# Patient Record
Sex: Female | Born: 1990 | Race: White | Hispanic: No | Marital: Single | Smoking: Never smoker
Health system: Southern US, Community
[De-identification: ages and names within clinical notes are randomized; demographics above are authoritative.]

## PROBLEM LIST (undated history)

## (undated) DIAGNOSIS — F419 Anxiety disorder, unspecified: Secondary | ICD-10-CM

## (undated) DIAGNOSIS — R001 Bradycardia, unspecified: Secondary | ICD-10-CM

## (undated) DIAGNOSIS — F32A Depression, unspecified: Secondary | ICD-10-CM

## (undated) DIAGNOSIS — F329 Major depressive disorder, single episode, unspecified: Secondary | ICD-10-CM

## (undated) DIAGNOSIS — T1491XA Suicide attempt, initial encounter: Secondary | ICD-10-CM

## (undated) DIAGNOSIS — E039 Hypothyroidism, unspecified: Principal | ICD-10-CM

## (undated) DIAGNOSIS — R42 Dizziness and giddiness: Secondary | ICD-10-CM

## (undated) DIAGNOSIS — L700 Acne vulgaris: Principal | ICD-10-CM

## (undated) DIAGNOSIS — E063 Autoimmune thyroiditis: Secondary | ICD-10-CM

## (undated) DIAGNOSIS — Z789 Other specified health status: Secondary | ICD-10-CM

## (undated) DIAGNOSIS — G43809 Other migraine, not intractable, without status migrainosus: Secondary | ICD-10-CM

## (undated) DIAGNOSIS — H9190 Unspecified hearing loss, unspecified ear: Secondary | ICD-10-CM

## (undated) DIAGNOSIS — Z0000 Encounter for general adult medical examination without abnormal findings: Principal | ICD-10-CM

## (undated) DIAGNOSIS — T753XXA Motion sickness, initial encounter: Principal | ICD-10-CM

## (undated) DIAGNOSIS — M25572 Pain in left ankle and joints of left foot: Secondary | ICD-10-CM

## (undated) DIAGNOSIS — R599 Enlarged lymph nodes, unspecified: Secondary | ICD-10-CM

## (undated) DIAGNOSIS — E7800 Pure hypercholesterolemia, unspecified: Secondary | ICD-10-CM

## (undated) DIAGNOSIS — R739 Hyperglycemia, unspecified: Secondary | ICD-10-CM

## (undated) DIAGNOSIS — R198 Other specified symptoms and signs involving the digestive system and abdomen: Principal | ICD-10-CM

## (undated) HISTORY — DX: Suicide attempt, initial encounter: T14.91XA

## (undated) HISTORY — PX: WISDOM TOOTH EXTRACTION: SHX21

---
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
## 2012-04-01 ENCOUNTER — Inpatient Hospital Stay: Admit: 2012-04-01 | Discharge: 2012-04-01 | Attending: Emergency Medicine

## 2012-04-01 LAB — BASIC METABOLIC PANEL
BUN: 15 mg/dL (ref 7–20)
CO2: 22 mmol/L (ref 21–32)
Calcium: 8.9 mg/dL (ref 8.3–10.6)
Chloride: 100 mmol/L (ref 99–110)
Creatinine: 0.8 mg/dL (ref 0.6–1.1)
GFR African American: >60 (ref >60)
GFR Non-African American: >60 (ref >60)
Glucose: 133 mg/dL — ABNORMAL HIGH (ref 70–99)
Potassium: 3.6 mmol/L (ref 3.5–5.1)
Sodium: 137 mmol/L (ref 136–145)

## 2012-04-01 LAB — CBC WITH AUTO DIFFERENTIAL
Basophils %: 0.4 %
Basophils Absolute: 0 10*3/uL (ref 0.0–0.2)
Eosinophils %: 0.3 %
Eosinophils Absolute: 0 10*3/uL (ref 0.0–0.6)
Hematocrit: 44.2 % (ref 36.0–48.0)
Hemoglobin: 14.6 g/dL (ref 12.0–16.0)
Lymphocytes %: 3.5 %
Lymphocytes Absolute: 0.4 10*3/uL — ABNORMAL LOW (ref 1.0–5.1)
MCH: 29.1 pg (ref 26.0–34.0)
MCHC: 33 g/dL (ref 31.0–36.0)
MCV: 88.3 fL (ref 80.0–100.0)
MPV: 9.4 fL (ref 5.0–10.5)
Monocytes %: 3.7 %
Monocytes Absolute: 0.4 10*3/uL (ref 0.0–1.3)
Neutrophils %: 92.1 %
Neutrophils Absolute: 11.1 10*3/uL — ABNORMAL HIGH (ref 1.7–7.7)
Platelets: 218 10*3/uL (ref 135–450)
RBC: 5 M/uL (ref 4.00–5.20)
RDW: 12.8 % (ref 12.4–15.4)
WBC: 12.1 10*3/uL — ABNORMAL HIGH (ref 4.0–11.0)

## 2012-04-01 LAB — URINALYSIS
Blood, Urine: NEGATIVE
Glucose, Ur: NEGATIVE mg/dL
Ketones, Urine: 40 mg/dL — AB
Nitrite, Urine: NEGATIVE
Specific Gravity, UA: >=1.03 (ref 1.005–1.030)
Urobilinogen, Urine: 0.2 E.U./dL (ref <2.0)
pH, UA: 5.5 (ref 5.0–8.0)

## 2012-04-01 LAB — MICROSCOPIC URINALYSIS: RBC, UA: NONE SEEN /HPF (ref 0–2)

## 2012-04-01 LAB — HEPATIC FUNCTION PANEL
ALT: 13 U/L (ref 10–40)
AST: 14 U/L — ABNORMAL LOW (ref 15–37)
Albumin: 4.4 g/dL (ref 3.4–5.0)
Alkaline Phosphatase: 51 U/L (ref 40–129)
Bilirubin, Direct: 0.1 mg/dL (ref 0.0–0.3)
Bilirubin, Indirect: 0.3 mg/dL (ref 0.0–1.0)
Total Bilirubin: 0.4 mg/dL (ref 0.0–1.0)
Total Protein: 7.8 g/dL (ref 6.4–8.2)

## 2012-04-01 LAB — PREGNANCY, URINE: HCG(Urine) Pregnancy Test: NEGATIVE

## 2012-04-01 LAB — LIPASE: Lipase: 18 U/L (ref 13.0–60.0)

## 2012-04-01 MED ORDER — ONDANSETRON HCL 4 MG PO TABS
4 MG | ORAL_TABLET | Freq: Four times a day (QID) | ORAL | Status: DC | PRN
Start: 2012-04-01 — End: 2019-07-14

## 2012-04-01 MED ADMIN — ondansetron (ZOFRAN) injection 4 mg: 4 mg | INTRAVENOUS | @ 20:00:00 | NDC 00641607801

## 2012-04-01 MED ADMIN — 0.9 % sodium chloride bolus: 1000 mL | INTRAVENOUS | @ 20:00:00 | NDC 00264780000

## 2012-04-01 MED FILL — ONDANSETRON HCL 4 MG/2ML IJ SOLN: 4 MG/2ML | INTRAMUSCULAR | Qty: 2

## 2012-04-01 NOTE — ED Notes (Signed)
Cut to patient for urine specimen, not enough on first void for culture.    Dena Billet, RN  04/01/12 [phone]

## 2012-04-01 NOTE — ED Notes (Signed)
Fluids to patient, told to take slow sips to make sure she can keep down fluids     Kathie Dike, RN  04/01/12 [phone]

## 2012-04-01 NOTE — Discharge Instructions (Signed)
B.R.A.T. Diet  Your doctor has recommended the B.R.A.T. diet for you or your child until the condition improves. This is often used to help control diarrhea and vomiting symptoms. If you or your child can tolerate clear liquids, you may have:   Bananas.   Rice.   Applesauce.   Toast (and other simple starches such as crackers, potatoes, noodles).  Be sure to avoid dairy products, meats, and fatty foods until symptoms are better. Fruit juices such as apple, grape, and prune juice can make diarrhea worse. Avoid these. Continue this diet for 2 days or as instructed by your caregiver.  Document Released: 12/23/2004 Document Revised: 03/17/2011 Document Reviewed: 06/11/2006  [facility] Patient Information 2013 [place].    Diarrhea  Infections caused by germs (bacterial) or a virus commonly cause diarrhea. Your caregiver has determined that with time, rest and fluids, the diarrhea should improve. In general, eat normally while drinking more water than usual. Although water may prevent dehydration, it does not contain salt and minerals (electrolytes). Broths, weak tea without caffeine and oral rehydration solutions (ORS) replace fluids and electrolytes.  Small amounts of fluids should be taken frequently. Large amounts at one time may not be tolerated. Plain water may be harmful in infants and the elderly. Oral rehydrating solutions (ORS) are available at pharmacies and grocery stores. ORS replace water and important electrolytes in proper proportions. Sports drinks are not as effective as ORS and may be harmful due to sugars worsening diarrhea.   ORS is especially recommended for use in children with diarrhea. As a general guideline for children, replace any new fluid losses from diarrhea and/or vomiting with ORS as follows:   If your child weighs 22 pounds or under (10 kg or less), give 60-120 mL ( -  cup or 2 - 4 ounces) of ORS for each episode of diarrheal stool or vomiting episode.   If your child  weighs more than 22 pounds (more than 10 kgs), give 120-240 mL ( - 1 cup or 4 - 8 ounces) of ORS for each diarrheal stool or episode of vomiting.   While correcting for dehydration, children should eat normally. However, foods high in sugar should be avoided because this may worsen diarrhea. Large amounts of carbonated soft drinks, juice, gelatin desserts and other highly sugared drinks should be avoided.   After correction of dehydration, other liquids that are appealing to the child may be added. Children should drink small amounts of fluids frequently and fluids should be increased as tolerated. Children should drink enough fluids to keep urine clear or pale yellow.   Adults should eat normally while drinking more fluids than usual. Drink small amounts of fluids frequently and increase as tolerated. Drink enough fluids to keep urine clear or pale yellow. Broths, weak decaffeinated tea, lemon lime soft drinks (allowed to go flat) and ORS replace fluids and electrolytes.   Avoid:   Carbonated drinks.   Juice.   Extremely hot or cold fluids.   Caffeine drinks.   Fatty, greasy foods.   Alcohol.   Tobacco.   Too much intake of anything at one time.   Gelatin desserts.   Probiotics are active cultures of beneficial bacteria. They may lessen the amount and number of diarrheal stools in adults. Probiotics can be found in yogurt with active cultures and in supplements.   Wash hands well to avoid spreading bacteria and virus.   Anti-diarrheal medications are not recommended for infants and children.   Only take over-the-counter or  prescription medicines for pain, discomfort or fever as directed by your caregiver. Do not give aspirin to children because it may cause Reye's Syndrome.   For adults, ask your caregiver if you should continue all prescribed and over-the-counter medicines.   If your caregiver has given you a follow-up appointment, it is very important to keep that appointment. Not keeping the  appointment could result in a chronic or permanent injury, and disability. If there is any problem keeping the appointment, you must call back to this facility for assistance.  SEEK IMMEDIATE MEDICAL CARE IF:    You or your child is unable to keep fluids down or other symptoms or problems become worse in spite of treatment.   Vomiting or diarrhea develops and becomes persistent.   There is vomiting of blood or bile (green material).   There is blood in the stool or the stools are black and tarry.   There is no urine output in 6-8 hours or there is only a small amount of very dark urine.   Abdominal pain develops, increases or localizes.   You have a fever.   Your baby is older than 3 months with a rectal temperature of 102 F (38.9 C) or higher.   Your baby is 25 months old or younger with a rectal temperature of 100.4 F (38 C) or higher.   You or your child develops excessive weakness, dizziness, fainting or extreme thirst.   You or your child develops a rash, stiff neck, severe headache or become irritable or sleepy and difficult to awaken.  MAKE SURE YOU:    Understand these instructions.   Will watch your condition.   Will get help right away if you are not doing well or get worse.  Document Released: 12/13/2001 Document Revised: 03/17/2011 Document Reviewed: 10/30/2008  [facility] Patient Information 2013 [place].    Nausea and Vomiting  Nausea is a sick feeling that often comes before throwing up (vomiting). Vomiting is a reflex where stomach contents come out of your mouth. Vomiting can cause severe loss of body fluids (dehydration). Children and elderly adults can become dehydrated quickly, especially if they also have diarrhea. Nausea and vomiting are symptoms of a condition or disease. It is important to find the cause of your symptoms.  CAUSES    Direct irritation of the stomach lining. This irritation can result from increased acid production (gastroesophageal reflux disease),  infection, food poisoning, taking certain medicines (such as nonsteroidal anti-inflammatory drugs), alcohol use, or tobacco use.   Signals from the brain.These signals could be caused by a headache, heat exposure, an inner ear disturbance, increased pressure in the brain from injury, infection, a tumor, or a concussion, pain, emotional stimulus, or metabolic problems.   An obstruction in the gastrointestinal tract (bowel obstruction).   Illnesses such as diabetes, hepatitis, gallbladder problems, appendicitis, kidney problems, cancer, sepsis, atypical symptoms of a heart attack, or eating disorders.   Medical treatments such as chemotherapy and radiation.   Receiving medicine that makes you sleep (general anesthetic) during surgery.  DIAGNOSIS  Your caregiver may ask for tests to be done if the problems do not improve after a few days. Tests may also be done if symptoms are severe or if the reason for the nausea and vomiting is not clear. Tests may include:   Urine tests.   Blood tests.   Stool tests.   Cultures (to look for evidence of infection).   X-rays or other imaging studies.  Test results can  help your caregiver make decisions about treatment or the need for additional tests.  TREATMENT  You need to stay well hydrated. Drink frequently but in small amounts.You may wish to drink water, sports drinks, clear broth, or eat frozen ice pops or gelatin dessert to help stay hydrated.When you eat, eating slowly may help prevent nausea.There are also some antinausea medicines that may help prevent nausea.  HOME CARE INSTRUCTIONS    Take all medicine as directed by your caregiver.   If you do not have an appetite, do not force yourself to eat. However, you must continue to drink fluids.   If you have an appetite, eat a normal diet unless your caregiver tells you differently.   Eat a variety of complex carbohydrates (rice, wheat, potatoes, bread), lean meats, yogurt, fruits, and vegetables.   Avoid  high-fat foods because they are more difficult to digest.   Drink enough water and fluids to keep your urine clear or pale yellow.   If you are dehydrated, ask your caregiver for specific rehydration instructions. Signs of dehydration may include:   Severe thirst.   Dry lips and mouth.   Dizziness.   Dark urine.   Decreasing urine frequency and amount.   Confusion.   Rapid breathing or pulse.  SEEK IMMEDIATE MEDICAL CARE IF:    You have blood or brown flecks (like coffee grounds) in your vomit.   You have black or bloody stools.   You have a severe headache or stiff neck.   You are confused.   You have severe abdominal pain.   You have chest pain or trouble breathing.   You do not urinate at least once every 8 hours.   You develop cold or clammy skin.   You continue to vomit for longer than 24 to 48 hours.   You have a fever.  MAKE SURE YOU:    Understand these instructions.   Will watch your condition.   Will get help right away if you are not doing well or get worse.  Document Released: 12/23/2004 Document Revised: 03/17/2011 Document Reviewed: 05/22/2010  [facility] Patient Information 2013 [place].

## 2012-04-01 NOTE — ED Notes (Signed)
Pt arrives to our ED for vomiting since 11 a.m. This morning. Pt's last period was 2 weeks ago. Pt denies abd pain.     Lockie Mola, RN  04/01/12 [phone]

## 2012-04-01 NOTE — ED Notes (Signed)
Pt has nausea, no further emesis, reports she feel like she need to have a bowel movement.     Kathie Dike, RN  04/01/12 [phone]

## 2012-04-01 NOTE — ED Provider Notes (Signed)
[facility]     eMERGENCY dEPARTMENT eNCOUnter   [facility]      Pt Name: Vickie Wallace  MRN: <[id number]>  Birthdate 10-30-1990  Date of evaluation: 04/01/2012    CHIEF COMPLAINT     Emesis    Nursing Notes, Past Medical Hx, Past Surgical Hx, Social Hx, Allergies, and Family Hx were all reviewed and agreed with, or any disagreements were addressed in the HPI.    HISTORY OF PRESENT ILLNESS     Vickie Wallace is a 22 y.o. female who presents to the emergency department with complaints of vomiting and diarrhea.  Patient states her symptoms began this morning approximately 11 AM.  She states she has not been able to keep anything down.  She reports feeling nauseated and states she has had several episodes of non-bloody, non-bilious emesis and several episodes of watery diarrhea.  She denies having any abdominal pain.  She denies any fevers.  No urinary symptoms.  No sick contacts or recent travel.  No recent antibiotic use.    REVIEW OF SYSTEMS       Constitutional: no fevers  Cardiac: no recurring substernal pressure;    Respiratory: no shortness of breath;   Gastrointestinal: no abdominal pain; + nausea, + vomiting, + diarrhea   Genitourinary: no dysuria; no frequency;     "Remaining review of systems reviewed and negative. I have reviewed the nursing triage documentation and agree unless otherwise noted below."     PAST MEDICAL HISTORY     She has a past medical history of Anxiety.    SURGICAL HISTORY       She has no past surgical history on file.    CURRENT MEDICATIONS       Previous Medications    CITALOPRAM HYDROBROMIDE (CELEXA PO)    Take  by mouth.       ALLERGIES       She  has no known allergies.    FAMILY HISTORY       She has no family status information on file.    She has a family history is not on file.    SOCIAL HISTORY       She reports that she has never smoked. She does not have any smokeless tobacco history on file. She reports that she drinks about 0.6  ounces of alcohol per week. She reports that she does not use illicit drugs.    PHYSICAL EXAM       INITIAL VITALS: BP 127/73   Pulse 95   Temp(Src) 99.4 ??F (37.4 ??C) (Oral)   Resp 18   Ht 5\' 5"  (1.651 m)   Wt 63.504 kg (140 lb)   BMI 23.3 kg/m2   SpO2 100%    Constitutional: Well-developed female actively vomiting.   HENT:  Head is atraumatic. Mucous membranes are moist  Eyes:  Pupils are equal and reactive to light. No scleral icterus or remarkable injection.  Conjunctivae pink.  Neck: Normal range of motion.   Cardiovascular:  Normal heart rate.  Normal rhythm. No murmurs, No rubs. No gallops.  Pulmonary/Chest:  Normal breath sounds. No respiratory distress. No wheezing.   Abdomen:  Soft, No tenderness. There is no guarding or rebound tenderness. Bowel sounds normal. No masses.   Back:  There is no restriction in range of motion. There is no swelling; redness; or CVA tenderness. The spine is non tender.   Extremities:  Normal range of motion. Intact distal pulses symmetric and equal upper  vs lower.  No pedal edema.  No tenderness. Capillary refill is two seconds.  Neurologic:  The patient is awake and alert. No focal central or lateralizing neuro deficits.  Skin:  Warm; Adequate skin turgor. No erythema. No rash  Psychiatric: Affect normal    DIAGNOSTIC RESULTS     RADIOLOGY:   Not Indicated    LABS:   Labs Reviewed   URINALYSIS - Abnormal; Notable for the following:     Clarity, UA SL CLOUDY (*)     Bilirubin Urine SMALL (*)     Ketones, Urine 40 (*)     Protein, UA TRACE (*)     Leukocyte Esterase, Urine SMALL (*)     All other components within normal limits   CBC WITH AUTO DIFFERENTIAL - Abnormal; Notable for the following:     WBC 12.1 (*)     Neutrophils Absolute 11.1 (*)     Lymphocytes Absolute 0.4 (*)     All other components within normal limits   BASIC METABOLIC PANEL - Abnormal; Notable for the following:     Glucose 133 (*)     All other components within normal limits   HEPATIC FUNCTION PANEL -  Abnormal; Notable for the following:     AST 14 (*)     All other components within normal limits   MICROSCOPIC URINALYSIS - Abnormal; Notable for the following:     WBC, UA 10-20 (*)     Bacteria, UA 4+ (*)     All other components within normal limits   URINE CULTURE   PREGNANCY, URINE   LIPASE     EMERGENCY DEPARTMENT COURSE:     4:47 PM Patient sitting up comfortably.  Reports feeling better.  Requesting something to drink.  Provided a po trial.  States her nausea is improved.  She has not had any further episodes of vomiting or diarrhea.  Her abdomen remains soft and non-surgical.  Her tachycardia has resolved IV fluids.  She is noted to have slight leukocytosis and this is likely an acute phase reactant from her vomiting and diarrhea.  I have low suspicion for acute abdominal pathology such as acute appendicitis, cholecystitis, or pancreatitis.  She is advised to follow a bland diet and keep herself well-hydrated.  She is advised to return for any worsening or concerning symptoms.    DIFFERENTIAL DIAGNOSIS/ MDM:     Based on the patient's history, physical exam, and associated clinical data, the patient presents to the emergency room with complaints of vomiting and diarrhea.  Will obtain labs, urinalysis.  Will provide IV fluids, Zofran.      FINAL IMPRESSION:       1. Nausea & vomiting    2. Diarrhea          DISPOSITION/PLAN:     DISPOSITION Decision to Discharge    PATIENT REFERRED TO:  [facility]  [address]  [place] [address]  [phone]            DISCHARGE MEDICATIONS:  New Prescriptions    ONDANSETRON (ZOFRAN) 4 MG TABLET    Take 1 tablet by mouth every 6 hours as needed for Nausea.       (Please note that portions of this note were completed with a voice recognition program.  Efforts were made to edit the dictations but occasionally words are mis-transcribed.)        Valera Castle, MD  04/01/12 [phone]

## 2012-04-01 NOTE — ED Notes (Signed)
Pt resting, no further emesis. Warm blanket for comfort.     Dena Billet, RN  04/01/12 [id number]

## 2016-09-30 DIAGNOSIS — T43222A Poisoning by selective serotonin reuptake inhibitors, intentional self-harm, initial encounter: Secondary | ICD-10-CM

## 2016-09-30 NOTE — Psychotherapy (Signed)
PES Accept Note:    This provider contacted and has accepted Pt for PES transfer pending the following:    -Completion of DPIC monitoring to 4am   -no significant changes in vitals  -no significant mental status changes    Briefly, Ms Macdonnell is a 26 y/o F, hist of depression and anxiety who presents to ED intoxicated and took 10 pills of lexapro in presumed suicide attempt. Reportedly vomited majority of ingestion.    Nicholaus Bloom Psych OD

## 2016-09-30 NOTE — Unmapped (Signed)
ED Attending Attestation Note    Date of service:  09/30/2016     This patient was seen by the resident physician.  I have seen and examined the patient, agree with the workup, evaluation, management and diagnosis. The care plan has been discussed and I concur.  I have reviewed the ECG and concur with the resident's interpretation.    My assessment reveals a 26 y.o. female with intentional drug overdose on SSRI.  Reportedly took 10 tablets, but vomited up 8.  Normal neurological exam and is hemodynamically stable.  After a period of observation, she will require evaluation at [facility] as she is on a 72 hour hold per PD.

## 2016-09-30 NOTE — ED Notes (Signed)
PSW notified for patient in A-1

## 2016-09-30 NOTE — ED Provider Notes (Signed)
[place] [place] ED Note    Date of Service: 09/30/2016    Reason for Visit: Overdose-Intentional      Patient History     HPI:  This is a 26 y.o. female with history of Anxiety and depression who presents with intentional overdose.  Per the patient, she ingested 10 tablets of citalopram 20 mg at approximately 2200 this evening in an attempt at self-harm.  Patient states that she threw up approximately 8 of the tablets within 5 minutes due to nausea.  Patient denies concomitant intoxicants or ingestion.  Patient endorses ongoing suicidal ideation, stating that she would like to leave the hospital as this is really triggering my anxiety.    No past medical history on file.    No past surgical history on file.    Julie Short  has no tobacco, alcohol, and drug history on file.    Patient's Medications    No medications on file       Allergies:   Allergies as of 09/30/2016    (Not on File)       PMH: Nursing notes reviewed   PSH: Nursing notes reviewed   FH: Nursing notes reviewed   MEDS: Nursing notes and chart reviewed     Review of Systems     ROS: A full, ten-point review of systems was performed. Notable findings per HPI. All other pertinent systems reviewed and negative.      Physical Exam     BP (!) 137/93 (BP Location: Left arm, Patient Position: Sitting)   Pulse 71   Temp 97.9 F (36.6 C) (Oral)   Resp 16   SpO2 97%       General:  Tearful, somewhat disheveled adult woman in no acute distress  HEENT:  Normocephalic, atraumatic; extraocular movements intact; moist mucous membranes with evidence of chewing tobacco contained within the lower lip  Neck:  Neck supple, trachea midline  Pulmonary: Normal work of breathing, no respiratory distress  Cardiac:  Regular rate and rhythm with no murmurs, rubs, or gallops   Abdomen:  Soft, nondistended  Musculoskeletal:  Atraumatic exam with no focal swelling or tenderness, no gross deformity, no peripheral clubbing,  cyanosis, or edema  Vascular:  2+ peripheral pulses in bilateral upper and lower extremities   Skin:  Warm and well perfused without rashes or lesions   Neuro:  Alert and interactive, strength and sensation grossly intact  Psych:  Tearful, with active, ongoing suicidal ideation      Diagnostic Studies     Labs:  Lab results reviewed - please see Epic for full details.    Radiology:  No orders to display       EKG:    EKG Interpretation    Interpreted by emergency department physician    Rhythm: normal sinus   Rate: normal  Axis: normal  Ectopy: none  Conduction: normal  ST Segments: no acute change  T Waves: no acute change  Q Waves: none    Clinical Impression: Normal sinus rhythm with no overt ischemic changes or repolarization abnormalities (to include evidence of QTC prolongation), no prior studies available for comparison    Julie Short      Emergency Department Procedures       ED Course and MDM     Julie Short is a 26 y.o. female with a history and presentation as described above in HPI.  The patient was evaluated by myself and the ED Attending Physician, Dr. Ellis Savage. All management and disposition  plans were discussed and agreed upon.    Following a thorough examination, blood work, urine studies, and electrocardiography were ordered to assess for possible manifestation of toxicologic exposure.  The patient was also discussed with the [place] [facility], who recommended that she be observed for approximately 6 hours following the time of exposure to ascertain clinical clearance from any kind of toxicologic pathology.    Patient was subsequently observed in stable condition for an appropriate amount of time pending clinical clearance of her presumed serotonergic toxidrome.    Following both diagnostic and clinical clearance in conjunction with an appropriate period of observation, the patient was suddenly felt to be stable for transfer to facilitate emergent  psychiatric evaluation.  The patient was subsequent discussed with the on-call physician at psychiatric emergency services, Dr. Boone Master, who expressed understanding of and agreement with the patient's need for transfer.      Impression     1. Intentional drug overdose, initial encounter (CMS Dx)    2. Overdose, intentional self-harm, initial encounter (CMS Dx)           Julie Short, M.D., PGY-3   [facility]     Julie Donna, MD  Resident  10/01/16 [phone]

## 2016-09-30 NOTE — Unmapped (Signed)
Patient presents to ED after reports of Intentional overdose, Patient took 10 Lexapro in attempt to harm herself.

## 2016-10-01 ENCOUNTER — Inpatient Hospital Stay
Admit: 2016-10-01 | Discharge: 2016-10-01 | Disposition: A | Discharge status: Home / Self Care | Payer: PRIVATE HEALTH INSURANCE

## 2016-10-01 ENCOUNTER — Inpatient Hospital Stay
Admit: 2016-10-01 | Discharge: 2016-10-01 | Disposition: A | Discharge status: Other Acute Inpatient Hospital | Payer: PRIVATE HEALTH INSURANCE

## 2016-10-01 DIAGNOSIS — F329 Major depressive disorder, single episode, unspecified: Secondary | ICD-10-CM

## 2016-10-01 LAB — URINE DRUG SCREEN WITHOUT CONFIRMATION, STAT
Amphetamine, 500 ng/mL Cutoff: NEGATIVE
Barbiturates UR, 300  ng/mL Cutoff: NEGATIVE
Benzodiazepines UR, 300 ng/mL Cutoff: NEGATIVE
Buprenorphine, 5 ng/mL Cutoff: NEGATIVE
Cocaine UR, 300 ng/mL Cutoff: NEGATIVE
Fentanyl, 2 ng/mL Cutoff: NEGATIVE
Methadone, UR, 300 ng/mL Cutoff: NEGATIVE
Opiates UR, 300 ng/mL Cutoff: NEGATIVE
Oxycodone, 100 ng/mL Cutoff: NEGATIVE
THC UR, 50 ng/mL Cutoff: NEGATIVE
Tricyclic Antidepressants, 300 ng/mL Cutoff: NEGATIVE

## 2016-10-01 LAB — URINALYSIS-MACROSCOPIC W/REFLEX TO MICROSCOPIC
Bilirubin, UA: NEGATIVE
Blood, UA: NEGATIVE
Glucose, UA: NEGATIVE mg/dL
Ketones, UA: NEGATIVE mg/dL
Leukocytes, UA: NEGATIVE
Nitrite, UA: NEGATIVE
Protein, UA: NEGATIVE mg/dL
Specific Gravity, UA: 1.015 (ref 1.005–1.035)
Urobilinogen, UA: 0.2 EU/dL (ref 0.2–1.0)
pH, UA: 6.5 (ref 5.0–8.0)

## 2016-10-01 LAB — BASIC METABOLIC PANEL
Anion Gap: 11 mmol/L (ref 3–16)
BUN: 10 mg/dL (ref 7–25)
CO2: 26 mmol/L (ref 21–33)
Calcium: 9.4 mg/dL (ref 8.6–10.3)
Chloride: 103 mmol/L (ref 98–110)
Creatinine: 0.83 mg/dL (ref 0.60–1.30)
Glucose: 110 mg/dL (ref 70–100)
Osmolality, Calculated: 290 mOsm/kg (ref 278–305)
Potassium: 4 mmol/L (ref 3.5–5.3)
Sodium: 140 mmol/L (ref 133–146)
eGFR AA CKD-EPI: >90 See note.
eGFR NONAA CKD-EPI: >90 See note.

## 2016-10-01 LAB — CBC
Hematocrit: 43.3 % (ref 35.0–45.0)
Hemoglobin: 15.2 g/dL (ref 11.7–15.5)
MCH: 33 pg (ref 27.0–33.0)
MCHC: 35.1 g/dL (ref 32.0–36.0)
MCV: 94 fL (ref 80.0–100.0)
MPV: 8.5 fL (ref 7.5–11.5)
Platelets: 219 10E3/uL (ref 140–400)
RBC: 4.61 10E6/uL (ref 3.80–5.10)
RDW: 13.4 % (ref 11.0–15.0)
WBC: 6.7 10E3/uL (ref 3.8–10.8)

## 2016-10-01 LAB — VENOUS BLOOD GAS, LINE/SYRINGE
%HBO2-Line Draw: 44.1 % (ref 40.0–70.0)
Base Excess-Line Draw: 2 mmol/L (ref <=3.0)
CO2 Content-Line Draw: 30 mmol/L (ref 25–29)
Carboxyhgb-Line Draw: 1.1 % (ref 0.0–2.0)
HCO3-Line Draw: 28 mmol/L (ref 24–28)
Methemoglobin-Line Draw: 0.6 % (ref 0.0–1.5)
PCO2-Line Draw: 50 mm Hg (ref 41–51)
PH-Line Draw: 7.37 (ref 7.32–7.42)
PO2-Line Draw: 28 mm Hg (ref 25–40)
Reduced Hemoglobin-Line Draw: 54.2 % (ref 0.0–5.0)

## 2016-10-01 LAB — ETHANOL, SERUM: Ethanol: 214 mg/dL (ref 0–10)

## 2016-10-01 LAB — HEPATIC FUNCTION PANEL
ALT: 44 U/L (ref 7–52)
AST: 22 U/L (ref 13–39)
Albumin: 4.5 g/dL (ref 3.5–5.7)
Alkaline Phosphatase: 49 U/L (ref 36–125)
Bilirubin, Direct: 0.1 mg/dL (ref 0.0–0.4)
Bilirubin, Indirect: 0.2 mg/dL (ref 0.0–1.1)
Total Bilirubin: 0.3 mg/dL (ref 0.0–1.5)
Total Protein: 7.8 g/dL (ref 6.4–8.9)

## 2016-10-01 LAB — DIFFERENTIAL
Basophils Absolute: 47 /uL (ref 0–200)
Basophils Relative: 0.7 % (ref 0.0–1.0)
Eosinophils Absolute: 201 /uL (ref 15–500)
Eosinophils Relative: 3 % (ref 0.0–8.0)
Lymphocytes Absolute: 1749 /uL (ref 850–3900)
Lymphocytes Relative: 26.1 % (ref 15.0–45.0)
Monocytes Absolute: 436 /uL (ref 200–950)
Monocytes Relative: 6.5 % (ref 0.0–12.0)
Neutrophils Absolute: 4268 /uL (ref 1500–7800)
Neutrophils Relative: 63.7 % (ref 40.0–80.0)
nRBC: 0 /100{WBCs} (ref 0–0)

## 2016-10-01 LAB — MAGNESIUM: Magnesium: 2.2 mg/dL (ref 1.5–2.5)

## 2016-10-01 LAB — LIPASE: Lipase: 27 U/L (ref 4–82)

## 2016-10-01 LAB — SALICYLATE LEVEL: Salicylate Lvl: <3 mg/dL (ref 10–30)

## 2016-10-01 LAB — ACETAMINOPHEN LEVEL: Acetaminophen Level: <10 ug/mL — ABNORMAL LOW (ref 10–30)

## 2016-10-01 LAB — PHOSPHORUS: Phosphorus: 4.3 mg/dL (ref 2.1–4.7)

## 2016-10-01 MED ORDER — ibuprofen (ADVIL,MOTRIN) tablet 800 mg
400 | Freq: Once | ORAL | Status: AC
Start: 2016-10-01 — End: 2016-10-01
  Administered 2016-10-01: 05:00:00 800 mg via ORAL

## 2016-10-01 MED FILL — IBUPROFEN 400 MG TABLET: 400 400 MG | ORAL | Qty: 2

## 2016-10-01 NOTE — Unmapped (Signed)
SW Note: SW met with pt who appears at baseline with no overt behaviors. She states this sucks and she messed up real bad. The pt states she regrets attempting to overdose and immediately threw up after taking the Lexapro. She states she can't put her finger on why she did this, adding it was impulsive. The pt had been with a friend earlier and was drinking. She states she drank about a bottle of wine. She denies SI and contracts for safety. The pt denies any previous attempts or previous thoughts of suicide. There's no one in her family that attempted or completed a suicide. She has been in therapy before in HS for feeling anxious, but  vague about any other reasons.     At this time the pt appears appropriate for discharge. She denies SI and contracts for safety. She will either have he friend [facility] stay with her or she'll stay with [facility]. SW provided resources for suicide prevention and [facility].     There are no further SW needs at this time.

## 2016-10-01 NOTE — Unmapped (Signed)
Collaboration/Consultation:  Initiated contact with [facility] for [facility].  [facility] on their [facility] this [facility].

## 2016-10-01 NOTE — Unmapped (Signed)
[facility] Psychiatric Emergency  Service Evaluation    Reason for Visit/Chief Complaint: Overdose-Intentional      Patient History     HPI Julie Short is a 26 y/o with a history of anxiety and depression, BIB [facility] on EMS to [facility] after drinking ETOH and taking ~10 pills of lexapro and texting her boyfriend about it. She immediately after induced vomiting. Either her friend or boyfriend called police.    On interview pt is linear, goal directed, affect is constricted and appropraite to situation. She regrets taking the pills, stating that she doesn't know why she would do something like that, she has never before and does not plan to harm herself. Remembers being in an small argument with her boyfriend Julie Short, but nothing too serious. No prior suicide or self harm attempts. Does not have access to guns. Lives in [place] by self, but willing to stay with friends living close. Future oriented, plans to go to therapist, who she recently started with, and talk to PCP about increasing her medications. Not particularly close with family, father deceased, mother lives in [place] and they rarely talk. Denies current SI/HI or AVH. M    States that she has been depressed on and off over the past 1-2 years and was started on Lexapro. Recently started seeing therapist through [facility]. Pt is very calm and deneis any hx of BAD symptoms. 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  She typically drinks a glass of wine every other day but last night drank a bottle. She appears to understand the seriousness of this issue but does not seem to think anything is going on that is so bad that would lead her to this behavior. She denies she had an SI leading up to this issue. She did text the BF before she did this, making it likely this was a tool to manipulate him in some way.          PES Triage Screening:  Broset score:             PSS- Safety Screen Score: 1  Suicide Screen: Is patient  expressing suicidal ideations?: No    Is Admission due to self harm?: No    Has Patient Attempted Suicide or Self Harm in past 72 hours?: No    Is Patient experiencing acute agitation, anxiety or insomnia?  : No    Context: stress and intoxication  Location: Altered mental status of mood  Duration: 1 days.  Severity: moderate .  Associated Symptoms: mild.  Modifying Factors: intoxication .  Timing: Constant.    ??  Past Psychiatric History:     Hospitalizations: no.    Past suicide attempts: no.    History of violence: no.  ??  Substance Use History: ETOH use, binge drinks occasionally, not a regular drinker, though perhaps minimizing. Denies THC, Cocaine, Heroin specifically    PMH:       Past Medical History:   Diagnosis Date   ??? Depression    ??? Obsessive-compulsive disorder      I have reviewed the past medical history.  Additional history obtained: yes    Social History:    Work History:  Psychologist, forensic. Not close with famil but has good friends and Bf    Social History     Social History   ??? Marital status: Divorced     Spouse name: N/A   ??? Number of children: N/A   ???  Years of education: N/A     Social History Main Topics   ??? Smoking status: Never Smoker   ??? Smokeless tobacco: Current User     Types: Chew   ??? Alcohol use 1.2 oz/week     2 Glasses of wine per week      Comment: social, every other day    ??? Drug use: Unknown      Comment: denies   ??? Sexual activity: Not Asked     Other Topics Concern   ??? Caffeine Use Yes   ??? Occupational Exposure No   ??? Exercise No   ??? Seat Belt Yes     Social History Narrative   ??? None     I have reviewed the past social history.  Additional history obtained: yes.    Family History:    Family History   Problem Relation Age of Onset   ??? Family history unknown: Yes     I have reviewed the past family history.  Additional history obtained: yes.    Medications:  Previous Medications    No medications on file       Allergies:   Allergies as of 10/01/2016   ??? (No Known Allergies)        Review of Systems     Review of Systems   Constitutional: Negative for activity change, appetite change, weight gain and weight loss.   Eyes: Negative for visual disturbance.   Respiratory: Negative for shortness of breath.    Cardiovascular: Negative for chest pain and palpitations.   Gastrointestinal: Negative for constipation, diarrhea, nausea and vomiting.         Physical Exam/Objective Data     ED Triage Vitals [10/01/16 0501]   Vital Signs Group      Temp 97.9 ??F (36.6 ??C)      Temp Source Oral      Heart Rate 67      Heart Rate Source Automatic;Left      Resp 16      SpO2 97 %      BP 120/79      MAP (mmHg) 89      BP Location Left arm      BP Method Automatic      Patient Position Sitting   SpO2 97 %   O2 Device None (Room air)       Physical Exam    Mental Status Exam:     Gait and Muscle Strength:  Normal and Muscle strength intact  Appearance and Behavior: Calm, Cooperative and Open Historian                                                  Groomed and NL Body Habitus  Speech: NL articulation, prosody, volume and production  Language: Naming intact  Mood: sl irritable, seems mostly frustrated with herself ofr her behavior and inconvenience for being here  Affect: appropriate to situation  Thought Process and Associations: goal directed and no derailment                                                              No  loose associations  Thought Content: no suicidal/homicidal with plans for the future  Abnormal or psychotic thoughts: None  Orientation: person, place, time/date and situation  Memory: recent, remote, and immediate recall intact  Attention and Concentration: intact  Abstraction: Attention and concentration intact  Fund of Knowledge: average  Insight and Judgement: Partial                                      Fair        Labs:    Please see electronic medical record for any tests performed in the ED.    Recent Results (from the past 24 hour(s))   Urinalysis-Macroscopic w/Rfx to Microsco     Collection Time: 09/30/16 11:32 PM   Result Value Ref Range    Color, UA Yellow Yellow,Straw    Clarity, UA Clear Clear    Specific Gravity, UA 1.015 1.005 - 1.035    pH, UA 6.5 5.0 - 8.0    Protein, UA Negative Negative mg/dL    Glucose, UA Negative Negative mg/dL    Ketones, UA Negative Negative mg/dL    Bilirubin, UA Negative Negative    Blood, UA Negative Negative    Nitrite, UA Negative Negative    Urobilinogen, UA 0.2 E.U./dL 0.2 - 1.0 EU/dL    Leukocytes, UA Negative Negative   Urine Drug Screen, STAT    Collection Time: 09/30/16 11:32 PM   Result Value Ref Range    Amphetamine, 500 ng/mL Cutoff Negative Negative    Barbiturates UR, 300  ng/mL Cutoff Negative Negative    Buprenorphine, 5 ng/mL Cutoff Negative Negative    Benzodiazepines UR, 300 ng/mL Cutoff Negative Negative    Cocaine UR, 300 ng/mL Cutoff Negative Negative    Methadone, UR, 300 ng/mL Cutoff Negative Negative    Opiates UR, 300 ng/mL Cutoff Negative Negative    Oxycodone, 100 ng/mL Cutoff Negative Negative    Tricyclic Antidepressants, 300 ng/mL Cutoff Negative Negative    THC UR, 50 ng/mL Cutoff Negative Negative    Fentanyl, 2 ng/mL Cutoff Negative Negative   Acetaminophen level    Collection Time: 09/30/16 11:35 PM   Result Value Ref Range    Acetaminophen Level <10 (L) 10 - 30 ug/mL   Basic metabolic panel    Collection Time: 09/30/16 11:35 PM   Result Value Ref Range    Sodium 140 133 - 146 mmol/L    Potassium 4.0 3.5 - 5.3 mmol/L    Chloride 103 98 - 110 mmol/L    CO2 26 21 - 33 mmol/L    Anion Gap 11 3 - 16 mmol/L    BUN 10 7 - 25 mg/dL    Creatinine 1.61 0.96 - 1.30 mg/dL    Glucose 045 (H) 70 - 100 mg/dL    Calcium 9.4 8.6 - 40.9 mg/dL    Osmolality, Calculated 290 278 - 305 mOsm/kg    eGFR AA CKD-EPI >90 See note.    eGFR NONAA CKD-EPI >90 See note.   CBC    Collection Time: 09/30/16 11:35 PM   Result Value Ref Range    WBC 6.7 3.8 - 10.8 10E3/uL    RBC 4.61 3.80 - 5.10 10E6/uL    Hemoglobin 15.2 11.7 - 15.5 g/dL    Hematocrit  81.1 91.4 - 45.0 %    MCV 94.0 80.0 - 100.0 fL    MCH 33.0 27.0 - 33.0 pg  MCHC 35.1 32.0 - 36.0 g/dL    RDW 16.1 09.6 - 04.5 %    Platelets 219 140 - 400 10E3/uL    MPV 8.5 7.5 - 11.5 fL   Differential    Collection Time: 09/30/16 11:35 PM   Result Value Ref Range    Neutrophils Relative 63.7 40.0 - 80.0 %    Lymphocytes Relative 26.1 15.0 - 45.0 %    Monocytes Relative 6.5 0.0 - 12.0 %    Eosinophils Relative 3.0 0.0 - 8.0 %    Basophils Relative 0.7 0.0 - 1.0 %    nRBC 0 0 - 0 /100 WBC    Neutrophils Absolute 4,268 1,500 - 7,800 /uL    Lymphocytes Absolute 1,749 850 - 3,900 /uL    Monocytes Absolute 436 200 - 950 /uL    Eosinophils Absolute 201 15 - 500 /uL    Basophils Absolute 47 0 - 200 /uL   ETOH, Ethanol Serum    Collection Time: 09/30/16 11:35 PM   Result Value Ref Range    Ethanol 214 (H) 0 - 10 mg/dL   Venous Blood Gas, Line/Syringe    Collection Time: 09/30/16 11:35 PM   Result Value Ref Range    PH-Line Draw 7.37 7.32 - 7.42    PCO2-Line Draw 50 41 - 51 mm Hg    PO2-Line Draw 28 25 - 40 mm Hg    HCO3-Line Draw 28 24 - 28 mmol/L    CO2 Content-Line Draw 30 (H) 25 - 29 mmol/L    Base Excess-Line Draw 2.0 -2.0 - 3.0 mmol/L    %HBO2-Line Draw 44.1 40.0 - 70.0 %    Carboxyhgb-Line Draw 1.1 0.0 - 2.0 %    Methemoglobin-Line Draw 0.6 0.0 - 1.5 %    Reduced Hemoglobin-Line Draw 54.2 (H) 0.0 - 5.0 %   Hepatic Function Panel    Collection Time: 09/30/16 11:35 PM   Result Value Ref Range    Total Bilirubin 0.3 0.0 - 1.5 mg/dL    Bilirubin, Direct 0.1 0.0 - 0.4 mg/dL    AST 22 13 - 39 U/L    ALT 44 7 - 52 U/L    Alkaline Phosphatase 49 36 - 125 U/L    Total Protein 7.8 6.4 - 8.9 g/dL    Albumin 4.5 3.5 - 5.7 g/dL    Bilirubin, Indirect 0.2 0.0 - 1.1 mg/dL   Lipase    Collection Time: 09/30/16 11:35 PM   Result Value Ref Range    Lipase 27 4 - 82 U/L   Magnesium    Collection Time: 09/30/16 11:35 PM   Result Value Ref Range    Magnesium 2.2 1.5 - 2.5 mg/dL   Phosphorus    Collection Time: 09/30/16 11:35 PM    Result Value Ref Range    Phosphorus 4.3 2.1 - 4.7 mg/dL   Salicylate level    Collection Time: 09/30/16 11:35 PM   Result Value Ref Range    Salicylate Lvl <3 (L) 10 - 30 mg/dL       Radiology and EKG:  No results found.    EKG: Please see electronic medical record for any studies performed in the ED.    Emergency Course and Plan     Julie Short is a 26 y.o. female who presented to the emergency department with Overdose-Intentional    who is now regretful, denying suicide attempt but rather way of getting attention. Concern for protracted depressive episode. Pt well connected to PCP  and therapy services, and future oriented. Collateral contacted by [facility] PES reassuring. Safe D/C    Pt was seen by resident, PA, SW and attending, [place], all in agreement for DC. Pt should continue to see PCP for meds and her therapist. It is unclear why she at 48 for the first time had such dramatic behavior, although she was intox on etoh at the time, no hx of mental illness more than occasional depression. She is not manic, not psychotic, denies si/hi or plans at this time. She regretted hr behavior as soon as she did it, intentionally vomiting up the pills. She is future oriented and wanting to get her phone to call work as she is no show at this point this morning.   ??  Diagnosis: MDD, current depressive episode  ??  Primary psychiatric Diagnosis: depression, nos  Other psychiatric Diagnoses: none   Substance Use Diagnoses: ETOh use disorder?  Medical Diagnoses: none      Disposition:      Discharged from the ED. See AVS for prescriptions, followup, and discharge instructions.  No emergency medical condition present at discharge.  Patient not deemed to be an imminent threat of harm to self or others.  Patient has a good safety plan and discharge disposition in place. Protective factors: Support System and Connected to [employer].   Summary of rationale for disposition: fair.    Provider completing note: Clinical Nurse  Specialist, supervised by sachdeva.    Patient was in OTA.     Patient had a completed Statement of Belief during this encounter:yes  , released by attending physician.    Medications given in PES: no.  Medications prescribed for home or inpatient use: no.  Laboratory work ordered: no.  Other diagnostic studies ordered: no.  Old and/or outside medical records reviewed: no.  Collateral information contacted: yes.  Patient's outside provider contacted: no.         Julie Bonito, PA  10/01/16 [phone]

## 2016-10-01 NOTE — Unmapped (Signed)
[facility] Psychiatric Emergency  Service Evaluation    Reason for Visit/Chief Complaint: Overdose-Intentional      Patient History     HPI  Julie Short is a 26 y/o with a history of anxiety and depression, BIB [facility] on EMS to [facility] after drinking ETOH and taking ~10 pills of lexapro and texting her boyfriend about it. She immediately after induced vomiting. Either her friend or boyfriend called police.    On interview pt is linear, goal directed, affect is constricted and appropraite to situation. She regrets taking the pills, stating that she doesn't know why she would do something like that, she has never before and does not plan to harm herself. Remembers being in an small argument with her boyfriend Beverely Pace, but nothing too serious. No prior suicide or self harm attempts. Does not have access to guns. Lives in [place] by self, but willing to stay with friends living close. Future oriented, plans to go to therapist, who she recently started with, and talk to PCP about increasing her medications. Not particularly close with family, father deceased, mother lives in [place] and they rarely talk. Denies current SI/HI or AVH. M    States that she has been depressed on and off over the past 1-2 years and was started on Lexapro. Recently started seeing therapist through [facility].    PES Triage Screening:  Broset score:             PSS- Safety Screen Score: 1  Suicide Screen: Is patient expressing suicidal ideations?: No    Is Admission due to self harm?: No    Has Patient Attempted Suicide or Self Harm in past 72 hours?: No    Is Patient experiencing acute agitation, anxiety or insomnia?  : No    Context: stress and intoxication  Location: Altered mental status of mood  Duration: 1 days.  Severity: moderate .  Associated Symptoms: mild.  Modifying Factors: intoxication .  Timing: Constant.      Past Psychiatric History:     Hospitalizations: no.    Past suicide attempts: no.    History of violence: no.    Substance  Use History: ETOH use, binge drinks occasionally, not a regular drinker, though perhaps minimizing. Denies THC, Cocaine, Heroin specifically    PMH:       Past Medical History:   Diagnosis Date   ??? Depression    ??? Obsessive-compulsive disorder      I have reviewed the past medical history.  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 Marital status: Divorced     Spouse name: N/A   ??? Number of children: N/A   ??? Years of education: N/A     Social History Main Topics   ??? Smoking status: Never Smoker   ??? Smokeless tobacco: Current User     Types: Chew   ??? Alcohol use 1.2 oz/week     2 Glasses of wine per week      Comment: social, every other day    ??? Drug use: Unknown      Comment: denies   ??? Sexual activity: Not Asked  Other Topics Concern   ??? Caffeine Use Yes   ??? Occupational Exposure No   ??? Exercise No   ??? Seat Belt Yes     Social History Narrative   ??? None     I have reviewed the past social history.  Additional history obtained: no.    Family History:    Family History   Problem Relation Age of Onset   ??? Family history unknown: Yes     I have reviewed the past family history.  Additional history obtained: no.    Medications:  Previous Medications    No medications on file       Allergies:   Allergies as of 10/01/2016   ??? (No Known Allergies)       Review of Systems     Review of Systems      Physical Exam/Objective Data     ED Triage Vitals [10/01/16 0501]   Vital Signs Group      Temp 97.9 ??F (36.6 ??C)      Temp Source Oral      Heart Rate 67      Heart Rate Source Automatic;Left      Resp 16      SpO2 97 %      BP 120/79      MAP (mmHg) 89      BP Location Left arm      BP Method Automatic       Patient Position Sitting   SpO2 97 %   O2 Device None (Room air)       Physical Exam    Mental Status Exam:     Gait and Muscle Strength:  Normal and Muscle strength intact  Appearance and Behavior: Calm, Cooperative and Open Historian      Groomed and NL Body Habitus  Speech: NL articulation, prosody, volume and production  Language: Naming intact  Mood: irritable and dysphoric  Affect: appropriate and constricted  Thought Process and Associations: goal directed and no derailment       No loose associations  Thought Content: no suicidal/homicidal with plans for the future  Abnormal or psychotic thoughts: None  Orientation: person, place, time/date and situation  Memory: recent, remote, and immediate recall intact  Attention and Concentration: intact  Abstraction: Attention and concentration intact  Fund of Knowledge: average  Insight and Judgement: Partial     Fair        Labs:    Please see electronic medical record for any tests performed in the ED.    Recent Results (from the past 24 hour(s))   Urinalysis-Macroscopic w/Rfx to Microsco    Collection Time: 09/30/16 11:32 PM   Result Value Ref Range    Color, UA Yellow Yellow,Straw    Clarity, UA Clear Clear    Specific Gravity, UA 1.015 1.005 - 1.035    pH, UA 6.5 5.0 - 8.0    Protein, UA Negative Negative mg/dL    Glucose, UA Negative Negative mg/dL    Ketones, UA Negative Negative mg/dL    Bilirubin, UA Negative Negative    Blood, UA Negative Negative    Nitrite, UA Negative Negative    Urobilinogen, UA 0.2 E.U./dL 0.2 - 1.0 EU/dL    Leukocytes, UA Negative Negative   Urine Drug Screen, STAT    Collection Time: 09/30/16 11:32 PM   Result Value Ref Range    Amphetamine, 500 ng/mL Cutoff Negative Negative    Barbiturates UR, 300  ng/mL Cutoff Negative  Negative    Buprenorphine, 5 ng/mL Cutoff Negative Negative    Benzodiazepines UR, 300 ng/mL Cutoff Negative Negative    Cocaine UR, 300 ng/mL Cutoff Negative Negative    Methadone, UR, 300 ng/mL Cutoff Negative  Negative    Opiates UR, 300 ng/mL Cutoff Negative Negative    Oxycodone, 100 ng/mL Cutoff Negative Negative    Tricyclic Antidepressants, 300 ng/mL Cutoff Negative Negative    THC UR, 50 ng/mL Cutoff Negative Negative    Fentanyl, 2 ng/mL Cutoff Negative Negative   Acetaminophen level    Collection Time: 09/30/16 11:35 PM   Result Value Ref Range    Acetaminophen Level <10 (L) 10 - 30 ug/mL   Basic metabolic panel    Collection Time: 09/30/16 11:35 PM   Result Value Ref Range    Sodium 140 133 - 146 mmol/L    Potassium 4.0 3.5 - 5.3 mmol/L    Chloride 103 98 - 110 mmol/L    CO2 26 21 - 33 mmol/L    Anion Gap 11 3 - 16 mmol/L    BUN 10 7 - 25 mg/dL    Creatinine 1.61 0.96 - 1.30 mg/dL    Glucose 045 (H) 70 - 100 mg/dL    Calcium 9.4 8.6 - 40.9 mg/dL    Osmolality, Calculated 290 278 - 305 mOsm/kg    eGFR AA CKD-EPI >90 See note.    eGFR NONAA CKD-EPI >90 See note.   CBC    Collection Time: 09/30/16 11:35 PM   Result Value Ref Range    WBC 6.7 3.8 - 10.8 10E3/uL    RBC 4.61 3.80 - 5.10 10E6/uL    Hemoglobin 15.2 11.7 - 15.5 g/dL    Hematocrit 81.1 91.4 - 45.0 %    MCV 94.0 80.0 - 100.0 fL    MCH 33.0 27.0 - 33.0 pg    MCHC 35.1 32.0 - 36.0 g/dL    RDW 78.2 95.6 - 21.3 %    Platelets 219 140 - 400 10E3/uL    MPV 8.5 7.5 - 11.5 fL   Differential    Collection Time: 09/30/16 11:35 PM   Result Value Ref Range    Neutrophils Relative 63.7 40.0 - 80.0 %    Lymphocytes Relative 26.1 15.0 - 45.0 %    Monocytes Relative 6.5 0.0 - 12.0 %    Eosinophils Relative 3.0 0.0 - 8.0 %    Basophils Relative 0.7 0.0 - 1.0 %    nRBC 0 0 - 0 /100 WBC    Neutrophils Absolute 4,268 1,500 - 7,800 /uL    Lymphocytes Absolute 1,749 850 - 3,900 /uL    Monocytes Absolute 436 200 - 950 /uL    Eosinophils Absolute 201 15 - 500 /uL    Basophils Absolute 47 0 - 200 /uL   ETOH, Ethanol Serum    Collection Time: 09/30/16 11:35 PM   Result Value Ref Range    Ethanol 214 (H) 0 - 10 mg/dL   Venous Blood Gas, Line/Syringe    Collection Time: 09/30/16 11:35  PM   Result Value Ref Range    PH-Line Draw 7.37 7.32 - 7.42    PCO2-Line Draw 50 41 - 51 mm Hg    PO2-Line Draw 28 25 - 40 mm Hg    HCO3-Line Draw 28 24 - 28 mmol/L    CO2 Content-Line Draw 30 (H) 25 - 29 mmol/L    Base Excess-Line Draw 2.0 -2.0 - 3.0 mmol/L    %HBO2-Line  Draw 44.1 40.0 - 70.0 %    Carboxyhgb-Line Draw 1.1 0.0 - 2.0 %    Methemoglobin-Line Draw 0.6 0.0 - 1.5 %    Reduced Hemoglobin-Line Draw 54.2 (H) 0.0 - 5.0 %   Hepatic Function Panel    Collection Time: 09/30/16 11:35 PM   Result Value Ref Range    Total Bilirubin 0.3 0.0 - 1.5 mg/dL    Bilirubin, Direct 0.1 0.0 - 0.4 mg/dL    AST 22 13 - 39 U/L    ALT 44 7 - 52 U/L    Alkaline Phosphatase 49 36 - 125 U/L    Total Protein 7.8 6.4 - 8.9 g/dL    Albumin 4.5 3.5 - 5.7 g/dL    Bilirubin, Indirect 0.2 0.0 - 1.1 mg/dL   Lipase    Collection Time: 09/30/16 11:35 PM   Result Value Ref Range    Lipase 27 4 - 82 U/L   Magnesium    Collection Time: 09/30/16 11:35 PM   Result Value Ref Range    Magnesium 2.2 1.5 - 2.5 mg/dL   Phosphorus    Collection Time: 09/30/16 11:35 PM   Result Value Ref Range    Phosphorus 4.3 2.1 - 4.7 mg/dL   Salicylate level    Collection Time: 09/30/16 11:35 PM   Result Value Ref Range    Salicylate Lvl <3 (L) 10 - 30 mg/dL       Radiology and EKG:  No results found.    EKG: Please see electronic medical record for any studies performed in the ED.    Emergency Course and Plan     Julie Short is a 26 y.o. female who presented to the emergency department with Overdose-Intentional  who is now regretful, denying suicide attempt but rather way of getting attention. Concern for protracted depressive episode. Pt well connected to PCP and therapy services, and future oriented. Collateral contacted by [facility] PES reassuring. Safe D/C    As pt is an evaluation for suicide attempt, will need to be evaluated by another provider prior to discharge.    Diagnosis: MDD, current depressive episode    Primary psychiatric Diagnosis: MDD,  current depressive episode  Other psychiatric Diagnoses: none   Substance Use Diagnoses: ETOh use disorder?  Medical Diagnoses: none      Disposition: Discharge pending secondary evaluation by provider     Discharge from [facility]  Summary of rationale for disposition: regretful, future oriented, reassuring collateral, good safety plan, connected to services     Provider completing note: Resident, supervised by [facility].    Patient was in OTA.     Patient had a completed Statement of Belief during this encounter:yes  , released by attending physician.    Medications given in PES: no.  Medications prescribed for home or inpatient use: no.  Laboratory work ordered: no.  Other diagnostic studies ordered: yes.  Old and/or outside medical records reviewed: yes.  Collateral information contacted: yes.  Patient's outside provider contacted: no.         Nicholaus Bloom, MD  Resident  10/01/16 [phone]    I evaluated this patient face to face this am and discussed case with overnight resident. I have reviewed  the resident's note and rest of the chart. I have reviewed relevant laboratory and diagnostic information.Agree with evaluation, diagnosis, workup and  Management per resident note .  I have participated in key portions of decision making.            Barrie Lyme  Devin Going, MD  10/01/16 [phone]

## 2016-10-01 NOTE — ED Notes (Signed)
Called [facility] to arrange transportation to [facility]PES. They will arrive at approximatley 0415 hours (a 30 minute wait). I informed [facility] staff the patient has been calm and cooperative and is very anxious to go to PES. I informed the patient of the plan and she asked Why does it have to take so long?Marland Kitchen

## 2016-10-01 NOTE — Unmapped (Signed)
Julie Short was seen in the [facility] prior to transfer to [facility] by [facility] PSW Ernie Avena, LISW. Please review this providers note which has been copied below:        [facility]  Psychiatric Social Worker Assessment Consult Note  ??  ??  Julie Short                                      [id number]  ??  Chief complaint in patient's own words:: I have been stressed about work, and I was home alone and took the pills, I dont know what I was doing and I have to call my work and tell them I am not going to make it in today.   ??  Clinician's description of presenting problem: the patient is a 26 year old Caucasian Female who was brought to [facility] by [employer] and EMS for a Suicide Attempt by Overdose on her prescribed Lexapro. The police officer wrote a Statement of Belief and reported the patient told him that she overdosed on Lexapro to kill herself. It appears that the patient sent a text message and picture message to a female friend saying she was going to take a bunch of pills. The female friend called 911. The patient appears to be intoxicated on alcohol, and she apparently continued to say she was suicidal to the MD. She did not report on what triggered the attempt, and her friend Alvino Chapel reported she had been out having fun with the patient earlier tonight and did not detect any problems or signs of depression just hours before the call to 911.   ??  History:  History of Present Illness: None reported, none in records.   ??  Psychiatric History: She has been taking Lexapro for several years from her PCP. She did not report any counseling or therapy.   ??  Chemical Dependency History:  ??  Chemical Dependency History: +Etoh socially with friends. She did not  ??  Social History, Support System and Current Living Situation: The patient did not report on her early family life. She is a [employer] with some college, but did not get a degree and started working full time. She got Divorced  this past year, has no children and is dating a young man at this time. She lives alone currently and is supported by her female friends that she has known since high school.   ??  Collateral Information: Her friend Delila Pereyra in the lobby and at the bedside. Mal Misty has known the patient since high school, and she received a call from Harmon Pier who told her to come to the ER. Adelina Mings offered to take the patient home with her and keep her safe. She reported her home has no alcohol, no pills to overdose on, is very safe for the patient.   I got Murrell Converse number from [place] [phone]). Alvino Chapel reported that she did NOT call 911. The patient's boyfriend Beverely Pace called 911 after the patient sent him text messages and a Picture of a bunch of pills sitting out.   Alvino Chapel reported she had been with the patient earlier this evening. They went out for Drinks and socializing, and the patient did not mention any depression or SI all night. She did complain about stress at her job. Alvino Chapel said the night was completely normal, there was no warning at all. She was  very surprised when the patient called her a few hours later, crying, saying that Beverely Pace was going to be calling her and telling her she had overdosed.   The patient was unable to explain why she did it to [place].   ??  Mental Status Exam:   ??  Appearance and Behavior  Apparent Age: Appears Actual Age  Eye Contact: Appropriate  Appear/Hygiene: Appears chemically altered  Patient Behaviors: Agitated, Anxious, Cooperative, Demanding, Tearful  Level of Alertness: Alert  ??  Motor / Speech  Motor Activity: Restless  Speech: Logical/coherent, Argumentative  ??  Affect / Thought  Affect: Anxious, Demanding, Hopelessness  Patient's Reported Mood: depressed  Mood congruent with affect?: Yes  Thought Content: Death Wishes, Hopeless, Suicidal Ideation  Thought Processes: Organized  Perception: Appropriate  Perception Assessment: no evidence of psychosis described or  detected.   Intelligence: Average  Insight: fair  ??  Risk Factors/Stress Factors:  ??  Stress Factors  Patient Stress Factors: Financial concerns  Family Stress Factors: Loss of control  Has the patient had any recent losses?: she is divorced this past year. She is dating. Her job is stessfull.   Risk Factors  Assault Risk Assessment: No risk factors present  Self Harm/Suicidal Ideation Plan: she overdosed on her prescribed Lexapro to kill herself.   Previous Self Harm/Suicidal Plans: denied by patient, none in records.   Recent Psychological Experiences: Loss (comment) (Divorce this year. )  Family Suicide History: Unknown  Current Plans of  Homicide or to Harm Another : none reported  Previous Plans of Homicide or to Harm Another: none reported, none in records.   Access to lethal means: No  Risk Factors for Suicide: Misuse/Abuse of Alcohol/Drugs, Mental Health Disorder  Protective Factors: Support System, Connected to [employer], Life Skills, Self Esteem  ??  Telepsychiatry Considerations: Statement of Belief by the [employer]. The patient did take an overdose, so NOT a candidate for Telepsych.   ??  Formulation and Plan: The patient was brought to [facility] by EMS after she told a friend that she overdosed on her prescribed Lexapro. The Emergency planning/management officer signed a Statement of Belief, and despite her report of Emesis she was medically cleared after about 6 hours of observation. She will be sent to [facility] for a Psychiatric Evaluation.   ??  Patient notified of plan: Yes.  Patient reaction to plan: The patient is pretty demanding actually. She is anxious to go to [facility], but it is unclear why. She wants to leave as soon as possible.  Transportation Agent notified of safety needs: Inform Mobile [facility] the patient has been calm and cooperative throughout her stay.   ??

## 2016-10-01 NOTE — ED Notes (Signed)
Pt on left side lying on the stretcher with eyes closed. Breathing easy and unlabored.

## 2016-10-01 NOTE — ED Notes (Signed)
Patient continues to ask about when she will be transferred to [facility]. RN told patient that she would be medically clear after 0400 and would be up for transport after that.

## 2016-10-01 NOTE — Unmapped (Signed)
[facility]  Psychiatric Social Worker Assessment Consult Note      Julie Short    [id number]    Chief complaint in patient's own words:: I have been stressed about work, and I was home alone and took the pills, I dont know what I was doing and I have to call my work and tell them I am not going to make it in today.     Clinician's description of presenting problem: the patient is a 26 year old Caucasian Female who was brought to [facility] by [employer] and EMS for a Suicide Attempt by Overdose on her prescribed Lexapro. The police officer wrote a Statement of Belief and reported the patient told him that she overdosed on Lexapro to kill herself. It appears that the patient sent a text message and picture message to a female friend saying she was going to take a bunch of pills. The female friend called 911. The patient appears to be intoxicated on alcohol, and she apparently continued to say she was suicidal to the MD. She did not report on what triggered the attempt, and her friend Alvino Chapel reported she had been out having fun with the patient earlier tonight and did not detect any problems or signs of depression just hours before the call to 911.     History:  History of Present Illness: None reported, none in records.     Psychiatric History: She has been taking Lexapro for several years from her PCP. She did not report any counseling or therapy.     Chemical Dependency History:    Chemical Dependency History: +Etoh socially with friends. She did not    Social History, Support System and Current Living Situation: The patient did not report on her early family life. She is a [employer] with some college, but did not get a degree and started working full time. She got Divorced this past year, has no children and is dating a young man at this time. She lives alone currently and is supported by her female friends that she has known since high school.     Collateral Information: Her friend  Delila Pereyra in the lobby and at the bedside. Mal Misty has known the patient since high school, and she received a call from Harmon Pier who told her to come to the ER. Adelina Mings offered to take the patient home with her and keep her safe. She reported her home has no alcohol, no pills to overdose on, is very safe for the patient.   I got Murrell Converse number from [place] [phone]). Alvino Chapel reported that she did NOT call 911. The patient's boyfriend Beverely Pace called 911 after the patient sent him text messages and a Picture of a bunch of pills sitting out.   Alvino Chapel reported she had been with the patient earlier this evening. They went out for Drinks and socializing, and the patient did not mention any depression or SI all night. She did complain about stress at her job. Alvino Chapel said the night was completely normal, there was no warning at all. She was very surprised when the patient called her a few hours later, crying, saying that Beverely Pace was going to be calling her and telling her she had overdosed.   The patient was unable to explain why she did it to [place].     Mental Status Exam:     Appearance and Behavior  Apparent Age: Appears Actual Age  Eye Contact: Appropriate  Appear/Hygiene: Appears chemically altered  Patient Behaviors: Agitated, Anxious, Cooperative, Demanding, Tearful  Level of Alertness: Alert    Motor / Speech  Motor Activity: Restless  Speech: Logical/coherent, Argumentative    Affect / Thought  Affect: Anxious, Demanding, Hopelessness  Patient's Reported Mood: depressed  Mood congruent with affect?: Yes  Thought Content: Death Wishes, Hopeless, Suicidal Ideation  Thought Processes: Organized  Perception: Appropriate  Perception Assessment: no evidence of psychosis described or detected.   Intelligence: Average  Insight: fair      Risk Factors/Stress Factors:    Stress Factors  Patient Stress Factors: Financial concerns  Family Stress Factors: Loss of control  Has the patient had any recent losses?:  she is divorced this past year. She is dating. Her job is stessfull.   Risk Factors  Assault Risk Assessment: No risk factors present  Self Harm/Suicidal Ideation Plan: she overdosed on her prescribed Lexapro to kill herself.   Previous Self Harm/Suicidal Plans: denied by patient, none in records.   Recent Psychological Experiences: Loss (comment) (Divorce this year. )  Family Suicide History: Unknown  Current Plans of  Homicide or to Harm Another : none reported  Previous Plans of Homicide or to Harm Another: none reported, none in records.   Access to lethal means: No  Risk Factors for Suicide: Misuse/Abuse of Alcohol/Drugs, Mental Health Disorder  Protective Factors: Support System, Connected to [employer], Customer service manager, Self Esteem      Telepsychiatry Considerations: Statement of Belief by the [employer]. The patient did take an overdose, so NOT a candidate for Telepsych.     Formulation and Plan: The patient was brought to [facility] by EMS after she told a friend that she overdosed on her prescribed Lexapro. The Emergency planning/management officer signed a Statement of Belief, and despite her report of Emesis she was medically cleared after about 6 hours of observation. She will be sent to [facility] for a Psychiatric Evaluation.       Patient notified of plan: Yes.  Patient reaction to plan: The patient is pretty demanding actually. She is anxious to go to [facility], but it is unclear why. She wants to leave as soon as possible.  Transportation Agent notified of safety needs: Inform Mobile Care the patient has been calm and cooperative throughout her stay.

## 2016-10-01 NOTE — ED Notes (Signed)
Patient to [facility] with mobile care. All belongings sent with patient. No additional needs or questions voiced at this time.

## 2016-10-01 NOTE — Unmapped (Signed)
Discharge Instructions After an Episode of Suicide Threats or Actions    Remove all firearms, weapons (of any kind), or any unneeded medicines that could be used. Identify a support person/advocate and attend follow-up mental health appointments with this person. Be direct and talk openly about suicidal thoughts. Allow expression of feelings. Block all inappropriate internet websites and social media. Get help from agencies that specialize in crisis intervention. Create a personalized safety plan.     The following list are suicide prevention resources available 24 hours a day.  National:    Suicide Prevention Lifeline  1.800.273.TALK (8255)  - The Lifeline provides 24/7, free and confidential support for people in distress, prevention and crisis resources for you or your loved ones, and best practices for professionals.    [place]:   Crisis Hotline ([facility])  513.281.CARE (2273)   - 24-hour telephone support services specializing in suicide prevention, crisis situations, and family violence     [facility] ([facility] Mobile Crisis   513.584.8577   [address], [place], [place] [address]  - 24 hour psychiatric emergency mobile crisis unit trained to respond to mental health emergencies.      [place]:  [place] Mobile Crisis Team  1.844.427.4747  - 24 hour psychiatric emergency mobile crisis unit trained to respond to mental health emergencies.     [place]:   [place] 24-Hour Crisis Hotline  513.528.7283 (SAVE)  - Hotline people can call for support if they are experiencing mental health problems    [place]:   [place] Crisis Line  1.877.695.6333  - 24 hour psychiatric emergency mobile crisis unit trained to respond to mental health emergencies.      [place]:  [place] Crisis Hotline  859.331.3292  - 24-hour telephone support services specializing in suicide prevention, crisis situations, and family violence    Crisis Hotline ([facility])  513.281.CARE (2273)   Website: www.[facility].org  - 24-hour telephone support services specializing in suicide prevention, crisis situations, and family violence    [facility] Mobile Crisis  513.584.8577   [address], [place], [place] [address]  - Psychiatric emergency mobile crisis unit trained to respond to mental health emergencies    www.suicidepreventionlifeline.org

## 2016-10-01 NOTE — Unmapped (Signed)
70 White female presents via squad from [employer] after being medically cleared from an overdose of 10 Lexapro pills.  Initially it was reported that pt said she did this in an attempt to harm herself however now pt denies wanting to harm self and states she does not know why she took the overdose.  Pt is abrupt in her answers and wanting to know how long this will take.  Pt admitted to OTA, belongings secured.    Given blanket, shown to a lounge chair for comfort.  Offered food and drink.  Safety checks started.  Will continue to monitor pt.

## 2016-10-01 NOTE — Unmapped (Signed)
Sitting quietly in lounge chair.

## 2016-10-01 NOTE — ED Notes (Signed)
Pt's belongings returned to the Pt. Discharge instructions with follow up went over with the Pt.

## 2017-09-16 ENCOUNTER — Emergency Department (HOSPITAL_COMMUNITY)
Admission: EM | Admit: 2017-09-16 | Discharge: 2017-09-16 | Disposition: A | Discharge status: Home / Self Care | Payer: Self-pay | Attending: Emergency Medicine | Admitting: Emergency Medicine

## 2017-09-16 ENCOUNTER — Other Ambulatory Visit: Payer: Self-pay

## 2017-09-16 ENCOUNTER — Encounter (HOSPITAL_COMMUNITY): Payer: Self-pay | Admitting: Obstetrics and Gynecology

## 2017-09-16 DIAGNOSIS — F419 Anxiety disorder, unspecified: Secondary | ICD-10-CM | POA: Insufficient documentation

## 2017-09-16 DIAGNOSIS — Z5321 Procedure and treatment not carried out due to patient leaving prior to being seen by health care provider: Secondary | ICD-10-CM | POA: Insufficient documentation

## 2017-09-16 DIAGNOSIS — R110 Nausea: Secondary | ICD-10-CM | POA: Insufficient documentation

## 2017-09-16 NOTE — ED Triage Notes (Signed)
Pt reports she was sitting on the couch and yelled at the cat and then began having trouble breathing, shaking, and feeling scared. Pt states nothing really seemed to trigger the episode but that it has happened about once a week.

## 2017-09-17 ENCOUNTER — Encounter (HOSPITAL_COMMUNITY): Payer: Self-pay | Admitting: Emergency Medicine

## 2017-09-17 ENCOUNTER — Emergency Department (HOSPITAL_COMMUNITY)
Admission: EM | Admit: 2017-09-17 | Discharge: 2017-09-18 | Disposition: A | Discharge status: Home / Self Care | Payer: Self-pay | Attending: Emergency Medicine | Admitting: Emergency Medicine

## 2017-09-17 DIAGNOSIS — R42 Dizziness and giddiness: Secondary | ICD-10-CM | POA: Insufficient documentation

## 2017-09-17 DIAGNOSIS — Z79899 Other long term (current) drug therapy: Secondary | ICD-10-CM | POA: Insufficient documentation

## 2017-09-17 DIAGNOSIS — R001 Bradycardia, unspecified: Secondary | ICD-10-CM | POA: Insufficient documentation

## 2017-09-17 LAB — BASIC METABOLIC PANEL
Anion gap: 11 (ref 5–15)
BUN: 13 mg/dL (ref 6–20)
CALCIUM: 9.5 mg/dL (ref 8.9–10.3)
CO2: 23 mmol/L (ref 22–32)
CREATININE: 1 mg/dL (ref 0.44–1.00)
Chloride: 107 mmol/L (ref 98–111)
GFR calc Af Amer: >60 mL/min (ref >60)
GFR calc non Af Amer: >60 mL/min (ref >60)
GLUCOSE: 89 mg/dL (ref 70–99)
Potassium: 3.5 mmol/L (ref 3.5–5.1)
Sodium: 141 mmol/L (ref 135–145)

## 2017-09-17 LAB — URINALYSIS, ROUTINE W REFLEX MICROSCOPIC
BACTERIA UA: NONE SEEN
BILIRUBIN URINE: NEGATIVE
Glucose, UA: NEGATIVE mg/dL
Hgb urine dipstick: NEGATIVE
Ketones, ur: 80 mg/dL — AB
Nitrite: NEGATIVE
Protein, ur: 30 mg/dL — AB
SPECIFIC GRAVITY, URINE: 1.028 (ref 1.005–1.030)
pH: 5 (ref 5.0–8.0)

## 2017-09-17 LAB — CBC WITH DIFFERENTIAL/PLATELET
Basophils Absolute: 0 10*3/uL (ref 0.0–0.1)
Basophils Relative: 0 %
EOS PCT: 3 %
Eosinophils Absolute: 0.2 10*3/uL (ref 0.0–0.7)
HCT: 39.3 % (ref 36.0–46.0)
Hemoglobin: 13.6 g/dL (ref 12.0–15.0)
LYMPHS PCT: 42 %
Lymphs Abs: 2.6 10*3/uL (ref 0.7–4.0)
MCH: 30.4 pg (ref 26.0–34.0)
MCHC: 34.6 g/dL (ref 30.0–36.0)
MCV: 87.7 fL (ref 78.0–100.0)
MONOS PCT: 6 %
Monocytes Absolute: 0.4 10*3/uL (ref 0.1–1.0)
Neutro Abs: 3.1 10*3/uL (ref 1.7–7.7)
Neutrophils Relative %: 49 %
PLATELETS: 217 10*3/uL (ref 150–400)
RBC: 4.48 MIL/uL (ref 3.87–5.11)
RDW: 12.5 % (ref 11.5–15.5)
WBC: 6.3 10*3/uL (ref 4.0–10.5)

## 2017-09-17 LAB — POC URINE PREG, ED: PREG TEST UR: NEGATIVE

## 2017-09-17 NOTE — ED Triage Notes (Signed)
Pt reports that she was seen at a clinic today and was told that she needed to go to ED bc her HR was in the 40s. Reports that she was seen here yesterday for SOB and left before was seen due to feeling better while in waiting room.

## 2017-09-17 NOTE — Discharge Instructions (Signed)
All of your labs and your electrocardiogram tonight are reassuring. It is possible your slow heart rate may be associated with your prozac. Please follow-up with your primary care provider as discussed.

## 2017-09-17 NOTE — ED Notes (Signed)
Pt ambulated om the hall and her O2 stayed at 100% and her heart rate was in the 80s.  She reported no dizziness and had a normal gait.

## 2017-09-17 NOTE — ED Provider Notes (Signed)
[facility]-EMERGENCY DEPT Provider Note   CSN: [id number] Arrival date & time: 09/17/17  1646     History   Chief Complaint Chief Complaint  Patient presents with  . low HR    HPI Grace Grant is a 27 y.o. female.  Patient came to the ED yesterday for shortness of breath, which improved before she was seen, and she decided to leave. She went to the [place] walk-in clinic today, noted to have a pulse rate in the 40's. She states that she is not an athlete, does not exercise regularly. She endorses occasional dizziness. History of anxiety.  The history is provided by the patient. No language interpreter was used.  Dizziness  Quality:  Lightheadedness Severity:  Mild Onset quality:  Unable to specify Timing:  Sporadic Progression:  Waxing and waning Chronicity:  Recurrent Associated symptoms: no chest pain   Risk factors: no hx of vertigo     History reviewed. No pertinent past medical history.  There are no active problems to display for this patient.   Past Surgical History:  Procedure Laterality Date  . WISDOM TOOTH EXTRACTION       OB History    Gravida      Para      Term      Preterm      AB      Living  0     SAB      TAB      Ectopic      Multiple      Live Births               Home Medications    Prior to Admission medications   Medication Sig Start Date End Date Taking? Authorizing Provider  busPIRone (BUSPAR) 10 MG tablet Take 10 mg by mouth 3 (three) times daily.   Yes [provider]  FLUoxetine HCl (PROZAC PO) Take 1 Can by mouth daily.    Yes [provider]  PRESCRIPTION MEDICATION Take 1 tablet by mouth daily.   Yes [provider]  traZODone (DESYREL) 150 MG tablet Take 75 mg by mouth at bedtime.   Yes [provider]    Family History No family history on file.  Social History Social History   Tobacco Use  . Smoking status: Never Smoker  . Smokeless  tobacco: Never Used  Substance Use Topics  . Alcohol use: Not Currently  . Drug use: Never     Allergies   Patient has no known allergies.   Review of Systems Review of Systems  Respiratory: Negative for wheezing.   Cardiovascular: Negative for chest pain.  Gastrointestinal: Negative for abdominal pain.  Neurological: Positive for dizziness.  All other systems reviewed and are negative.    Physical Exam Updated Vital Signs BP 129/76 (BP Location: Left Arm)   Pulse (!) 52   Temp 98.1 F (36.7 C) (Oral)   Resp 18   Ht 5\' 6"  (1.676 m)   Wt 65.8 kg   LMP 09/02/2017 (Approximate)   SpO2 100%   BMI 23.40 kg/m   Physical Exam  Constitutional: She is oriented to person, place, and time. She appears well-developed and well-nourished.  HENT:  Head: Atraumatic.  Eyes: Conjunctivae are normal.  Neck: Neck supple.  Cardiovascular: Regular rhythm, normal heart sounds and intact distal pulses.  No extrasystoles are present. Bradycardia present.  Pulmonary/Chest: Effort normal and breath sounds normal. No respiratory distress. She has no wheezes.  Abdominal: Soft.  Musculoskeletal: Normal range of motion. She exhibits no edema.  Neurological: She is alert and oriented to person, place, and time.  Skin: Skin is warm and dry.  Psychiatric: She has a normal mood and affect.  Nursing note and vitals reviewed.    ED Treatments / Results  Labs (all labs ordered are listed, but only abnormal results are displayed) Labs Reviewed - No data to display  EKG None  Radiology No results found.  Procedures Procedures (including critical care time)  Medications Ordered in ED Medications - No data to display   Initial Impression / Assessment and Plan / ED Course  I have reviewed the triage vital signs and the nursing notes.  Pertinent labs & imaging results that were available during my care of the patient were reviewed by me and considered in my medical decision making (see  chart for details).     Patient ambulated in the department with pulse rate in the 80's and oxygen saturation greater than 98 %. Labs and current ECG reassuring. Bradycardia intermittent in nature. No concerning signs of instability such as AMS, hypotension, end organ dysfunction, or acute heart failure. No evidence of MI or electrolyte disturbance.  Final Clinical Impressions(s) / ED Diagnoses   Final diagnoses:  Bradycardia on ECG    ED Discharge Orders    None       Felicie MornSmith, Keilee Denman, NP 09/18/17 Candida Peeling0025    Wentz, [place], MD 09/18/17 [phone]

## 2018-10-28 ENCOUNTER — Other Ambulatory Visit: Payer: Self-pay

## 2018-10-28 ENCOUNTER — Inpatient Hospital Stay (HOSPITAL_COMMUNITY)
Admission: EM | Admit: 2018-10-28 | Discharge: 2018-10-31 | DRG: 918 | Disposition: A | Discharge status: Other Acute Inpatient Hospital | Payer: 59 | Attending: Internal Medicine | Admitting: Internal Medicine

## 2018-10-28 ENCOUNTER — Emergency Department (HOSPITAL_COMMUNITY): Payer: 59

## 2018-10-28 ENCOUNTER — Encounter (HOSPITAL_COMMUNITY): Payer: Self-pay | Admitting: Emergency Medicine

## 2018-10-28 DIAGNOSIS — F419 Anxiety disorder, unspecified: Secondary | ICD-10-CM | POA: Diagnosis present

## 2018-10-28 DIAGNOSIS — F329 Major depressive disorder, single episode, unspecified: Secondary | ICD-10-CM

## 2018-10-28 DIAGNOSIS — T39312A Poisoning by propionic acid derivatives, intentional self-harm, initial encounter: Secondary | ICD-10-CM | POA: Diagnosis present

## 2018-10-28 DIAGNOSIS — Y909 Presence of alcohol in blood, level not specified: Secondary | ICD-10-CM | POA: Diagnosis present

## 2018-10-28 DIAGNOSIS — I951 Orthostatic hypotension: Secondary | ICD-10-CM | POA: Diagnosis present

## 2018-10-28 DIAGNOSIS — Z20828 Contact with and (suspected) exposure to other viral communicable diseases: Secondary | ICD-10-CM | POA: Diagnosis present

## 2018-10-28 DIAGNOSIS — T50902A Poisoning by unspecified drugs, medicaments and biological substances, intentional self-harm, initial encounter: Secondary | ICD-10-CM

## 2018-10-28 DIAGNOSIS — T43212A Poisoning by selective serotonin and norepinephrine reuptake inhibitors, intentional self-harm, initial encounter: Principal | ICD-10-CM | POA: Diagnosis present

## 2018-10-28 DIAGNOSIS — T391X2A Poisoning by 4-Aminophenol derivatives, intentional self-harm, initial encounter: Secondary | ICD-10-CM | POA: Diagnosis present

## 2018-10-28 DIAGNOSIS — F332 Major depressive disorder, recurrent severe without psychotic features: Secondary | ICD-10-CM | POA: Diagnosis present

## 2018-10-28 DIAGNOSIS — T39012A Poisoning by aspirin, intentional self-harm, initial encounter: Secondary | ICD-10-CM | POA: Diagnosis present

## 2018-10-28 DIAGNOSIS — E860 Dehydration: Secondary | ICD-10-CM | POA: Diagnosis present

## 2018-10-28 DIAGNOSIS — T50901A Poisoning by unspecified drugs, medicaments and biological substances, accidental (unintentional), initial encounter: Secondary | ICD-10-CM | POA: Diagnosis present

## 2018-10-28 DIAGNOSIS — F32A Depression, unspecified: Secondary | ICD-10-CM

## 2018-10-28 DIAGNOSIS — F331 Major depressive disorder, recurrent, moderate: Secondary | ICD-10-CM | POA: Diagnosis present

## 2018-10-28 DIAGNOSIS — Z915 Personal history of self-harm: Secondary | ICD-10-CM

## 2018-10-28 DIAGNOSIS — Y92009 Unspecified place in unspecified non-institutional (private) residence as the place of occurrence of the external cause: Secondary | ICD-10-CM | POA: Diagnosis not present

## 2018-10-28 DIAGNOSIS — F330 Major depressive disorder, recurrent, mild: Secondary | ICD-10-CM | POA: Diagnosis present

## 2018-10-28 DIAGNOSIS — Z79899 Other long term (current) drug therapy: Secondary | ICD-10-CM

## 2018-10-28 DIAGNOSIS — F1010 Alcohol abuse, uncomplicated: Secondary | ICD-10-CM | POA: Diagnosis present

## 2018-10-28 DIAGNOSIS — T1491XA Suicide attempt, initial encounter: Secondary | ICD-10-CM | POA: Insufficient documentation

## 2018-10-28 DIAGNOSIS — R9431 Abnormal electrocardiogram [ECG] [EKG]: Secondary | ICD-10-CM | POA: Diagnosis present

## 2018-10-28 DIAGNOSIS — E876 Hypokalemia: Secondary | ICD-10-CM

## 2018-10-28 HISTORY — DX: Anxiety disorder, unspecified: F41.9

## 2018-10-28 HISTORY — DX: Poisoning by unspecified drugs, medicaments and biological substances, accidental (unintentional), initial encounter: T50.901A

## 2018-10-28 HISTORY — DX: Depression, unspecified: F32.A

## 2018-10-28 HISTORY — DX: Hypokalemia: E87.6

## 2018-10-28 HISTORY — DX: Bradycardia, unspecified: R00.1

## 2018-10-28 LAB — CBC WITH DIFFERENTIAL/PLATELET
Abs Immature Granulocytes: 0.01 10*3/uL (ref 0.00–0.07)
Basophils Absolute: 0 10*3/uL (ref 0.0–0.1)
Basophils Relative: 1 %
Eosinophils Absolute: 0.1 10*3/uL (ref 0.0–0.5)
Eosinophils Relative: 3 %
HCT: 41.7 % (ref 36.0–46.0)
Hemoglobin: 13.9 g/dL (ref 12.0–15.0)
Immature Granulocytes: 0 %
Lymphocytes Relative: 59 %
Lymphs Abs: 1.9 10*3/uL (ref 0.7–4.0)
MCH: 29.7 pg (ref 26.0–34.0)
MCHC: 33.3 g/dL (ref 30.0–36.0)
MCV: 89.1 fL (ref 80.0–100.0)
Monocytes Absolute: 0.1 10*3/uL (ref 0.1–1.0)
Monocytes Relative: 4 %
Neutro Abs: 1.1 10*3/uL — ABNORMAL LOW (ref 1.7–7.7)
Neutrophils Relative %: 33 %
Platelets: 210 10*3/uL (ref 150–400)
RBC: 4.68 MIL/uL (ref 3.87–5.11)
RDW: 13.1 % (ref 11.5–15.5)
WBC: 3.2 10*3/uL — ABNORMAL LOW (ref 4.0–10.5)
nRBC: 0 % (ref 0.0–0.2)

## 2018-10-28 LAB — ACETAMINOPHEN LEVEL
Acetaminophen (Tylenol), Serum: 41 ug/mL — ABNORMAL HIGH (ref 10–30)
Acetaminophen (Tylenol), Serum: 35 ug/mL — ABNORMAL HIGH (ref 10–30)

## 2018-10-28 LAB — COMPREHENSIVE METABOLIC PANEL
ALT: 11 U/L (ref 0–44)
ALT: 11 U/L (ref 0–44)
AST: 14 U/L — ABNORMAL LOW (ref 15–41)
AST: 18 U/L (ref 15–41)
Albumin: 4 g/dL (ref 3.5–5.0)
Albumin: 3.5 g/dL (ref 3.5–5.0)
Alkaline Phosphatase: 29 U/L — ABNORMAL LOW (ref 38–126)
Alkaline Phosphatase: 25 U/L — ABNORMAL LOW (ref 38–126)
Anion gap: 14 (ref 5–15)
Anion gap: 9 (ref 5–15)
BUN: 9 mg/dL (ref 6–20)
BUN: 7 mg/dL (ref 6–20)
CO2: 19 mmol/L — ABNORMAL LOW (ref 22–32)
CO2: 22 mmol/L (ref 22–32)
Calcium: 8.4 mg/dL — ABNORMAL LOW (ref 8.9–10.3)
Calcium: 7.9 mg/dL — ABNORMAL LOW (ref 8.9–10.3)
Chloride: 108 mmol/L (ref 98–111)
Chloride: 108 mmol/L (ref 98–111)
Creatinine, Ser: 0.87 mg/dL (ref 0.44–1.00)
Creatinine, Ser: 0.76 mg/dL (ref 0.44–1.00)
GFR calc Af Amer: >60 mL/min (ref >60)
GFR calc Af Amer: >60 mL/min (ref >60)
GFR calc non Af Amer: >60 mL/min (ref >60)
GFR calc non Af Amer: >60 mL/min (ref >60)
Glucose, Bld: 112 mg/dL — ABNORMAL HIGH (ref 70–99)
Glucose, Bld: 81 mg/dL (ref 70–99)
Potassium: 3.4 mmol/L — ABNORMAL LOW (ref 3.5–5.1)
Potassium: 4.3 mmol/L (ref 3.5–5.1)
Sodium: 141 mmol/L (ref 135–145)
Sodium: 139 mmol/L (ref 135–145)
Total Bilirubin: 0.8 mg/dL (ref 0.3–1.2)
Total Bilirubin: 0.6 mg/dL (ref 0.3–1.2)
Total Protein: 7.1 g/dL (ref 6.5–8.1)
Total Protein: 6 g/dL — ABNORMAL LOW (ref 6.5–8.1)

## 2018-10-28 LAB — CREATININE, SERUM
Creatinine, Ser: 0.76 mg/dL (ref 0.44–1.00)
GFR calc Af Amer: >60 mL/min (ref >60)
GFR calc non Af Amer: >60 mL/min (ref >60)

## 2018-10-28 LAB — RAPID URINE DRUG SCREEN, HOSP PERFORMED
Amphetamines: POSITIVE — AB
Barbiturates: NOT DETECTED
Benzodiazepines: NOT DETECTED
Cocaine: NOT DETECTED
Opiates: NOT DETECTED
Tetrahydrocannabinol: NOT DETECTED

## 2018-10-28 LAB — SALICYLATE LEVEL
Salicylate Lvl: <7 mg/dL (ref 2.8–30.0)
Salicylate Lvl: <7 mg/dL (ref 2.8–30.0)

## 2018-10-28 LAB — HIV ANTIBODY (ROUTINE TESTING W REFLEX): HIV Screen 4th Generation wRfx: NONREACTIVE

## 2018-10-28 LAB — MAGNESIUM
Magnesium: 2.1 mg/dL (ref 1.7–2.4)
Magnesium: 2.1 mg/dL (ref 1.7–2.4)

## 2018-10-28 LAB — CBG MONITORING, ED: Glucose-Capillary: 99 mg/dL (ref 70–99)

## 2018-10-28 LAB — SARS CORONAVIRUS 2 BY RT PCR (HOSPITAL ORDER, PERFORMED IN ~~LOC~~ HOSPITAL LAB): SARS Coronavirus 2: NEGATIVE

## 2018-10-28 LAB — I-STAT BETA HCG BLOOD, ED (MC, WL, AP ONLY): I-stat hCG, quantitative: <5 m[IU]/mL (ref <5)

## 2018-10-28 LAB — ETHANOL: Alcohol, Ethyl (B): 152 mg/dL — ABNORMAL HIGH (ref <10)

## 2018-10-28 MED ORDER — NICOTINE 14 MG/24HR TD PT24
14.0000 mg | MEDICATED_PATCH | Freq: Every day | TRANSDERMAL | Status: DC
  Administered 2018-10-28 – 2018-10-31 (×4): 14 mg via TRANSDERMAL
  Filled 2018-10-28 (×4): qty 1

## 2018-10-28 MED ORDER — ALUM & MAG HYDROXIDE-SIMETH 200-200-20 MG/5ML PO SUSP: 30.0000 mL | Freq: Four times a day (QID) | ORAL | Status: DC | PRN

## 2018-10-28 MED ORDER — POTASSIUM CHLORIDE 10 MEQ/100ML IV SOLN
10.0000 meq | INTRAVENOUS | Status: AC
  Administered 2018-10-28 (×5): 10 meq via INTRAVENOUS
  Filled 2018-10-28 (×4): qty 100

## 2018-10-28 MED ORDER — ONDANSETRON HCL 4 MG PO TABS
4.0000 mg | ORAL_TABLET | Freq: Three times a day (TID) | ORAL | Status: DC | PRN
  Administered 2018-10-28: 4 mg via ORAL
  Filled 2018-10-28: qty 1

## 2018-10-28 MED ORDER — ENOXAPARIN SODIUM 40 MG/0.4ML ~~LOC~~ SOLN
40.0000 mg | SUBCUTANEOUS | Status: DC
  Administered 2018-10-28: 40 mg via SUBCUTANEOUS
  Filled 2018-10-28 (×2): qty 0.4

## 2018-10-28 MED ORDER — SODIUM BICARBONATE 8.4 % IV SOLN
50.0000 meq | Freq: Once | INTRAVENOUS | Status: AC
  Administered 2018-10-28: 50 meq via INTRAVENOUS
  Filled 2018-10-28: qty 50

## 2018-10-28 MED ORDER — SODIUM CHLORIDE 0.9 % IV SOLN
INTRAVENOUS | Status: DC
  Administered 2018-10-28 – 2018-10-29 (×2): via INTRAVENOUS

## 2018-10-28 MED ORDER — CHLORHEXIDINE GLUCONATE CLOTH 2 % EX PADS
6.0000 | MEDICATED_PAD | Freq: Every day | CUTANEOUS | Status: DC
  Administered 2018-10-28 – 2018-10-31 (×4): 6 via TOPICAL

## 2018-10-28 MED ORDER — STERILE WATER FOR INJECTION IV SOLN
INTRAVENOUS | Status: DC
  Administered 2018-10-28: 05:00:00 via INTRAVENOUS
  Filled 2018-10-28: qty 850

## 2018-10-28 MED ORDER — MAGNESIUM SULFATE IN D5W 1-5 GM/100ML-% IV SOLN
1.0000 g | Freq: Once | INTRAVENOUS | Status: AC
  Administered 2018-10-28: 1 g via INTRAVENOUS
  Filled 2018-10-28: qty 100

## 2018-10-28 MED ORDER — SCOPOLAMINE 1 MG/3DAYS TD PT72: 1.0000 | MEDICATED_PATCH | TRANSDERMAL | Status: DC

## 2018-10-28 NOTE — ED Notes (Signed)
Poison control recommends fluids for tachycardia, get a tylenol, aspirin and magnesium level. For her prolonged QTC interval, get her potassium and magnesium high-end normal, then repeat EKG. Fluids for tachycardia, we can give her benzos if she starts to 'get wild' and the Cymbalta can lower the sodium levels and make her sweaty.  6 hours observation at least, unless she is at her baseline sooner.

## 2018-10-28 NOTE — H&P (Addendum)
History and Physical    Mykenna Viele XKG:[id number] DOB: 01-20-1990 DOA: 10/28/2018  PCP: Patient, No Pcp Per Patient coming from: Home  Chief Complaint: Overdose  HPI: Kavita Bartl is a 28 y.o. female with  past medical history significant for depression took 10 tablets of 150 mg trazodone, 23 tablets of 30 mg Cymbalta, 9 tablets of Excedrin, 12 tablets of Mucinex and 2-3 bottles of wine to kill herself at 9:30 PM last night.this am she called EMS. She has been prescribed trazadone and buspar. Her fiancee passed away 3 weeks ago due to unknown reason. She tells me today she wants to live. She feels anxious jittery and nauseous, denies vomiting.  No diarrhea.  She denies fever chills cough.\ She denies suicidal ideation when I saw her in the ER. Patient has a history of prior suicidal attempt in 2018 per mother.  ED physician Dr. Nicholes Stairs discussed with poison control early this morning they recommended to start the patient on bicarb drip which she received her potassium was replaced. ED physician Dr. Gilford Raid spoke with poison control prior to calling me for admissions and they recommended to DC bicarb drip.  Poison control recommended to admit and observe until the QTC is back to normal.  ED Course: Salicylate levels less than 7 acetaminophen level 35 QTc 554, sodium 141 potassium 3.4 BUN 9 creatinine 0.87 magnesium 2.1 white count 3.2 hemoglobin 13.9 platelets 210, Covid negative, pregnancy test negative, urine drug screen positive for amphetamines, chest x-ray negative.  Vital signs blood pressure 101 66 heart rate 87 respiration 19 temperature 99.7 saturation 95% on room air.  Review of Systems: As per HPI otherwise all other systems reviewed and are negative  Ambulatory Status she ambulates without any difficulty prior to admission.  She works as a Statistician.  Past Medical History:  Diagnosis Date  . Anxiety   . Bradycardia   . Depression     Past Surgical  History:  Procedure Laterality Date  . WISDOM TOOTH EXTRACTION      Social History   Socioeconomic History  . Marital status: Single    Spouse name: Not on file  . Number of children: Not on file  . Years of education: Not on file  . Highest education level: Not on file  Occupational History  . Not on file  Social Needs  . Financial resource strain: Not on file  . Food insecurity    Worry: Not on file    Inability: Not on file  . Transportation needs    Medical: Not on file    Non-medical: Not on file  Tobacco Use  . Smoking status: Never Smoker  . Smokeless tobacco: Never Used  Substance and Sexual Activity  . Alcohol use: Yes    Comment: occ  . Drug use: Never  . Sexual activity: Yes    Birth control/protection: Pill  Lifestyle  . Physical activity    Days per week: Not on file    Minutes per session: Not on file  . Stress: Not on file  Relationships  . Social Herbalist on phone: Not on file    Gets together: Not on file    Attends religious service: Not on file    Active member of club or organization: Not on file    Attends meetings of clubs or organizations: Not on file    Relationship status: Not on file  . Intimate partner violence    Fear of current or ex partner:  Not on file    Emotionally abused: Not on file    Physically abused: Not on file    Forced sexual activity: Not on file  Other Topics Concern  . Not on file  Social History Narrative  . Not on file    No Known Allergies  No family history on file.    Prior to Admission medications   Medication Sig Start Date End Date Taking? Authorizing Provider  busPIRone (BUSPAR) 10 MG tablet Take 10 mg by mouth 3 (three) times daily.    [provider]  FLUoxetine HCl (PROZAC PO) Take 1 Can by mouth daily.     [provider]  PRESCRIPTION MEDICATION Take 1 tablet by mouth daily.    [provider]  traZODone (DESYREL) 150 MG tablet Take 75 mg by mouth at  bedtime.    [provider]    Physical Exam: Patient restless but able to answer questions appropriately and follow commands  Vitals:   10/28/18 0915 10/28/18 0930 10/28/18 0945 10/28/18 1000  BP: 104/72 112/85 110/78 101/66  Pulse: 89 82 80 86  Resp: 14 17 (!) 9 19  Temp: 99.5 F (37.5 C) 99.7 F (37.6 C) 99.7 F (37.6 C) 99.7 F (37.6 C)  TempSrc:      SpO2: 99% 96% 97% 95%  Weight:      Height:         . General: Appears restless oral mucosa dry . Eyes: PERRL, EOMI, normal lids, iris . ENT: grossly normal hearing, lips & tongue, mmm . Neck: no LAD, masses or thyromegaly . Cardiovascular:  RRR, no m/r/g. No LE edema.  Marland Kitchen Respiratory:  CTA bilaterally, no w/r/r. Normal respiratory effort. . Abdomen:  soft, ntnd, NABS . Skin:  no rash or induration seen on limited exam . Musculoskeletal: grossly normal tone BUE/BLE, good ROM, no bony abnormality . Psychiatric: grossly normal mood and affect, speech fluent and appropriate, AOx3 . Neurologic: CN 2-12 grossly intact, moves all extremities in coordinated fashion, sensation intact  Labs on Admission: I have personally reviewed following labs and imaging studies  CBC: Recent Labs  Lab 10/28/18 0403  WBC 3.2*  NEUTROABS 1.1*  HGB 13.9  HCT 41.7  MCV 89.1  PLT 210   Basic Metabolic Panel: Recent Labs  Lab 10/28/18 0403  NA 141  K 3.4*  CL 108  CO2 19*  GLUCOSE 112*  BUN 9  CREATININE 0.87  CALCIUM 8.4*  MG 2.1   GFR: Estimated Creatinine Clearance: 81.9 mL/min (by C-G formula based on SCr of 0.87 mg/dL). Liver Function Tests: Recent Labs  Lab 10/28/18 0403  AST 14*  ALT 11  ALKPHOS 29*  BILITOT 0.8  PROT 7.1  ALBUMIN 4.0   No results for input(s): LIPASE, AMYLASE in the last 168 hours. No results for input(s): AMMONIA in the last 168 hours. Coagulation Profile: No results for input(s): INR, PROTIME in the last 168 hours. Cardiac Enzymes: No results for input(s): CKTOTAL, CKMB, CKMBINDEX,  TROPONINI in the last 168 hours. BNP (last 3 results) No results for input(s): PROBNP in the last 8760 hours. HbA1C: No results for input(s): HGBA1C in the last 72 hours. CBG: Recent Labs  Lab 10/28/18 0412  GLUCAP 99   Lipid Profile: No results for input(s): CHOL, HDL, LDLCALC, TRIG, CHOLHDL, LDLDIRECT in the last 72 hours. Thyroid Function Tests: No results for input(s): TSH, T4TOTAL, FREET4, T3FREE, THYROIDAB in the last 72 hours. Anemia Panel: No results for input(s): VITAMINB12, FOLATE, FERRITIN,  TIBC, IRON, RETICCTPCT in the last 72 hours. Urine analysis:    Component Value Date/Time   COLORURINE YELLOW 09/17/2017 2219   APPEARANCEUR HAZY (A) 09/17/2017 2219   LABSPEC 1.028 09/17/2017 2219   PHURINE 5.0 09/17/2017 2219   GLUCOSEU NEGATIVE 09/17/2017 2219   HGBUR NEGATIVE 09/17/2017 2219   BILIRUBINUR NEGATIVE 09/17/2017 2219   KETONESUR 80 (A) 09/17/2017 2219   PROTEINUR 30 (A) 09/17/2017 2219   NITRITE NEGATIVE 09/17/2017 2219   LEUKOCYTESUR TRACE (A) 09/17/2017 2219    Creatinine Clearance: Estimated Creatinine Clearance: 81.9 mL/min (by C-G formula based on SCr of 0.87 mg/dL).  Sepsis Labs: @LABRCNTIP (procalcitonin:4,lacticidven:4) ) Recent Results (from the past 240 hour(s))  SARS Coronavirus 2 by RT PCR (hospital order, performed in [facility]) Nasopharyngeal Nasopharyngeal Swab     Status: None   Collection Time: 10/28/18  4:03 AM   Specimen: Nasopharyngeal Swab  Result Value Ref Range Status   SARS Coronavirus 2 NEGATIVE NEGATIVE Final    Comment: (NOTE) If result is NEGATIVE SARS-CoV-2 target nucleic acids are NOT DETECTED. The SARS-CoV-2 RNA is generally detectable in upper and lower  respiratory specimens during the acute phase of infection. The lowest  concentration of SARS-CoV-2 viral copies this assay can detect is 250  copies / mL. A negative result does not preclude SARS-CoV-2 infection  and should not be used as the sole basis  for treatment or other  patient management decisions.  A negative result may occur with  improper specimen collection / handling, submission of specimen other  than nasopharyngeal swab, presence of viral mutation(s) within the  areas targeted by this assay, and inadequate number of viral copies  (<250 copies / mL). A negative result must be combined with clinical  observations, patient history, and epidemiological information. If result is POSITIVE SARS-CoV-2 target nucleic acids are DETECTED. The SARS-CoV-2 RNA is generally detectable in upper and lower  respiratory specimens dur ing the acute phase of infection.  Positive  results are indicative of active infection with SARS-CoV-2.  Clinical  correlation with patient history and other diagnostic information is  necessary to determine patient infection status.  Positive results do  not rule out bacterial infection or co-infection with other viruses. If result is PRESUMPTIVE POSTIVE SARS-CoV-2 nucleic acids MAY BE PRESENT.   A presumptive positive result was obtained on the submitted specimen  and confirmed on repeat testing.  While 2019 novel coronavirus  (SARS-CoV-2) nucleic acids may be present in the submitted sample  additional confirmatory testing may be necessary for epidemiological  and / or clinical management purposes  to differentiate between  SARS-CoV-2 and other Sarbecovirus currently known to infect humans.  If clinically indicated additional testing with an alternate test  methodology [phone] is advised. The SARS-CoV-2 RNA is generally  detectable in upper and lower respiratory sp ecimens during the acute  phase of infection. The expected result is Negative. Fact Sheet for Patients:  BoilerBrush.com.cyhttps://www.fda.gov/media/136312/download Fact Sheet for Healthcare Providers: https://pope.com/https://www.fda.gov/media/136313/download This test is not yet approved or cleared by the [place] FDA and has been authorized for detection and/or  diagnosis of SARS-CoV-2 by FDA under an Emergency Use Authorization (EUA).  This EUA will remain in effect (meaning this test can be used) for the duration of the COVID-19 declaration under Section 564(b)(1) of the Act, 21 U.S.C. section 360bbb-3(b)(1), unless the authorization is terminated or revoked sooner. Performed at [facility], [address] [place], [place] [address]      Radiological Exams  on Admission: Dg Chest Portable 1 View  Result Date: 10/28/2018 CLINICAL DATA:  Overdose EXAM: PORTABLE CHEST 1 VIEW COMPARISON:  None. FINDINGS: The heart size and mediastinal contours are within normal limits. Both lungs are clear. The visualized skeletal structures are unremarkable. Artifact from EKG leads IMPRESSION: No active disease. Electronically Signed   By: Marnee Spring M.D.   On: 10/28/2018 04:35    EKG: Prolonged QTC 554 independently reviewed  Assessment/Plan Active Problems:   OD (overdose of drug)   #1 drug overdose with suicidal intention-patient took unknown quantity of Excedrin, trazodone, Cymbalta, Tylenol, and ibuprofen. She now reports that she does not want to die she wants to live.  She was grieving about recent loss of her fianc 3 weeks ago.  Poison control recommends keeping her magnesium and potassium high end of normal.  Since her Tylenol level was 7-1/2 hours after she overdosed they recommended not to treat a Tylenol level of 35.  Tylenol level 41 initially down to 35.  They recommend repeat EKG today and tomorrow.  Repleting potassium and magnesium to keep the high end of normal. Cymbalta can cause hyponatremia follow-up labs Poison control okay with giving her benzos if she gets too out of hand or gets wild.  Admit to telemetry Follow QTC IV fluids EKG today and in a.m. Psych consult in am once medically cleared  Addendum repeat potassium 4.3 up from 3.4 magnesium 2.1  #2 anxiety depression patient takes BuSpar and trazodone at  home.  This will be on hold.  #3 hypokalemia replaced recheck today.  Severity of Illness: The appropriate patient status for this patient is INPATIENT. Inpatient status is judged to be reasonable and necessary in order to provide the required intensity of service to ensure the patient's safety. The patient's presenting symptoms, physical exam findings, and initial radiographic and laboratory data in the context of their chronic comorbidities is felt to place them at high risk for further clinical deterioration. Furthermore, it is not anticipated that the patient will be medically stable for discharge from the hospital within 2 midnights of admission. The following factors support the patient status of inpatient.   " The patient's presenting symptoms include drug overdose anxiety . " The worrisome physical exam findings include hypotension tachycardia dry mucous membrane " The initial radiographic and laboratory data are worrisome because of hypokalemia prolonged QTC  " The chronic co-morbidities include anxiety depression previous history of suicide attempt in 2542   * I certify that at the point of admission it is my clinical judgment that the patient will require inpatient hospital care spanning beyond 2 midnights from the point of admission due to high intensity of service, high risk for further deterioration and high frequency of surveillance required.*   Estimated body mass index is 19.37 kg/m as calculated from the following:   Height as of this encounter: 5\' 6"  (1.676 m).   Weight as of this encounter: 54.4 kg.   DVT prophylaxis: Lovenox Code Status: Full code Family Communication:dw mother 24 22 2741 mother lives in [place] [address] she is planning to come to [place] this week and be with her daughter. Disposition Plan: Pending clinical improvement Consults called: None Admission status: Inpatient   [place] MD [facility]  If 7PM-7AM, please contact  night-coverage [email or web address] Password [facility]  10/28/2018, 10:52 AM

## 2018-10-28 NOTE — Progress Notes (Signed)
This chaplain responded to consult for Pt. spiritual care. The chaplain checked in with the Pt. RN-David before the visit.  The Pt. declined a visit with the chaplain. The Pt. is accompanied by a sitter. The chaplain understands from the Pt. she wants to rest at this time, but is too shaky to rest.  The Pt. Is open to a F/U visit from spiritual care.

## 2018-10-28 NOTE — ED Notes (Addendum)
Spoke with patient's mother, Elisabeth Cara, at [phone]. 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 This is her second suicide attempt, the first one was in similar in fashion, in the fall of 04-22-16. Her fiance died unexpectedly 05-Oct-2022 of this year.

## 2018-10-28 NOTE — BH Assessment (Signed)
Tele Assessment Note   Patient Name: Grace Grant MRN: [id number] Referring Physician: Dr. Particia Nearing Location of Patient: [place] Location of Provider: Behavioral Health TTS Department  Navil Kole is an 28 y.o. female. Pt states she overdosed on Trazodone, Cymbalta, and Tylenol. Pt states she attempted to suicide due to her fiancee recently passing away. The Pt denies previous SI attempts. Pt states she is seen by her therapist weekly. Pt denies previous inpatient hospitalization. Pt denies SA. Pt states she has a support system.  Pt will be medically admitted.  Diagnosis: F33.2 MDD  Past Medical History:  Past Medical History:  Diagnosis Date  . Anxiety   . Bradycardia   . Depression     Past Surgical History:  Procedure Laterality Date  . WISDOM TOOTH EXTRACTION      Family History: No family history on file.  Social History:  reports that she has never smoked. She has never used smokeless tobacco. She reports current alcohol use. She reports that she does not use drugs.  Additional Social History:  Alcohol / Drug Use Pain Medications: please see mar Prescriptions: please see mar Over the Counter: please see mar History of alcohol / drug use?: No history of alcohol / drug abuse Longest period of sobriety (when/how long): NA  CIWA: CIWA-Ar BP: 106/75 Pulse Rate: 86 COWS:    Allergies: No Known Allergies  Home Medications: (Not in a hospital admission)   OB/GYN Status:  No LMP recorded (lmp unknown).  General Assessment Data Location of Assessment: [facility] ED TTS Assessment: In system Is this a Tele or Face-to-Face Assessment?: Face-to-Face Is this an Initial Assessment or a Re-assessment for this encounter?: Initial Assessment Patient Accompanied by:: N/A Language Other than English: No Living Arrangements: Other (Comment) What gender do you identify as?: Female Marital status: Single Maiden name: NA Pregnancy Status: No Living Arrangements:  Other (Comment) Can pt return to current living arrangement?: Yes Admission Status: Involuntary Petitioner: Other Is patient capable of signing voluntary admission?: Yes Referral Source: Self/Family/Friend Insurance type: SP     Crisis Care Plan Living Arrangements: Other (Comment) Legal Guardian: Other:(self) Name of Psychiatrist: NA Name of Therapist: [facility]  Education Status Is patient currently in school?: No Is the patient employed, unemployed or receiving disability?: Employed  Risk to self with the past 6 months Suicidal Ideation: Yes-Currently Present Has patient been a risk to self within the past 6 months prior to admission? : No Suicidal Intent: Yes-Currently Present Has patient had any suicidal intent within the past 6 months prior to admission? : No Is patient at risk for suicide?: Yes Suicidal Plan?: Yes-Currently Present Has patient had any suicidal plan within the past 6 months prior to admission? : No Specify Current Suicidal Plan: Pt overdosed Access to Means: No What has been your use of drugs/alcohol within the last 12 months?: NA Previous Attempts/Gestures: No How many times?: 0 Other Self Harm Risks: Na Triggers for Past Attempts: None known Intentional Self Injurious Behavior: None Family Suicide History: No Recent stressful life event(s): Loss (Comment) Persecutory voices/beliefs?: No Depression: Yes Depression Symptoms: Isolating, Fatigue, Loss of interest in usual pleasures, Feeling worthless/self pity, Feeling angry/irritable Substance abuse history and/or treatment for substance abuse?: No Suicide prevention information given to non-admitted patients: Not applicable  Risk to Others within the past 6 months Homicidal Ideation: No Does patient have any lifetime risk of violence toward others beyond the six months prior to admission? : No Thoughts of Harm to Others: No Current Homicidal Intent: No  Current Homicidal Plan: No Access to Homicidal  Means: No Identified Victim: na History of harm to others?: No Assessment of Violence: None Noted Violent Behavior Description: n Does patient have access to weapons?: No Criminal Charges Pending?: No Does patient have a court date: No Is patient on probation?: No  Psychosis Hallucinations: None noted Delusions: None noted  Mental Status Report Appearance/Hygiene: Unremarkable Eye Contact: Fair Motor Activity: Freedom of movement Speech: Logical/coherent Level of Consciousness: Alert Mood: Depressed Affect: Depressed Anxiety Level: Severe Thought Processes: Coherent, Relevant Judgement: Impaired Orientation: Person, Place, Time, Situation Obsessive Compulsive Thoughts/Behaviors: None  Cognitive Functioning Concentration: Normal Memory: Recent Intact, Remote Intact Is patient IDD: No Insight: Fair Impulse Control: Fair Appetite: Fair Have you had any weight changes? : No Change Sleep: No Change Total Hours of Sleep: 8 Vegetative Symptoms: None  ADLScreening [facility] Assessment Services) Patient's cognitive ability adequate to safely complete daily activities?: Yes Patient able to express need for assistance with ADLs?: Yes Independently performs ADLs?: Yes (appropriate for developmental age)  Prior Inpatient Therapy Prior Inpatient Therapy: No  Prior Outpatient Therapy Prior Outpatient Therapy: Yes Prior Therapy Dates: current Prior Therapy Facilty/Provider(s): [facility] Reason for Treatment: depression Does patient have an ACCT team?: No Does patient have Intensive In-House Services?  : No Does patient have [facility] services? : No Does patient have P4CC services?: No  ADL Screening (condition at time of admission) Patient's cognitive ability adequate to safely complete daily activities?: Yes Is the patient deaf or have difficulty hearing?: No Does the patient have difficulty seeing, even when wearing glasses/contacts?: No Does the patient have difficulty  concentrating, remembering, or making decisions?: No Patient able to express need for assistance with ADLs?: Yes Does the patient have difficulty dressing or bathing?: No Independently performs ADLs?: Yes (appropriate for developmental age)       Abuse/Neglect Assessment (Assessment to be complete while patient is alone) Abuse/Neglect Assessment Can Be Completed: Yes Physical Abuse: Denies Verbal Abuse: Denies Sexual Abuse: Denies Exploitation of patient/patient's resources: Denies     Regulatory affairs officer (For Healthcare) Does Patient Have a Medical Advance Directive?: No Would patient like information on creating a medical advance directive?: No - Patient declined          Disposition:  Disposition Initial Assessment Completed for this Encounter: Yes  This service was provided via telemedicine using a 2-way, interactive audio and video technology.  Names of all persons participating in this telemedicine service and their role in this encounter. Name: Earleen Newport Role: NP  Name:  Role:   Name:  Role:   Name:  Role:     Cyndia Bent 10/28/2018 9:10 AM

## 2018-10-28 NOTE — Progress Notes (Signed)
RN received call from poison control requesting EKG. Hard copy in chart. Poison control  stated will not need another an EKG in the morning 10/23. No c/o from pt. Pt able to call mother. Pt alert & oriented. Will contunie to monitor.

## 2018-10-28 NOTE — ED Notes (Signed)
Updated poison control, QT/QTC 384/510, QRS 85. Since the Tylenol level was basically a 7 1/2 hour tylenol, the level will not need to be treated. Her liver function looks okay (ALT and AST), QRS is normal and the patient is not acidotic. The electrolytes will fix the prolonged QTC with the potassium and magnesium supplementation. The patient does not really need bicarbonate, per PC. They suggest fluids, a repeat EKG in 4-6 hours and then observation until the patient is back to normal.

## 2018-10-28 NOTE — ED Notes (Signed)
Belongings including 1 patient bag with clothing, cell phone, and jewelry placed in Res cabinet.

## 2018-10-28 NOTE — ED Triage Notes (Signed)
Pt arrives via EMS from home, C/C overdose/ SI attempt. Patient had a suicide note on the bed next to her that documented what she overdosed on tonight. She states she took these meds around 9:30 pm last night, EMS was called in at 3 am. Reports taking 150 mg trazodone (10 tablets), 30 mg Cymbalta (23 tabs), 9 tabs Excedrin, 12 tabs Mucinex, and 2-3 bottles of wine in an extensive SI note listed to friends, family, GOD and herself.

## 2018-10-28 NOTE — ED Provider Notes (Signed)
Pt signed out by Dr. Randal Buba.  Pt's labs are ok.  She is not toxic from tylenol and salicylates.  However, pt's QTc is not back to nl.  Pt's QRS is ok.  I spoke to poison control.  They recommended stopping the bicarb drip, but admit and observe until the QTc is back to nl.  Pt d/w Dr. Rodena Piety ([facility]) for admission.  CRITICAL CARE Performed by: Isla Pence   Total critical care time: 30 minutes  Critical care time was exclusive of separately billable procedures and treating other patients.  Critical care was necessary to treat or prevent imminent or life-threatening deterioration.  Critical care was time spent personally by me on the following activities: development of treatment plan with patient and/or surrogate as well as nursing, discussions with consultants, evaluation of patient's response to treatment, examination of patient, obtaining history from patient or surrogate, ordering and performing treatments and interventions, ordering and review of laboratory studies, ordering and review of radiographic studies, pulse oximetry and re-evaluation of patient's condition.   Isla Pence, MD 10/28/18 1019

## 2018-10-28 NOTE — ED Notes (Signed)
Patient gave permission for RN to update mom and call her.

## 2018-10-28 NOTE — ED Notes (Signed)
Patient's mother reports that this is patient's second attempt to overdose. Patient's mother reports in 2018 she attempted in [place]. Patient reportedly attended AA after her previous attempt. Patient reportedly has been sober and doing well, but in 09/27/22 the "love of her life" died of unknown cause and she has been in a tailspin since and has been depressed and started drinking again.   [phone] Elisabeth Cara, Patient's mother

## 2018-10-28 NOTE — ED Provider Notes (Signed)
[facility]-EMERGENCY DEPT Provider Note   CSN: [id number] Arrival date & time: 10/28/18  0341     History   Chief Complaint Chief Complaint  Patient presents with  . Drug Overdose  . Suicidal    HPI Grace Grant is a 28 y.o. female.     The history is provided by the police, the EMS personnel and medical records. The history is limited by the condition of the patient.  Drug Overdose This is a new problem. The current episode started 6 to 12 hours ago. The problem occurs constantly. The problem has not changed since onset.Pertinent negatives include no chest pain, no abdominal pain, no headaches and no shortness of breath. Nothing aggravates the symptoms. Nothing relieves the symptoms. She has tried nothing for the symptoms. The treatment provided no relief.  Patient took trazedone, mucinex, cymbalta, ibuprofen, tylenol and excedrin along with 2-3 bottles of wine to kill herself.  She left a suicide note.    History reviewed. No pertinent past medical history.  There are no active problems to display for this patient.   Past Surgical History:  Procedure Laterality Date  . WISDOM TOOTH EXTRACTION       OB History    Gravida      Para      Term      Preterm      AB      Living  0     SAB      TAB      Ectopic      Multiple      Live Births               Home Medications    Prior to Admission medications   Medication Sig Start Date End Date Taking? Authorizing Provider  busPIRone (BUSPAR) 10 MG tablet Take 10 mg by mouth 3 (three) times daily.    [provider]  FLUoxetine HCl (PROZAC PO) Take 1 Can by mouth daily.     [provider]  PRESCRIPTION MEDICATION Take 1 tablet by mouth daily.    [provider]  traZODone (DESYREL) 150 MG tablet Take 75 mg by mouth at bedtime.    [provider]    Family History No family history on file.  Social History Social History    Tobacco Use  . Smoking status: Never Smoker  . Smokeless tobacco: Never Used  Substance Use Topics  . Alcohol use: Not Currently  . Drug use: Never     Allergies   Patient has no known allergies.   Review of Systems Review of Systems  Unable to perform ROS: Acuity of condition  Respiratory: Negative for shortness of breath.   Cardiovascular: Negative for chest pain.  Gastrointestinal: Negative for abdominal pain.  Neurological: Negative for headaches.  Psychiatric/Behavioral: Positive for self-injury and suicidal ideas.     Physical Exam Updated Vital Signs There were no vitals taken for this visit.  Physical Exam Vitals signs and nursing note reviewed.  Constitutional:      Appearance: She is normal weight. She is not diaphoretic.  HENT:     Head: Normocephalic and atraumatic.     Nose: Nose normal.     Mouth/Throat:     Mouth: Mucous membranes are moist.     Pharynx: Oropharynx is clear.  Eyes:     Extraocular Movements: Extraocular movements intact.     Conjunctiva/sclera: Conjunctivae normal.     Pupils: Pupils are equal, round, and reactive  to light.  Neck:     Musculoskeletal: Normal range of motion and neck supple.  Cardiovascular:     Rate and Rhythm: Normal rate and regular rhythm.     Pulses: Normal pulses.     Heart sounds: Normal heart sounds.  Pulmonary:     Effort: Pulmonary effort is normal.     Breath sounds: Normal breath sounds.  Abdominal:     General: Abdomen is flat. Bowel sounds are normal.     Tenderness: There is no abdominal tenderness. There is no guarding.  Musculoskeletal: Normal range of motion.  Skin:    General: Skin is warm and dry.     Capillary Refill: Capillary refill takes less than 2 seconds.  Neurological:     General: No focal deficit present.     Mental Status: She is alert and oriented to person, place, and time.  Psychiatric:        Behavior: Behavior is aggressive.        Thought Content: Thought content  includes suicidal ideation. Thought content includes suicidal plan.        Judgment: Judgment is impulsive.      ED Treatments / Results  Labs (all labs ordered are listed, but only abnormal results are displayed) Results for orders placed or performed during the hospital encounter of 10/28/18  SARS Coronavirus 2 by RT PCR (hospital order, [facility]) Nasopharyngeal Nasopharyngeal Swab   Specimen: Nasopharyngeal Swab  Result Value Ref Range   SARS Coronavirus 2 NEGATIVE NEGATIVE  Comprehensive metabolic panel  Result Value Ref Range   Sodium 141 135 - 145 mmol/L   Potassium 3.4 (L) 3.5 - 5.1 mmol/L   Chloride 108 98 - 111 mmol/L   CO2 19 (L) 22 - 32 mmol/L   Glucose, Bld 112 (H) 70 - 99 mg/dL   BUN 9 6 - 20 mg/dL   Creatinine, Ser 0.980.87 0.44 - 1.00 mg/dL   Calcium 8.4 (L) 8.9 - 10.3 mg/dL   Total Protein 7.1 6.5 - 8.1 g/dL   Albumin 4.0 3.5 - 5.0 g/dL   AST 14 (L) 15 - 41 U/L   ALT 11 0 - 44 U/L   Alkaline Phosphatase 29 (L) 38 - 126 U/L   Total Bilirubin 0.8 0.3 - 1.2 mg/dL   GFR calc non Af Amer >60 >60 mL/min   GFR calc Af Amer >60 >60 mL/min   Anion gap 14 5 - 15  Salicylate level  Result Value Ref Range   Salicylate Lvl <7.0 2.8 - 30.0 mg/dL  Acetaminophen level  Result Value Ref Range   Acetaminophen (Tylenol), Serum 41 (H) 10 - 30 ug/mL  Ethanol  Result Value Ref Range   Alcohol, Ethyl (B) 152 (H) <10 mg/dL  Urine rapid drug screen (hosp performed)  Result Value Ref Range   Opiates NONE DETECTED NONE DETECTED   Cocaine NONE DETECTED NONE DETECTED   Benzodiazepines NONE DETECTED NONE DETECTED   Amphetamines POSITIVE (A) NONE DETECTED   Tetrahydrocannabinol NONE DETECTED NONE DETECTED   Barbiturates NONE DETECTED NONE DETECTED  CBC WITH DIFFERENTIAL  Result Value Ref Range   WBC 3.2 (L) 4.0 - 10.5 K/uL   RBC 4.68 3.87 - 5.11 MIL/uL   Hemoglobin 13.9 12.0 - 15.0 g/dL   HCT 11.941.7 14.736.0 - 82.946.0 %   MCV 89.1 80.0 - 100.0 fL   MCH 29.7  26.0 - 34.0 pg   MCHC 33.3 30.0 - 36.0 g/dL   RDW 13.1  11.5 - 15.5 %   Platelets 210 150 - 400 K/uL   nRBC 0.0 0.0 - 0.2 %   Neutrophils Relative % 33 %   Neutro Abs 1.1 (L) 1.7 - 7.7 K/uL   Lymphocytes Relative 59 %   Lymphs Abs 1.9 0.7 - 4.0 K/uL   Monocytes Relative 4 %   Monocytes Absolute 0.1 0.1 - 1.0 K/uL   Eosinophils Relative 3 %   Eosinophils Absolute 0.1 0.0 - 0.5 K/uL   Basophils Relative 1 %   Basophils Absolute 0.0 0.0 - 0.1 K/uL   Immature Granulocytes 0 %   Abs Immature Granulocytes 0.01 0.00 - 0.07 K/uL  Magnesium  Result Value Ref Range   Magnesium 2.1 1.7 - 2.4 mg/dL  CBG monitoring, ED  Result Value Ref Range   Glucose-Capillary 99 70 - 99 mg/dL   Comment 1 Notify RN   I-Stat beta hCG blood, ED  Result Value Ref Range   I-stat hCG, quantitative <5.0 <5 mIU/mL   Comment 3           Dg Chest Portable 1 View  Result Date: 10/28/2018 CLINICAL DATA:  Overdose EXAM: PORTABLE CHEST 1 VIEW COMPARISON:  None. FINDINGS: The heart size and mediastinal contours are within normal limits. Both lungs are clear. The visualized skeletal structures are unremarkable. Artifact from EKG leads IMPRESSION: No active disease. Electronically Signed   By: Monte Fantasia M.D.   On: 10/28/2018 04:35    EKG  EKG Interpretation  Date/Time:  Thursday October 28 2018 04:01:52 EDT Ventricular Rate:  111 PR Interval:  122 QRS Duration: 80 QT Interval:  398 QTC Calculation: 541 R Axis:   80 Text Interpretation:  Sinus tachycardia Prolonged QT Confirmed by Dory Horn) on 10/28/2018 4:23:36 AM       Radiology No results found.  Procedures Procedures (including critical care time)  Medications Ordered in ED Medications  sodium bicarbonate 150 mEq in sterile water 1,000 mL infusion ( Intravenous New Bag/Given 10/28/18 0444)  potassium chloride 10 mEq in 100 mL IVPB (10 mEq Intravenous New Bag/Given 10/28/18 0510)  sodium bicarbonate injection 50 mEq (50 mEq  Intravenous Given 10/28/18 0441)  magnesium sulfate IVPB 1 g 100 mL (1 g Intravenous New Bag/Given 10/28/18 0177)   MDM Reviewed: nursing note and vitals Interpretation: labs, ECG and x-ray (NACPD on cxr by me, K slightly low by me ) Total time providing critical care: 30-74 minutes (bicarb drip for prolonged QT and electrolyte replacement per poison control). This excludes time spent performing separately reportable procedures and services.   CRITICAL CARE Performed by: Arleth Mccullar K Kelsey Edman-Rasch Total critical care time: 61 minutes Critical care time was exclusive of separately billable procedures and treating other patients. Critical care was necessary to treat or prevent imminent or life-threatening deterioration. Critical care was time spent personally by me on the following activities: development of treatment plan with patient and/or surrogate as well as nursing, discussions with consultants, evaluation of patient's response to treatment, examination of patient, obtaining history from patient or surrogate, ordering and performing treatments and interventions, ordering and review of laboratory studies, ordering and review of radiographic studies, pulse oximetry and re-evaluation of patient's condition.    Final Clinical Impressions(s) / ED Diagnoses   Signed out to Dr. Gilford Raid pending repeat labs and clearance for psychiatry   Ashleen Demma, MD 10/28/18 [phone]

## 2018-10-29 DIAGNOSIS — F331 Major depressive disorder, recurrent, moderate: Secondary | ICD-10-CM | POA: Diagnosis present

## 2018-10-29 DIAGNOSIS — F330 Major depressive disorder, recurrent, mild: Secondary | ICD-10-CM

## 2018-10-29 DIAGNOSIS — F332 Major depressive disorder, recurrent severe without psychotic features: Secondary | ICD-10-CM | POA: Diagnosis present

## 2018-10-29 HISTORY — DX: Major depressive disorder, recurrent, mild: F33.0

## 2018-10-29 LAB — COMPREHENSIVE METABOLIC PANEL
ALT: 12 U/L (ref 0–44)
AST: 15 U/L (ref 15–41)
Albumin: 2.9 g/dL — ABNORMAL LOW (ref 3.5–5.0)
Alkaline Phosphatase: 20 U/L — ABNORMAL LOW (ref 38–126)
Anion gap: 8 (ref 5–15)
BUN: 6 mg/dL (ref 6–20)
CO2: 18 mmol/L — ABNORMAL LOW (ref 22–32)
Calcium: 7.8 mg/dL — ABNORMAL LOW (ref 8.9–10.3)
Chloride: 110 mmol/L (ref 98–111)
Creatinine, Ser: 0.76 mg/dL (ref 0.44–1.00)
GFR calc Af Amer: >60 mL/min (ref >60)
GFR calc non Af Amer: >60 mL/min (ref >60)
Glucose, Bld: 69 mg/dL — ABNORMAL LOW (ref 70–99)
Potassium: 4 mmol/L (ref 3.5–5.1)
Sodium: 136 mmol/L (ref 135–145)
Total Bilirubin: 1.1 mg/dL (ref 0.3–1.2)
Total Protein: 5.2 g/dL — ABNORMAL LOW (ref 6.5–8.1)

## 2018-10-29 LAB — BASIC METABOLIC PANEL
Anion gap: 6 (ref 5–15)
BUN: 7 mg/dL (ref 6–20)
CO2: 23 mmol/L (ref 22–32)
Calcium: 8.1 mg/dL — ABNORMAL LOW (ref 8.9–10.3)
Chloride: 108 mmol/L (ref 98–111)
Creatinine, Ser: 0.76 mg/dL (ref 0.44–1.00)
GFR calc Af Amer: >60 mL/min (ref >60)
GFR calc non Af Amer: >60 mL/min (ref >60)
Glucose, Bld: 150 mg/dL — ABNORMAL HIGH (ref 70–99)
Potassium: 4.1 mmol/L (ref 3.5–5.1)
Sodium: 137 mmol/L (ref 135–145)

## 2018-10-29 LAB — CBC
HCT: 35.7 % — ABNORMAL LOW (ref 36.0–46.0)
Hemoglobin: 11.5 g/dL — ABNORMAL LOW (ref 12.0–15.0)
MCH: 29.7 pg (ref 26.0–34.0)
MCHC: 32.2 g/dL (ref 30.0–36.0)
MCV: 92.2 fL (ref 80.0–100.0)
Platelets: 163 10*3/uL (ref 150–400)
RBC: 3.87 MIL/uL (ref 3.87–5.11)
RDW: 13.1 % (ref 11.5–15.5)
WBC: 4.4 10*3/uL (ref 4.0–10.5)
nRBC: 0 % (ref 0.0–0.2)

## 2018-10-29 LAB — LACTIC ACID, PLASMA: Lactic Acid, Venous: 0.8 mmol/L (ref 0.5–1.9)

## 2018-10-29 MED ORDER — PHENOL 1.4 % MT LIQD
1.0000 | OROMUCOSAL | Status: DC | PRN
  Administered 2018-10-29: 1 via OROMUCOSAL
  Filled 2018-10-29: qty 177

## 2018-10-29 MED ORDER — MUSCLE RUB 10-15 % EX CREA
TOPICAL_CREAM | CUTANEOUS | Status: DC | PRN
  Filled 2018-10-29: qty 85

## 2018-10-29 MED ORDER — ZOLPIDEM TARTRATE 5 MG PO TABS
5.0000 mg | ORAL_TABLET | Freq: Every evening | ORAL | Status: DC | PRN
  Administered 2018-10-29 – 2018-10-30 (×2): 5 mg via ORAL
  Filled 2018-10-29 (×2): qty 1

## 2018-10-29 MED ORDER — LACTATED RINGERS IV SOLN
INTRAVENOUS | Status: DC
  Administered 2018-10-29: 17:00:00 via INTRAVENOUS

## 2018-10-29 MED ORDER — LIP MEDEX EX OINT
TOPICAL_OINTMENT | CUTANEOUS | Status: DC | PRN
  Administered 2018-10-29: 1 via TOPICAL

## 2018-10-29 MED ORDER — LACTATED RINGERS IV BOLUS
1000.0000 mL | INTRAVENOUS | Status: AC
  Administered 2018-10-29 (×2): 1000 mL via INTRAVENOUS

## 2018-10-29 MED ORDER — LACTATED RINGERS IV SOLN: INTRAVENOUS | Status: DC

## 2018-10-29 MED ORDER — LIP MEDEX EX OINT
TOPICAL_OINTMENT | CUTANEOUS | Status: AC
  Filled 2018-10-29: qty 7

## 2018-10-29 NOTE — Progress Notes (Signed)
[facility] Progress Note  Patient: Grace Grant WUJ:[id number]   PCP: Sigmund Hazel, MD DOB: [birth year]   DOA: 10/28/2018   DOS: 10/29/2018   Date of Service: the patient was seen and examined on 10/29/2018  Chief Complaint  Patient presents with  . Drug Overdose  . Suicidal   Brief hospital course: Pt. with PMH of depression; presented with complain of drug overdose, was found to have prolonged QTC secondary to drug overdose and dehydration with orthostatic hypotension.  Currently further plan is continue supportive care and transition to behavioral health once bed is available.  Patient is medically ready to be transferred to behavioral health after receiving IV hydration this morning  Subjective: Reports fatigue and tiredness.  Reports dizziness when she changes her position.  No chest pain abdominal pain.  Oral intake is adequate.  No diarrhea.  Assessment and Plan: #1 drug overdose with suicidal intention- patient took unknown quantity of Excedrin, trazodone, Cymbalta, Tylenol, and ibuprofen. She now reports that she does not want to die she wants to live.  She was grieving about recent loss of her fianc 3 weeks ago. Poison control was consulted. Maintain K more than 4 mag more than 2. EKG now shows QTC is corrected. No further monitoring from poison control. Psychiatry consulted who recommends behavioral health transfer once bed is available. Patient is medically cleared. Discontinue telemetry.  Orthostatic hypotension. From poor p.o. intake. Aggressively hydrated with IV fluid. Orthostasis currently resolved. We will recheck tomorrow morning.   anxiety depression patient takes BuSpar and trazodone at home.  This will be on hold.  hypokalemia replaced  Diet: Regular diet  DVT Prophylaxis: Subcutaneous Lovenox  Advance goals of care discussion: Full code  Family Communication: no family was present at bedside, at the time of interview.    Disposition:  Discharge to behavioral health, cannot leave AMA.  Consultants: Psychiatric Procedures: None  Scheduled Meds: . Chlorhexidine Gluconate Cloth  6 each Topical Daily  . enoxaparin (LOVENOX) injection  40 mg Subcutaneous Q24H  . nicotine  14 mg Transdermal Daily   Continuous Infusions: . lactated ringers 100 mL/hr at 10/29/18 1640   PRN Meds: alum & mag hydroxide-simeth, lip balm, Muscle Rub, ondansetron, phenol Antibiotics: Anti-infectives (From admission, onward)   None       Objective: Physical Exam: Vitals:   10/28/18 1948 10/29/18 0500 10/29/18 1321 10/29/18 2000  BP: 112/74 120/90 106/70   Pulse: 83 63 75 66  Resp: 16 18 16    Temp: 99.2 F (37.3 C) 98.5 F (36.9 C) 98.7 F (37.1 C) 98.7 F (37.1 C)  TempSrc: Oral Oral Oral Oral  SpO2: 98% 99% 98% 100%  Weight:      Height:        Intake/Output Summary (Last 24 hours) at 10/29/2018 2114 Last data filed at 10/29/2018 1814 Gross per 24 hour  Intake 4730.19 ml  Output 2325 ml  Net 2405.19 ml   Filed Weights   10/28/18 0418  Weight: 54.4 kg   General: alert and oriented to time, place, and person. Appear in moderate distress, affect appropriate Eyes: PERRL, Conjunctiva normal ENT: Oral Mucosa Clear, dry  Neck: no JVD, no Abnormal Mass Or lumps Cardiovascular: S1 and S2 Present, no Murmur, peripheral pulses symmetrical Respiratory: good respiratory effort, Bilateral Air entry equal and Decreased, no signs of accessory muscle use, Clear to Auscultation, no Crackles, no wheezes Abdomen: Bowel Sound present, Soft and no tenderness, no hernia Skin: no rashes  Extremities: no Pedal edema, no calf  tenderness Neurologic: without any new focal findings Gait not checked due to patient safety concerns  Data Reviewed: I have personally reviewed and interpreted daily labs, tele strips, imagings as discussed above. I reviewed all nursing notes, pharmacy notes, vitals, pertinent old records I have  discussed plan of care as described above with RN and patient/family.  CBC: Recent Labs  Lab 10/28/18 0403 10/29/18 0545  WBC 3.2* 4.4  NEUTROABS 1.1*  --   HGB 13.9 11.5*  HCT 41.7 35.7*  MCV 89.1 92.2  PLT 210 626   Basic Metabolic Panel: Recent Labs  Lab 10/28/18 0403 10/28/18 1109 10/29/18 0545 10/29/18 1353  NA 141 139 136 137  K 3.4* 4.3 4.0 4.1  CL 108 108 110 108  CO2 19* 22 18* 23  GLUCOSE 112* 81 69* 150*  BUN 9 7 6 7   CREATININE 0.87 0.76  0.76 0.76 0.76  CALCIUM 8.4* 7.9* 7.8* 8.1*  MG 2.1 2.1  --   --     Liver Function Tests: Recent Labs  Lab 10/28/18 0403 10/28/18 1109 10/29/18 0545  AST 14* 18 15  ALT 11 11 12   ALKPHOS 29* 25* 20*  BILITOT 0.8 0.6 1.1  PROT 7.1 6.0* 5.2*  ALBUMIN 4.0 3.5 2.9*   No results for input(s): LIPASE, AMYLASE in the last 168 hours. No results for input(s): AMMONIA in the last 168 hours. Coagulation Profile: No results for input(s): INR, PROTIME in the last 168 hours. Cardiac Enzymes: No results for input(s): CKTOTAL, CKMB, CKMBINDEX, TROPONINI in the last 168 hours. BNP (last 3 results) No results for input(s): PROBNP in the last 8760 hours. CBG: Recent Labs  Lab 10/28/18 0412  GLUCAP 99   Studies: No results found.   Time spent: 35 minutes  Author: Berle Mull, MD [facility] 10/29/2018 9:14 PM  To reach On-call, see care teams to locate the attending and reach out to them via [email or web address]. If 7PM-7AM, please contact night-coverage If you still have difficulty reaching the attending provider, please page the [facility] (Director on Call) for [facility] on amion for assistance.

## 2018-10-29 NOTE — Progress Notes (Signed)
Patient needs psychiatric admission for stabilization.  [facility] called [phone]) and assessing the patient for a bed.  Waylan Boga, PMHNP

## 2018-10-29 NOTE — Consult Note (Signed)
[facility] Face-to-Face Psychiatry Consult   Reason for Consult:  Suicide attempt Referring Physician:  Dr Lynden Oxford Patient Identification: Grace Grant MRN:  [id number] Principal Diagnosis: Major depressive disorder, recurrent severe without psychotic features (HCC) Diagnosis:  Principal Problem:   Major depressive disorder, recurrent severe without psychotic features (HCC) Active Problems:   OD (overdose of drug)   Hypokalemia   Anxiety   Depression   Total Time spent with patient: 1 hour  Subjective:   Grace Grant is a 28 y.o. female patient admitted with suicide attempt.  Patient seen and evaluated in person by this provider.  She reports her depression started a month ago when when she found her fianc down on the couch.  Her mother was staying with her until this past weekend and return to [place].  She has been in recovery from alcohol abuse for the past 18 months until Monday night.  She started to drink again and on Wednesday night took the overdose.  When she awakened to her cat, she realized she wanted to live and called for help.  Tearful on assessment and reports she does want to live but struggling with coping.  HPI per MD:  Grace Grant is a 28 y.o. female with  past medical history significant for depression took 10 tablets of 150 mg trazodone, 23 tablets of 30 mg Cymbalta, 9 tablets of Excedrin, 12 tablets of Mucinex and 2-3 bottles of wine to kill herself at 9:30 PM last night.this am she called EMS. She has been prescribed trazadone and buspar. Her fiancee passed away 3 weeks ago due to unknown reason.  She tells me today she wants to live. She feels anxious jittery and nauseous, denies vomiting.  No diarrhea.  She denies fever chills cough.\ She denies suicidal ideation when I saw her in the ER. Patient has a history of prior suicidal attempt in 2018 per mother.  ED physician Dr. Daun Peacock discussed with poison control early this morning they  recommended to start the patient on bicarb drip which she received her potassium was replaced. ED physician Dr. Particia Nearing spoke with poison control prior to calling me for admissions and they recommended to DC bicarb drip.  Poison control recommended to admit and observe until the QTC is back to normal.  Past Psychiatric History: depression, alcohol dependence  Risk to Self: Suicidal Ideation: Yes-Currently Present Suicidal Intent: Yes-Currently Present Is patient at risk for suicide?: Yes Suicidal Plan?: Yes-Currently Present Specify Current Suicidal Plan: Pt overdosed Access to Means: No What has been your use of drugs/alcohol within the last 12 months?: NA How many times?: 0 Other Self Harm Risks: Na Triggers for Past Attempts: None known Intentional Self Injurious Behavior: None Risk to Others: Homicidal Ideation: No Thoughts of Harm to Others: No Current Homicidal Intent: No Current Homicidal Plan: No Access to Homicidal Means: No Identified Victim: na History of harm to others?: No Assessment of Violence: None Noted Violent Behavior Description: n Does patient have access to weapons?: No Criminal Charges Pending?: No Does patient have a court date: No Prior Inpatient Therapy: Prior Inpatient Therapy: No Prior Outpatient Therapy: Prior Outpatient Therapy: Yes Prior Therapy Dates: current Prior Therapy Facilty/Provider(s): [facility] Reason for Treatment: depression Does patient have an ACCT team?: No Does patient have Intensive In-House Services?  : No Does patient have [facility] services? : No Does patient have P4CC services?: No  Past Medical History:  Past Medical History:  Diagnosis Date  . Anxiety   . Bradycardia   . Depression  Past Surgical History:  Procedure Laterality Date  . WISDOM TOOTH EXTRACTION     Family History: No family history on file. Family Psychiatric  History: none Social History:  Social History   Substance and Sexual Activity  Alcohol  Use Yes   Comment: occ     Social History   Substance and Sexual Activity  Drug Use Never    Social History   Socioeconomic History  . Marital status: Single    Spouse name: Not on file  . Number of children: Not on file  . Years of education: Not on file  . Highest education level: Not on file  Occupational History  . Not on file  Social Needs  . Financial resource strain: Not on file  . Food insecurity    Worry: Not on file    Inability: Not on file  . Transportation needs    Medical: Not on file    Non-medical: Not on file  Tobacco Use  . Smoking status: Never Smoker  . Smokeless tobacco: Never Used  Substance and Sexual Activity  . Alcohol use: Yes    Comment: occ  . Drug use: Never  . Sexual activity: Yes    Birth control/protection: Pill  Lifestyle  . Physical activity    Days per week: Not on file    Minutes per session: Not on file  . Stress: Not on file  Relationships  . Social Musician on phone: Not on file    Gets together: Not on file    Attends religious service: Not on file    Active member of club or organization: Not on file    Attends meetings of clubs or organizations: Not on file    Relationship status: Not on file  Other Topics Concern  . Not on file  Social History Narrative  . Not on file   Additional Social History:    Allergies:  No Known Allergies  Labs:  Results for orders placed or performed during the hospital encounter of 10/28/18 (from the past 48 hour(s))  Comprehensive metabolic panel     Status: Abnormal   Collection Time: 10/28/18  4:03 AM  Result Value Ref Range   Sodium 141 135 - 145 mmol/L   Potassium 3.4 (L) 3.5 - 5.1 mmol/L   Chloride 108 98 - 111 mmol/L   CO2 19 (L) 22 - 32 mmol/L   Glucose, Bld 112 (H) 70 - 99 mg/dL   BUN 9 6 - 20 mg/dL   Creatinine, Ser 1.61 0.44 - 1.00 mg/dL   Calcium 8.4 (L) 8.9 - 10.3 mg/dL   Total Protein 7.1 6.5 - 8.1 g/dL   Albumin 4.0 3.5 - 5.0 g/dL   AST 14 (L) 15 -  41 U/L   ALT 11 0 - 44 U/L   Alkaline Phosphatase 29 (L) 38 - 126 U/L   Total Bilirubin 0.8 0.3 - 1.2 mg/dL   GFR calc non Af Amer >60 >60 mL/min   GFR calc Af Amer >60 >60 mL/min   Anion gap 14 5 - 15    Comment: Performed at [facility], [address] [place], [place] [address]  Salicylate level     Status: None   Collection Time: 10/28/18  4:03 AM  Result Value Ref Range   Salicylate Lvl <7.0 2.8 - 30.0 mg/dL    Comment: Performed at [facility], [address] [place], [place] [address]  Acetaminophen level  Status: Abnormal   Collection Time: 10/28/18  4:03 AM  Result Value Ref Range   Acetaminophen (Tylenol), Serum 41 (H) 10 - 30 ug/mL    Comment: (NOTE) Therapeutic concentrations vary significantly. A range of 10-30 ug/mL  may be an effective concentration for many patients. However, some  are best treated at concentrations outside of this range. Acetaminophen concentrations >150 ug/mL at 4 hours after ingestion  and >50 ug/mL at 12 hours after ingestion are often associated with  toxic reactions. Performed at [facility], [place] [address] [place], [place] [address]   Ethanol     Status: Abnormal   Collection Time: 10/28/18  4:03 AM  Result Value Ref Range   Alcohol, Ethyl (B) 152 (H) <10 mg/dL    Comment: (NOTE) Lowest detectable limit for serum alcohol is 10 mg/dL. For medical purposes only. Performed at [facility], [place] [address] [place], [place] [address]   Urine rapid drug screen (hosp performed)     Status: Abnormal   Collection Time: 10/28/18  4:03 AM  Result Value Ref Range   Opiates NONE DETECTED NONE DETECTED   Cocaine NONE DETECTED NONE DETECTED   Benzodiazepines NONE DETECTED NONE DETECTED   Amphetamines POSITIVE (A) NONE DETECTED   Tetrahydrocannabinol NONE DETECTED NONE DETECTED   Barbiturates NONE DETECTED NONE DETECTED    Comment: (NOTE) DRUG SCREEN FOR MEDICAL  PURPOSES ONLY.  IF CONFIRMATION IS NEEDED FOR ANY PURPOSE, NOTIFY LAB WITHIN 5 DAYS. LOWEST DETECTABLE LIMITS FOR URINE DRUG SCREEN Drug Class                     Cutoff (ng/mL) Amphetamine and metabolites    1000 Barbiturate and metabolites    200 Benzodiazepine                 677 Tricyclics and metabolites     300 Opiates and metabolites        300 Cocaine and metabolites        300 THC                            50 Performed at [facility], [place] [address], [place], [place] [address]   CBC WITH DIFFERENTIAL     Status: Abnormal   Collection Time: 10/28/18  4:03 AM  Result Value Ref Range   WBC 3.2 (L) 4.0 - 10.5 K/uL   RBC 4.68 3.87 - 5.11 MIL/uL   Hemoglobin 13.9 12.0 - 15.0 g/dL   HCT 41.7 36.0 - 46.0 %   MCV 89.1 80.0 - 100.0 fL   MCH 29.7 26.0 - 34.0 pg   MCHC 33.3 30.0 - 36.0 g/dL   RDW 13.1 11.5 - 15.5 %   Platelets 210 150 - 400 K/uL   nRBC 0.0 0.0 - 0.2 %   Neutrophils Relative % 33 %   Neutro Abs 1.1 (L) 1.7 - 7.7 K/uL   Lymphocytes Relative 59 %   Lymphs Abs 1.9 0.7 - 4.0 K/uL   Monocytes Relative 4 %   Monocytes Absolute 0.1 0.1 - 1.0 K/uL   Eosinophils Relative 3 %   Eosinophils Absolute 0.1 0.0 - 0.5 K/uL   Basophils Relative 1 %   Basophils Absolute 0.0 0.0 - 0.1 K/uL   Immature Granulocytes 0 %   Abs Immature Granulocytes 0.01 0.00 - 0.07 K/uL    Comment: Performed at [facility], [place]  [address] [place], [place] [address]  SARS Coronavirus 2 by RT PCR (hospital order, performed in [facility] hospital lab) Nasopharyngeal Nasopharyngeal Swab     Status: None   Collection Time: 10/28/18  4:03 AM   Specimen: Nasopharyngeal Swab  Result Value Ref Range   SARS Coronavirus 2 NEGATIVE NEGATIVE    Comment: (NOTE) If result is NEGATIVE SARS-CoV-2 target nucleic acids are NOT DETECTED. The SARS-CoV-2 RNA is generally detectable in upper and lower  respiratory specimens during the acute phase of infection. The lowest   concentration of SARS-CoV-2 viral copies this assay can detect is 250  copies / mL. A negative result does not preclude SARS-CoV-2 infection  and should not be used as the sole basis for treatment or other  patient management decisions.  A negative result may occur with  improper specimen collection / handling, submission of specimen other  than nasopharyngeal swab, presence of viral mutation(s) within the  areas targeted by this assay, and inadequate number of viral copies  (<250 copies / mL). A negative result must be combined with clinical  observations, patient history, and epidemiological information. If result is POSITIVE SARS-CoV-2 target nucleic acids are DETECTED. The SARS-CoV-2 RNA is generally detectable in upper and lower  respiratory specimens dur ing the acute phase of infection.  Positive  results are indicative of active infection with SARS-CoV-2.  Clinical  correlation with patient history and other diagnostic information is  necessary to determine patient infection status.  Positive results do  not rule out bacterial infection or co-infection with other viruses. If result is PRESUMPTIVE POSTIVE SARS-CoV-2 nucleic acids MAY BE PRESENT.   A presumptive positive result was obtained on the submitted specimen  and confirmed on repeat testing.  While 2019 novel coronavirus  (SARS-CoV-2) nucleic acids may be present in the submitted sample  additional confirmatory testing may be necessary for epidemiological  and / or clinical management purposes  to differentiate between  SARS-CoV-2 and other Sarbecovirus currently known to infect humans.  If clinically indicated additional testing with an alternate test  methodology [phone] is advised. The SARS-CoV-2 RNA is generally  detectable in upper and lower respiratory sp ecimens during the acute  phase of infection. The expected result is Negative. Fact Sheet for Patients:  BoilerBrush.com.cyhttps://www.fda.gov/media/136312/download Fact Sheet  for Healthcare Providers: https://pope.com/https://www.fda.gov/media/136313/download This test is not yet approved or cleared by the [place] FDA and has been authorized for detection and/or diagnosis of SARS-CoV-2 by FDA under an Emergency Use Authorization (EUA).  This EUA will remain in effect (meaning this test can be used) for the duration of the COVID-19 declaration under Section 564(b)(1) of the Act, 21 U.S.C. section 360bbb-3(b)(1), unless the authorization is terminated or revoked sooner. Performed at [facility], [address] [place], [place] [address]   Magnesium     Status: None   Collection Time: 10/28/18  4:03 AM  Result Value Ref Range   Magnesium 2.1 1.7 - 2.4 mg/dL    Comment: Performed at [facility], [address] [place], [place] [address]  I-Stat beta hCG blood, ED     Status: None   Collection Time: 10/28/18  4:08 AM  Result Value Ref Range   I-stat hCG, quantitative <5.0 <5 mIU/mL   Comment 3            Comment:   GEST. AGE      CONC.  (mIU/mL)   <=1 WEEK        5 -  50     2 WEEKS       50 - 500     3 WEEKS       100 - 10,000     4 WEEKS     1,000 - 30,000        FEMALE AND NON-PREGNANT FEMALE:     LESS THAN 5 mIU/mL   CBG monitoring, ED     Status: None   Collection Time: 10/28/18  4:12 AM  Result Value Ref Range   Glucose-Capillary 99 70 - 99 mg/dL   Comment 1 Notify RN   Acetaminophen level     Status: Abnormal   Collection Time: 10/28/18  7:15 AM  Result Value Ref Range   Acetaminophen (Tylenol), Serum 35 (H) 10 - 30 ug/mL    Comment: (NOTE) Therapeutic concentrations vary significantly. A range of 10-30 ug/mL  may be an effective concentration for many patients. However, some  are best treated at concentrations outside of this range. Acetaminophen concentrations >150 ug/mL at 4 hours after ingestion  and >50 ug/mL at 12 hours after ingestion are often associated with  toxic reactions. Performed at [facility], [address] [place], [place] [address]   Salicylate level     Status: None   Collection Time: 10/28/18  7:15 AM  Result Value Ref Range   Salicylate Lvl <7.0 2.8 - 30.0 mg/dL    Comment: Performed at [facility], [address], [place], [place] [address]  HIV Antibody (routine testing w rflx)     Status: None   Collection Time: 10/28/18 11:09 AM  Result Value Ref Range   HIV Screen 4th Generation wRfx NON REACTIVE NON REACTIVE    Comment: Performed at [facility], [address] [place], [place] [address]  Creatinine, serum     Status: None   Collection Time: 10/28/18 11:09 AM  Result Value Ref Range   Creatinine, Ser 0.76 0.44 - 1.00 mg/dL   GFR calc non Af Amer >60 >60 mL/min   GFR calc Af Amer >60 >60 mL/min    Comment: Performed at [facility], [address], [place], [place] [address]  Magnesium     Status: None   Collection Time: 10/28/18 11:09 AM  Result Value Ref Range   Magnesium 2.1 1.7 - 2.4 mg/dL    Comment: Performed at [facility], [address] [place], [place] [address]  Comprehensive metabolic panel     Status: Abnormal   Collection Time: 10/28/18 11:09 AM  Result Value Ref Range   Sodium 139 135 - 145 mmol/L   Potassium 4.3 3.5 - 5.1 mmol/L    Comment: DELTA CHECK NOTED NO VISIBLE HEMOLYSIS    Chloride 108 98 - 111 mmol/L   CO2 22 22 - 32 mmol/L   Glucose, Bld 81 70 - 99 mg/dL   BUN 7 6 - 20 mg/dL   Creatinine, Ser 6.96 0.44 - 1.00 mg/dL   Calcium 7.9 (L) 8.9 - 10.3 mg/dL   Total Protein 6.0 (L) 6.5 - 8.1 g/dL   Albumin 3.5 3.5 - 5.0 g/dL   AST 18 15 - 41 U/L   ALT 11 0 - 44 U/L   Alkaline Phosphatase 25 (L) 38 - 126 U/L   Total Bilirubin 0.6 0.3 - 1.2 mg/dL   GFR calc non Af Amer >60 >60 mL/min   GFR calc Af Amer >60 >60 mL/min   Anion gap 9 5 -  15    Comment: Performed at [facility], [address], [place], [place] [address]   Comprehensive metabolic panel     Status: Abnormal   Collection Time: 10/29/18  5:45 AM  Result Value Ref Range   Sodium 136 135 - 145 mmol/L   Potassium 4.0 3.5 - 5.1 mmol/L   Chloride 110 98 - 111 mmol/L   CO2 18 (L) 22 - 32 mmol/L   Glucose, Bld 69 (L) 70 - 99 mg/dL   BUN 6 6 - 20 mg/dL   Creatinine, Ser 4.97 0.44 - 1.00 mg/dL   Calcium 7.8 (L) 8.9 - 10.3 mg/dL   Total Protein 5.2 (L) 6.5 - 8.1 g/dL   Albumin 2.9 (L) 3.5 - 5.0 g/dL   AST 15 15 - 41 U/L   ALT 12 0 - 44 U/L   Alkaline Phosphatase 20 (L) 38 - 126 U/L   Total Bilirubin 1.1 0.3 - 1.2 mg/dL   GFR calc non Af Amer >60 >60 mL/min   GFR calc Af Amer >60 >60 mL/min   Anion gap 8 5 - 15    Comment: Performed at [facility], [address], [place], [place] [address]  CBC     Status: Abnormal   Collection Time: 10/29/18  5:45 AM  Result Value Ref Range   WBC 4.4 4.0 - 10.5 K/uL   RBC 3.87 3.87 - 5.11 MIL/uL   Hemoglobin 11.5 (L) 12.0 - 15.0 g/dL   HCT 85.8 (L) 85.0 - 27.7 %   MCV 92.2 80.0 - 100.0 fL   MCH 29.7 26.0 - 34.0 pg   MCHC 32.2 30.0 - 36.0 g/dL   RDW 41.2 87.8 - 67.6 %   Platelets 163 150 - 400 K/uL   nRBC 0.0 0.0 - 0.2 %    Comment: Performed at [facility], [address], [place], [place] [address]  Lactic acid, plasma     Status: None   Collection Time: 10/29/18  1:23 PM  Result Value Ref Range   Lactic Acid, Venous 0.8 0.5 - 1.9 mmol/L    Comment: Performed at [facility], [address] [place], [place] [address]  Basic metabolic panel     Status: Abnormal   Collection Time: 10/29/18  1:53 PM  Result Value Ref Range   Sodium 137 135 - 145 mmol/L   Potassium 4.1 3.5 - 5.1 mmol/L   Chloride 108 98 - 111 mmol/L   CO2 23 22 - 32 mmol/L   Glucose, Bld 150 (H) 70 - 99 mg/dL   BUN 7 6 - 20 mg/dL   Creatinine, Ser 8.36 0.44 - 1.00 mg/dL   Calcium 8.1 (L) 8.9 - 10.3 mg/dL   GFR calc non Af Amer >60 >60 mL/min   GFR calc Af Amer >60 >60  mL/min   Anion gap 6 5 - 15    Comment: Performed at [facility], [address], [place], [place] [address]    Current Facility-Administered Medications  Medication Dose Route Frequency Provider Last Rate Last Dose  . alum & mag hydroxide-simeth (MAALOX/MYLANTA) 200-200-20 MG/5ML suspension 30 mL  30 mL Oral Q6H PRN Palumbo, April, MD      . Chlorhexidine Gluconate Cloth 2 % PADS 6 each  6 each Topical Daily Alwyn Ren, MD   6 each at 10/29/18 1044  . enoxaparin (LOVENOX) injection 40 mg  40 mg Subcutaneous Q24H Alwyn Ren, MD  40 mg at 10/28/18 1336  . lactated ringers infusion   Intravenous Continuous Rolly Salter, MD      . lip balm (CARMEX) ointment   Topical PRN Alwyn Ren, MD   1 application at 10/29/18 [phone]  . Muscle Rub CREA   Topical PRN Rolly Salter, MD      . nicotine (NICODERM CQ - dosed in mg/24 hours) patch 14 mg  14 mg Transdermal Daily Tobey Grim, NP   14 mg at 10/29/18 0932  . ondansetron (ZOFRAN) tablet 4 mg  4 mg Oral Q8H PRN Palumbo, April, MD   4 mg at 10/28/18 2200  . phenol (CHLORASEPTIC) mouth spray 1 spray  1 spray Mouth/Throat PRN Alwyn Ren, MD   1 spray at 10/29/18 0436    Musculoskeletal: Strength & Muscle Tone: decreased Gait & Station: did not witness Patient leans: N/A  Psychiatric Specialty Exam: Physical Exam  Nursing note and vitals reviewed. Constitutional: She is oriented to person, place, and time. She appears well-developed and well-nourished.  HENT:  Head: Normocephalic.  Neck: Normal range of motion.  Respiratory: Effort normal.  Musculoskeletal: Normal range of motion.  Neurological: She is alert and oriented to person, place, and time.  Psychiatric: Her speech is normal and behavior is normal. Her mood appears anxious. Cognition and memory are normal. She expresses impulsivity. She exhibits a depressed mood. She expresses suicidal ideation.    Review of Systems   Psychiatric/Behavioral: Positive for depression, substance abuse and suicidal ideas. The patient is nervous/anxious.   All other systems reviewed and are negative.   Blood pressure 106/70, pulse 75, temperature 98.7 F (37.1 C), temperature source Oral, resp. rate 16, height  (1.676 m), weight 54.4 kg, SpO2 98 %.Body mass index is 19.37 kg/m.  General Appearance: Casual  Eye Contact:  Fair  Speech:  Normal Rate  Volume:  Decreased  Mood:  Anxious and Depressed  Affect:  Congruent  Thought Process:  Coherent and Descriptions of Associations: Intact  Orientation:  Full (Time, Place, and Person)  Thought Content:  Rumination  Suicidal Thoughts:  Yes.  with intent/plan  Homicidal Thoughts:  No  Memory:  Immediate;   Fair Recent;   Fair Remote;   Fair  Judgement:  Poor  Insight:  Fair  Psychomotor Activity:  Decreased  Concentration:  Concentration: Fair and Attention Span: Fair  Recall:  [employer] of Knowledge:  Good  Language:  Good  Akathisia:  No  Handed:  Right  AIMS (if indicated):     Assets:  Housing Leisure Time Physical Health Resilience Social Support Vocational/Educational  ADL's:  Intact  Cognition:  WNL  Sleep:      28 year old female with depression that started a month ago with the death of her fianc.  She started drinking alcohol on Monday after 18 months of sobriety and overdosed Wednesday night.  Tearful on assessment and realizes her coping skills are not working.  Recommend psychiatric admission and notified patient.  She was somewhat reluctant but agreeable.  No homicidal ideations, hallucinations, or withdrawal symptoms.  Treatment Plan Summary: Major depressive disorder, recurrent, severe without psychosis: -Admit to inpatient psychiatric unit  Disposition: Recommend psychiatric Inpatient admission when medically cleared.  Nanine Means, NP 10/29/2018 4:15 PM

## 2018-10-30 LAB — CBC WITH DIFFERENTIAL/PLATELET
Abs Immature Granulocytes: 0.01 10*3/uL (ref 0.00–0.07)
Basophils Absolute: 0 10*3/uL (ref 0.0–0.1)
Basophils Relative: 1 %
Eosinophils Absolute: 0.1 10*3/uL (ref 0.0–0.5)
Eosinophils Relative: 3 %
HCT: 39.6 % (ref 36.0–46.0)
Hemoglobin: 13.1 g/dL (ref 12.0–15.0)
Immature Granulocytes: 0 %
Lymphocytes Relative: 45 %
Lymphs Abs: 1.8 10*3/uL (ref 0.7–4.0)
MCH: 29.6 pg (ref 26.0–34.0)
MCHC: 33.1 g/dL (ref 30.0–36.0)
MCV: 89.6 fL (ref 80.0–100.0)
Monocytes Absolute: 0.3 10*3/uL (ref 0.1–1.0)
Monocytes Relative: 7 %
Neutro Abs: 1.7 10*3/uL (ref 1.7–7.7)
Neutrophils Relative %: 44 %
Platelets: 176 10*3/uL (ref 150–400)
RBC: 4.42 MIL/uL (ref 3.87–5.11)
RDW: 12.9 % (ref 11.5–15.5)
WBC: 3.9 10*3/uL — ABNORMAL LOW (ref 4.0–10.5)
nRBC: 0 % (ref 0.0–0.2)

## 2018-10-30 LAB — COMPREHENSIVE METABOLIC PANEL
ALT: 10 U/L (ref 0–44)
AST: 19 U/L (ref 15–41)
Albumin: 3.6 g/dL (ref 3.5–5.0)
Alkaline Phosphatase: 22 U/L — ABNORMAL LOW (ref 38–126)
Anion gap: 7 (ref 5–15)
BUN: 5 mg/dL — ABNORMAL LOW (ref 6–20)
CO2: 28 mmol/L (ref 22–32)
Calcium: 8.5 mg/dL — ABNORMAL LOW (ref 8.9–10.3)
Chloride: 103 mmol/L (ref 98–111)
Creatinine, Ser: 0.7 mg/dL (ref 0.44–1.00)
GFR calc Af Amer: >60 mL/min (ref >60)
GFR calc non Af Amer: >60 mL/min (ref >60)
Glucose, Bld: 99 mg/dL (ref 70–99)
Potassium: 4.1 mmol/L (ref 3.5–5.1)
Sodium: 138 mmol/L (ref 135–145)
Total Bilirubin: 1.1 mg/dL (ref 0.3–1.2)
Total Protein: 6.2 g/dL — ABNORMAL LOW (ref 6.5–8.1)

## 2018-10-30 LAB — MAGNESIUM: Magnesium: 1.9 mg/dL (ref 1.7–2.4)

## 2018-10-30 MED ORDER — LACTATED RINGERS IV BOLUS
1000.0000 mL | INTRAVENOUS | Status: AC
  Administered 2018-10-30 (×2): 1000 mL via INTRAVENOUS

## 2018-10-30 MED ORDER — LACTATED RINGERS IV SOLN
INTRAVENOUS | Status: DC
  Administered 2018-10-30 – 2018-10-31 (×3): via INTRAVENOUS

## 2018-10-30 NOTE — Progress Notes (Signed)
Patient needs a psychiatric bed to stabilize after medical clearance.  Please call [phone] to see where the process is at [facility] as she was being reviewed there yesterday.  Waylan Boga, PMHNP

## 2018-10-30 NOTE — Progress Notes (Signed)
Patient not medically stable for appropriate bed at [facility].  Patient has orthostatic hypotension and is a high fall risk. No appropriate beds today.  Will reassess in the am when medically cleared.  Spoke with Caryl Pina, RN about POC.

## 2018-10-30 NOTE — Progress Notes (Signed)
[facility] Progress Note  Patient: Fredrick Dray [id number]:[id number]   PCP: Sigmund Hazel, MD DOB: 04/18/1990   DOA: 10/28/2018   DOS: 10/30/2018   Date of Service: the patient was seen and examined on 10/30/2018  Chief Complaint  Patient presents with  . Drug Overdose  . Suicidal   Brief hospital course: Pt. with PMH of depression; presented with complain of drug overdose, was found to have prolonged QTC secondary to drug overdose and dehydration with orthostatic hypotension.  Currently further plan is continue supportive care and transition to behavioral health once bed is available.    Subjective: Fatigue improved with no chest pain abdominal pain no dizziness or lightheadedness now that she is changing her position.  No nausea no vomiting.  Minimal oral intake.  Assessment and Plan: #1 drug overdose with suicidal intention- patient took unknown quantity of Excedrin, trazodone, Cymbalta, Tylenol, and ibuprofen. She now reports that she does not want to die she wants to live.  She was grieving about recent loss of her fianc 3 weeks ago. Poison control was consulted. Maintain K more than 4 mag more than 2. EKG now shows QTC is corrected. No further monitoring from poison control. Psychiatry consulted who recommends behavioral health transfer once bed is available. Discontinue telemetry.  Orthostatic hypotension. From poor p.o. intake. Aggressively hydrated with IV fluid. Orthostasis currently resolved. We will recheck tomorrow morning.   anxiety depression  patient takes BuSpar and trazodone at home.  This will be on hold.  hypokalemia  replaced  Diet: Regular diet  DVT Prophylaxis: Subcutaneous Lovenox  Advance goals of care discussion: Full code  Family Communication: no family was present at bedside, at the time of interview.   Disposition:  Discharge to behavioral health, cannot leave AMA.  Consultants: Psychiatric Procedures: None  Scheduled  Meds: . Chlorhexidine Gluconate Cloth  6 each Topical Daily  . enoxaparin (LOVENOX) injection  40 mg Subcutaneous Q24H  . nicotine  14 mg Transdermal Daily   Continuous Infusions: . lactated ringers 125 mL/hr at 10/30/18 1501   PRN Meds: alum & mag hydroxide-simeth, lip balm, Muscle Rub, ondansetron, phenol, zolpidem Antibiotics: Anti-infectives (From admission, onward)   None       Objective: Physical Exam: Vitals:   10/29/18 2000 10/30/18 0617 10/30/18 1315 10/30/18 1740  BP:  (!) 122/93 120/83 (!) 126/91  Pulse: 66 (!) 59 (!) 56 (!) 55  Resp:  20 15 15   Temp: 98.7 F (37.1 C) 98.1 F (36.7 C) 98.7 F (37.1 C) 98.7 F (37.1 C)  TempSrc: Oral Oral Oral Oral  SpO2: 100% 100% 100% 99%  Weight:      Height:        Intake/Output Summary (Last 24 hours) at 10/30/2018 1750 Last data filed at 10/30/2018 1520 Gross per 24 hour  Intake 1968.61 ml  Output 351 ml  Net 1617.61 ml   Filed Weights   10/28/18 0418  Weight: 54.4 kg   General: alert and oriented to time, place, and person. Appear in moderate distress, affect appropriate Eyes: PERRL, Conjunctiva normal ENT: Oral Mucosa Clear, dry  Neck: no JVD, no Abnormal Mass Or lumps Cardiovascular: S1 and S2 Present, no Murmur, peripheral pulses symmetrical Respiratory: good respiratory effort, Bilateral Air entry equal and Decreased, no signs of accessory muscle use, Clear to Auscultation, no Crackles, no wheezes Abdomen: Bowel Sound present, Soft and no tenderness, no hernia Skin: no rashes  Extremities: no Pedal edema, no calf tenderness Neurologic: without any new focal findings Gait not  checked due to patient safety concerns  Data Reviewed: I have personally reviewed and interpreted daily labs, tele strips, imagings as discussed above. I reviewed all nursing notes, pharmacy notes, vitals, pertinent old records I have discussed plan of care as described above with RN and patient/family.  CBC: Recent Labs  Lab  10/28/18 0403 10/29/18 0545 10/30/18 0631  WBC 3.2* 4.4 3.9*  NEUTROABS 1.1*  --  1.7  HGB 13.9 11.5* 13.1  HCT 41.7 35.7* 39.6  MCV 89.1 92.2 89.6  PLT 210 163 229   Basic Metabolic Panel: Recent Labs  Lab 10/28/18 0403 10/28/18 1109 10/29/18 0545 10/29/18 1353 10/30/18 0631  NA 141 139 136 137 138  K 3.4* 4.3 4.0 4.1 4.1  CL 108 108 110 108 103  CO2 19* 22 18* 23 28  GLUCOSE 112* 81 69* 150* 99  BUN 9 7 6 7  5*  CREATININE 0.87 0.76  0.76 0.76 0.76 0.70  CALCIUM 8.4* 7.9* 7.8* 8.1* 8.5*  MG 2.1 2.1  --   --  1.9    Liver Function Tests: Recent Labs  Lab 10/28/18 0403 10/28/18 1109 10/29/18 0545 10/30/18 0631  AST 14* 18 15 19   ALT 11 11 12 10   ALKPHOS 29* 25* 20* 22*  BILITOT 0.8 0.6 1.1 1.1  PROT 7.1 6.0* 5.2* 6.2*  ALBUMIN 4.0 3.5 2.9* 3.6   No results for input(s): LIPASE, AMYLASE in the last 168 hours. No results for input(s): AMMONIA in the last 168 hours. Coagulation Profile: No results for input(s): INR, PROTIME in the last 168 hours. Cardiac Enzymes: No results for input(s): CKTOTAL, CKMB, CKMBINDEX, TROPONINI in the last 168 hours. BNP (last 3 results) No results for input(s): PROBNP in the last 8760 hours. CBG: Recent Labs  Lab 10/28/18 0412  GLUCAP 99   Studies: No results found.   Time spent: 35 minutes  Author: Berle Mull, MD [facility] 10/30/2018 5:50 PM  To reach On-call, see care teams to locate the attending and reach out to them via [email or web address]. If 7PM-7AM, please contact night-coverage If you still have difficulty reaching the attending provider, please page the [facility] (Director on Call) for [facility] on amion for assistance.

## 2018-10-30 NOTE — TOC Progression Note (Signed)
Transition of Care [facility]) - Progression Note    Patient Details  Name: Grace Grant MRN: [id number] Date of Birth: 29-Aug-1990  Transition of Care [facility]) CM/SW [place], LCSW Phone Number: 10/30/2018, 2:27 PM  Clinical Narrative:   CSW following patient for [facility] placement. At this time patient is not medically stable for discharge . CSW will continue to follow for placement          Expected Discharge Plan and Services                                                 Social Determinants of Health (SDOH) Interventions    Readmission Risk Interventions No flowsheet data found.

## 2018-10-30 NOTE — Progress Notes (Signed)
Pt becoming agitated with how long this process is taking and is asking if there is any way she can go home. RN made aware.

## 2018-10-30 NOTE — Progress Notes (Signed)
Pt ambulated to restroom. 

## 2018-10-30 NOTE — Progress Notes (Signed)
Pt given cell phone by charge RN

## 2018-10-30 NOTE — Progress Notes (Signed)
Nurse walked into room and noted that sitter was sitting at computer. Pt was in the bathroom with the door closed and no supervision. Nurse opened door to bathroom and reeducated Nurse tech about importance of leaving door open and always being able to see the pt.

## 2018-10-30 NOTE — Progress Notes (Signed)
Pt lunch ordered

## 2018-10-30 NOTE — Progress Notes (Signed)
Pt breakfast ordered

## 2018-10-30 NOTE — Progress Notes (Signed)
Pt given dinner tray.

## 2018-10-30 NOTE — Progress Notes (Signed)
Pts mom visiting.  

## 2018-10-30 NOTE — Progress Notes (Signed)
Pt given breakfast tray

## 2018-10-31 ENCOUNTER — Encounter (HOSPITAL_COMMUNITY): Payer: Self-pay

## 2018-10-31 ENCOUNTER — Inpatient Hospital Stay (HOSPITAL_COMMUNITY)
Admission: AD | Admit: 2018-10-31 | Discharge: 2018-11-02 | DRG: 881 | Disposition: A | Discharge status: Home / Self Care | Payer: 59 | Source: Intra-hospital | Attending: Psychiatry | Admitting: Psychiatry

## 2018-10-31 ENCOUNTER — Other Ambulatory Visit: Payer: Self-pay

## 2018-10-31 DIAGNOSIS — G4700 Insomnia, unspecified: Secondary | ICD-10-CM | POA: Diagnosis present

## 2018-10-31 DIAGNOSIS — K219 Gastro-esophageal reflux disease without esophagitis: Secondary | ICD-10-CM | POA: Diagnosis present

## 2018-10-31 DIAGNOSIS — R4589 Other symptoms and signs involving emotional state: Secondary | ICD-10-CM

## 2018-10-31 DIAGNOSIS — F4310 Post-traumatic stress disorder, unspecified: Secondary | ICD-10-CM | POA: Diagnosis present

## 2018-10-31 DIAGNOSIS — T50902A Poisoning by unspecified drugs, medicaments and biological substances, intentional self-harm, initial encounter: Secondary | ICD-10-CM | POA: Diagnosis present

## 2018-10-31 DIAGNOSIS — F332 Major depressive disorder, recurrent severe without psychotic features: Secondary | ICD-10-CM | POA: Diagnosis not present

## 2018-10-31 DIAGNOSIS — F329 Major depressive disorder, single episode, unspecified: Secondary | ICD-10-CM | POA: Diagnosis present

## 2018-10-31 HISTORY — DX: Other symptoms and signs involving emotional state: R45.89

## 2018-10-31 HISTORY — DX: Major depressive disorder, single episode, unspecified: F32.9

## 2018-10-31 MED ORDER — OLANZAPINE 10 MG PO TBDP: 10.0000 mg | ORAL_TABLET | Freq: Three times a day (TID) | ORAL | Status: DC | PRN

## 2018-10-31 MED ORDER — BUSPIRONE HCL 10 MG PO TABS
10.0000 mg | ORAL_TABLET | Freq: Three times a day (TID) | ORAL | Status: DC
  Administered 2018-10-31 – 2018-11-01 (×2): 10 mg via ORAL
  Filled 2018-10-31: qty 2
  Filled 2018-10-31 (×5): qty 1

## 2018-10-31 MED ORDER — PRESCRIPTION MEDICATION: 1.0000 | Freq: Every day | Status: DC

## 2018-10-31 MED ORDER — FOLIC ACID 1 MG PO TABS
1.0000 mg | ORAL_TABLET | Freq: Every day | ORAL | Status: DC
  Administered 2018-10-31: 1 mg via ORAL
  Filled 2018-10-31 (×5): qty 1

## 2018-10-31 MED ORDER — LORAZEPAM 1 MG PO TABS
1.0000 mg | ORAL_TABLET | Freq: Four times a day (QID) | ORAL | Status: DC | PRN
  Filled 2018-10-31: qty 1

## 2018-10-31 MED ORDER — NICOTINE 14 MG/24HR TD PT24
14.0000 mg | MEDICATED_PATCH | Freq: Every day | TRANSDERMAL | Status: DC
  Filled 2018-10-31 (×2): qty 1

## 2018-10-31 MED ORDER — ZIPRASIDONE MESYLATE 20 MG IM SOLR: 20.0000 mg | INTRAMUSCULAR | Status: DC | PRN

## 2018-10-31 MED ORDER — VITAMIN B-1 100 MG PO TABS
100.0000 mg | ORAL_TABLET | Freq: Every day | ORAL | Status: DC
  Administered 2018-10-31: 100 mg via ORAL
  Filled 2018-10-31 (×5): qty 1

## 2018-10-31 MED ORDER — ZOLPIDEM TARTRATE 5 MG PO TABS
5.0000 mg | ORAL_TABLET | Freq: Every evening | ORAL | Status: DC | PRN
  Administered 2018-10-31: 5 mg via ORAL
  Filled 2018-10-31: qty 1

## 2018-10-31 MED ORDER — ALUM & MAG HYDROXIDE-SIMETH 200-200-20 MG/5ML PO SUSP: 30.0000 mL | ORAL | Status: DC | PRN

## 2018-10-31 MED ORDER — HYDROXYZINE HCL 25 MG PO TABS
25.0000 mg | ORAL_TABLET | Freq: Three times a day (TID) | ORAL | Status: DC | PRN
  Administered 2018-10-31 (×2): 25 mg via ORAL
  Filled 2018-10-31 (×2): qty 1

## 2018-10-31 MED ORDER — LORAZEPAM 1 MG PO TABS: 1.0000 mg | ORAL_TABLET | ORAL | Status: DC | PRN

## 2018-10-31 MED ORDER — MAGNESIUM HYDROXIDE 400 MG/5ML PO SUSP: 30.0000 mL | Freq: Every day | ORAL | Status: DC | PRN

## 2018-10-31 MED ORDER — ONDANSETRON HCL 4 MG PO TABS: 4.0000 mg | ORAL_TABLET | Freq: Three times a day (TID) | ORAL | Status: DC | PRN

## 2018-10-31 MED ORDER — PANTOPRAZOLE SODIUM 40 MG PO TBEC
40.0000 mg | DELAYED_RELEASE_TABLET | Freq: Every day | ORAL | Status: DC
  Administered 2018-10-31 – 2018-11-01 (×2): 40 mg via ORAL
  Filled 2018-10-31 (×5): qty 1

## 2018-10-31 MED ORDER — IBUPROFEN 400 MG PO TABS: 400.0000 mg | ORAL_TABLET | Freq: Four times a day (QID) | ORAL | Status: DC | PRN

## 2018-10-31 NOTE — Progress Notes (Signed)
Patient is a 28 year old female admitted voluntarily  from [facility] ED for SA per intentional OD of home medications (Trazadone, Cymbalta and Tylenol along with bottle of wine). Pt states that her suicide attempt was due to her fiance recently passing away.. Pt's  mother was present (with pt's consent) during admission interview. Pt was calm and cooperative, but tearful and mildly irritated  throughout admission process. Pt answered questions logically and coherently-pt  denies SI/HI and A/V hallucinations. VS obtained. Admission paperwork completed and signed- verbal understanding expressed. Belongings searched- pt's mother took cell phone. Skin assessment revealed no abnormalities. Pt oriented to unit. Q 15 min checks initiated for safety- Pt provided with Gatorade and toiletries

## 2018-10-31 NOTE — Progress Notes (Signed)
[facility] Notes:  (Nursing/MHT/Case Management/Adjunct)  Date:  10/31/2018  Time:  2045 Type of Therapy:  wrap up group  Participation Level:  Active  Participation Quality:  Attentive and Sharing  Affect:  Anxious and Depressed  Cognitive:  Alert  Insight:  Improving  Engagement in Group:  Supportive  Modes of Intervention:  Clarification, Education and Support  Summary of Progress/Problems: Pt is pleased to finally be out of [place] and here at [facility] but does not feel she will get much help here. Pt is interested in intensive outpatient therapy especially with grief focus. Pt is grateful to her cat whom she attributes being responsible for her being alive today.   Shellia Cleverly 10/31/2018, 9:57 PM

## 2018-10-31 NOTE — Progress Notes (Addendum)
Patient accepted to [facility] room 302-1. Call report to Mateo Flow at [phone] ASAP for report to [facility]. Spoke with Nurse Lorayne Marek re patient. Patient is medically cleared today.

## 2018-10-31 NOTE — Progress Notes (Signed)
CSW called [place] who is en route to p/u pt now and is asking for an extra sticker, and other than that takes  personal property and an envelope with D/C instructions/Facesheet.  RN/CSW on the floor notified.  Inpatient CSW stated pt's IVC and voluntary form (signed by pt) have been faxed to [facility] and confirmation was received that fax went through.  CSW will continue to follow for D/C needs.  Alphonse Guild. Darolyn Double, LCSW, LCAS, CSI Transitions of Care Clinical Social Worker Care Coordination Department Ph: [phone]

## 2018-10-31 NOTE — Progress Notes (Signed)
Spoke with Mateo Flow at [facility] and gave report on pt.  The involuntary form was signed and faxed to [facility] by Education officer, museum. Transport is at main entrance to pick up patient.

## 2018-10-31 NOTE — Progress Notes (Signed)
[facility] Progress Note  Patient: Grace Grant [id number]:[id number]   PCP: Sigmund Hazel, MD DOB: 08/04/1990   DOA: 10/28/2018   DOS: 10/31/2018   Date of Service: the patient was seen and examined on 10/31/2018  Chief Complaint  Patient presents with  . Drug Overdose  . Suicidal   Brief hospital course: Pt. with PMH of depression; presented with complain of drug overdose, was found to have prolonged QTC secondary to drug overdose and dehydration with orthostatic hypotension.  Currently further plan is await transfer to [facility], medically cleared.   Subjective: Pt denies any fever, chills, headache, runny nose, cough, shortness of breath, chest pain, palpitation, orthopnea, PND, pedal edema, nausea, vomiting, diarrhea, constipation, abdominal pain, acid reflux, active bleeding, black color BM, burning urination, increase urinary frequency, Recent fall, trauma or injury, neck pain, dizziness, vision changes, difficulty swallowing, focal neurological deficit, rash anywhere   Assessment and Plan: drug overdose with suicidal intention patient took unknown quantity of Excedrin, trazodone, Cymbalta, Tylenol, and ibuprofen. She now reports that she does not want to die she wants to live.  She was grieving about recent loss of her fianc 3 weeks ago. Poison control was consulted. Now signed off EKG now shows QTC is corrected. No further monitoring from poison control. Psychiatry consulted who recommends behavioral health transfer once bed is available. Discontinue telemetry.  Orthostatic hypotension. From poor p.o. intake. Aggressively hydrated with IV fluid. Orthostasis currently resolved. Now medically cleared. Asymptomatic.   anxiety depression  patient takes BuSpar and trazodone at home.  This will be on hold.  hypokalemia  replaced  Diet: Regular diet  DVT Prophylaxis: Subcutaneous Lovenox  Advance goals of care discussion: Full code  Family Communication: no family  was present at bedside, at the time of interview.   Disposition:  Discharge to behavioral health, cannot leave AMA. Ready medially.   Consultants: Psychiatric Procedures: None  Scheduled Meds: . Chlorhexidine Gluconate Cloth  6 each Topical Daily  . enoxaparin (LOVENOX) injection  40 mg Subcutaneous Q24H  . nicotine  14 mg Transdermal Daily   Continuous Infusions: . lactated ringers 125 mL/hr at 10/31/18 0649   PRN Meds: alum & mag hydroxide-simeth, lip balm, Muscle Rub, ondansetron, phenol, zolpidem Antibiotics: Anti-infectives (From admission, onward)   None       Objective: Physical Exam: Vitals:   10/30/18 2028 10/31/18 0616 10/31/18 0807 10/31/18 0834  BP: (!) 137/96 125/85 130/89   Pulse: (!) 50 (!) 52 (!) 57   Resp: 18 18 18    Temp: 98.9 F (37.2 C) 97.7 F (36.5 C) 99.4 F (37.4 C)   TempSrc: Oral Oral Oral   SpO2: 100% 100% 100% 98%  Weight:      Height:        Intake/Output Summary (Last 24 hours) at 10/31/2018 1225 Last data filed at 10/31/2018 0947 Gross per 24 hour  Intake 1541.83 ml  Output 1 ml  Net 1540.83 ml   Filed Weights   10/28/18 0418  Weight: 54.4 kg   General: alert and oriented to time, place, and person. Appear in moderate distress, affect appropriate Eyes: PERRL, Conjunctiva normal ENT: Oral Mucosa Clear, dry  Neck: no JVD, no Abnormal Mass Or lumps Cardiovascular: S1 and S2 Present, no Murmur, peripheral pulses symmetrical Respiratory: good respiratory effort, Bilateral Air entry equal and Decreased, no signs of accessory muscle use, Clear to Auscultation, no Crackles, no wheezes Abdomen: Bowel Sound present, Soft and no tenderness, no hernia Skin: no rashes  Extremities: no Pedal edema,  no calf tenderness Neurologic: without any new focal findings Gait not checked due to patient safety concerns  Data Reviewed: I have personally reviewed and interpreted daily labs, tele strips, imagings as discussed above. I reviewed all  nursing notes, pharmacy notes, vitals, pertinent old records I have discussed plan of care as described above with RN and patient/family.  CBC: Recent Labs  Lab 10/28/18 0403 10/29/18 0545 10/30/18 0631  WBC 3.2* 4.4 3.9*  NEUTROABS 1.1*  --  1.7  HGB 13.9 11.5* 13.1  HCT 41.7 35.7* 39.6  MCV 89.1 92.2 89.6  PLT 210 163 659   Basic Metabolic Panel: Recent Labs  Lab 10/28/18 0403 10/28/18 1109 10/29/18 0545 10/29/18 1353 10/30/18 0631  NA 141 139 136 137 138  K 3.4* 4.3 4.0 4.1 4.1  CL 108 108 110 108 103  CO2 19* 22 18* 23 28  GLUCOSE 112* 81 69* 150* 99  BUN 9 7 6 7  5*  CREATININE 0.87 0.76  0.76 0.76 0.76 0.70  CALCIUM 8.4* 7.9* 7.8* 8.1* 8.5*  MG 2.1 2.1  --   --  1.9    Liver Function Tests: Recent Labs  Lab 10/28/18 0403 10/28/18 1109 10/29/18 0545 10/30/18 0631  AST 14* 18 15 19   ALT 11 11 12 10   ALKPHOS 29* 25* 20* 22*  BILITOT 0.8 0.6 1.1 1.1  PROT 7.1 6.0* 5.2* 6.2*  ALBUMIN 4.0 3.5 2.9* 3.6   No results for input(s): LIPASE, AMYLASE in the last 168 hours. No results for input(s): AMMONIA in the last 168 hours. Coagulation Profile: No results for input(s): INR, PROTIME in the last 168 hours. Cardiac Enzymes: No results for input(s): CKTOTAL, CKMB, CKMBINDEX, TROPONINI in the last 168 hours. BNP (last 3 results) No results for input(s): PROBNP in the last 8760 hours. CBG: Recent Labs  Lab 10/28/18 0412  GLUCAP 99   Studies: No results found.   Time spent: 35 minutes  Author: Berle Mull, MD [facility] 10/31/2018 12:25 PM  To reach On-call, see care teams to locate the attending and reach out to them via [email or web address]. If 7PM-7AM, please contact night-coverage If you still have difficulty reaching the attending provider, please page the [facility] (Director on Call) for [facility] on amion for assistance.

## 2018-10-31 NOTE — Discharge Summary (Signed)
[facility] Discharge Summary   Patient: Grace Grant XTK:[id number]   PCP: Kathyrn Lass, MD DOB: [birth year]   Date of admission: 10/28/2018   Date of discharge:  10/31/2018    Discharge Diagnoses:  Principal Problem:   Major depressive disorder, recurrent severe without psychotic features ([place]) Active Problems:   OD (overdose of drug)   Hypokalemia   Anxiety   Depression   Admitted From: home Disposition:  [facility]   Recommendations for Outpatient Follow-up:  1. PCP: follow up in 2 weeks after discharge from [facility] 2. Follow up LABS/TEST:  none  Follow-up Information    Kathyrn Lass, MD. Schedule an appointment as soon as possible for a visit in 1 week(s).   Specialty: Family Medicine Contact information: [place] [address] [phone]          Diet recommendation: Regular diet and Encourage fluids, diet reach In sodium  Activity: The patient is advised to gradually reintroduce usual activities,as tolerated.  Discharge Condition: good  Code Status: Full code   History of present illness: As per the H and P dictated on admission, "Grace Grant is a 28 y.o. female with  past medical history significant for depression took 10 tablets of 150 mg trazodone, 23 tablets of 30 mg Cymbalta, 9 tablets of Excedrin, 12 tablets of Mucinex and 2-3 bottles of wine to kill herself at 9:30 PM last night.this am she called EMS. She has been prescribed trazadone and buspar. Her fiancee passed away 3 weeks ago due to unknown reason. She tells me today she wants to live. She feels anxious jittery and nauseous, denies vomiting.  No diarrhea.  She denies fever chills cough.\ She denies suicidal ideation when I saw her in the ER. Patient has a history of prior suicidal attempt in 2018 per mother.  ED physician Dr. Nicholes Stairs discussed with poison control early this morning they recommended to start the patient on bicarb drip which she received her  potassium was replaced. ED physician Dr. Gilford Raid spoke with poison control prior to calling me for admissions and they recommended to DC bicarb drip.  Poison control recommended to admit and observe until the QTC is back to normal."  Hospital Course:  Summary of her active problems in the hospital is as following. drug overdose with suicidal intention patient took unknown quantity of Excedrin, trazodone, Cymbalta, Tylenol, and ibuprofen. She now reports that she does not want to die she wants to live. She was grieving about recent loss of her fianc 3 weeks ago. Poison control was consulted. Now signed off EKG now shows QTC is corrected. No further monitoring from poison control. Psychiatry consulted who recommends behavioral health transfer once bed is available. Discontinue telemetry.  Orthostatic hypotension. From poor p.o. intake. Aggressively hydrated with IV fluid. Orthostasis currently resolved. Now medically cleared. Asymptomatic.   anxiety depression  patient takes BuSpar and trazodone at home. This will be on hold.  hypokalemia  Replaced  Patient was ambulatory without any assistance. On the day of the discharge the patient's vitals were stable, and no other acute medical condition were reported by patient. the patient was felt safe to be discharge at [facility] with no therapy needed on discharge.  Consultants: psychiatry Procedures: none  DISCHARGE MEDICATION: Allergies as of 10/31/2018   No Known Allergies     Medication List    STOP taking these medications   busPIRone 10 MG tablet Commonly known as: BUSPAR   PRESCRIPTION MEDICATION   traZODone 150 MG tablet Commonly  known as: DESYREL      No Known Allergies Discharge Instructions    Diet - low sodium heart healthy   Complete by: As directed    Increase activity slowly   Complete by: As directed      Discharge Exam: Filed Weights   10/28/18 0418  Weight: 54.4 kg   Vitals:   10/31/18 0807  10/31/18 0834  BP: 130/89   Pulse: (!) 57   Resp: 18   Temp: 99.4 F (37.4 C)   SpO2: 100% 98%   General: Appear in no distress, no Rash; Oral Mucosa Clear, moist. no Abnormal Mass Or lumps Cardiovascular: S1 and S2 Present, no Murmur, Respiratory: normal respiratory effort, Bilateral Air entry present and Clear to Auscultation, no Crackles, no wheezes Abdomen: Bowel Sound present, Soft and no tenderness, no hernia Extremities: no Pedal edema, no calf tenderness Neurology: alert and oriented to time, place, and person affect appropriate.  The results of significant diagnostics from this hospitalization (including imaging, microbiology, ancillary and laboratory) are listed below for reference.    Significant Diagnostic Studies: Dg Chest Portable 1 View  Result Date: 10/28/2018 CLINICAL DATA:  Overdose EXAM: PORTABLE CHEST 1 VIEW COMPARISON:  None. FINDINGS: The heart size and mediastinal contours are within normal limits. Both lungs are clear. The visualized skeletal structures are unremarkable. Artifact from EKG leads IMPRESSION: No active disease. Electronically Signed   By: Marnee SpringJonathon  Watts M.D.   On: 10/28/2018 04:35    Microbiology: Recent Results (from the past 240 hour(s))  SARS Coronavirus 2 by RT PCR (hospital order, performed [facility] lab) Nasopharyngeal Nasopharyngeal Swab     Status: None   Collection Time: 10/28/18  4:03 AM   Specimen: Nasopharyngeal Swab  Result Value Ref Range Status   SARS Coronavirus 2 NEGATIVE NEGATIVE Final    Comment: (NOTE) If result is NEGATIVE SARS-CoV-2 target nucleic acids are NOT DETECTED. The SARS-CoV-2 RNA is generally detectable in upper and lower  respiratory specimens during the acute phase of infection. The lowest  concentration of SARS-CoV-2 viral copies this assay can detect is 250  copies / mL. A negative result does not preclude SARS-CoV-2 infection  and should not be used as the sole basis for treatment or other   patient management decisions.  A negative result may occur with  improper specimen collection / handling, submission of specimen other  than nasopharyngeal swab, presence of viral mutation(s) within the  areas targeted by this assay, and inadequate number of viral copies  (<250 copies / mL). A negative result must be combined with clinical  observations, patient history, and epidemiological information. If result is POSITIVE SARS-CoV-2 target nucleic acids are DETECTED. The SARS-CoV-2 RNA is generally detectable in upper and lower  respiratory specimens dur ing the acute phase of infection.  Positive  results are indicative of active infection with SARS-CoV-2.  Clinical  correlation with patient history and other diagnostic information is  necessary to determine patient infection status.  Positive results do  not rule out bacterial infection or co-infection with other viruses. If result is PRESUMPTIVE POSTIVE SARS-CoV-2 nucleic acids MAY BE PRESENT.   A presumptive positive result was obtained on the submitted specimen  and confirmed on repeat testing.  While 2019 novel coronavirus  (SARS-CoV-2) nucleic acids may be present in the submitted sample  additional confirmatory testing may be necessary for epidemiological  and / or clinical management purposes  to differentiate between  SARS-CoV-2 and other Sarbecovirus currently known to infect  humans.  If clinically indicated additional testing with an alternate test  methodology [phone]) is advised. The SARS-CoV-2 RNA is generally  detectable in upper and lower respiratory sp ecimens during the acute  phase of infection. The expected result is Negative. Fact Sheet for Patients:  BoilerBrush.com.cy Fact Sheet for Healthcare Providers: https://pope.com/ This test is not yet approved or cleared by the [place] FDA and has been authorized for detection and/or diagnosis of SARS-CoV-2 by  FDA under an Emergency Use Authorization (EUA).  This EUA will remain in effect (meaning this test can be used) for the duration of the COVID-19 declaration under Section 564(b)(1) of the Act, 21 U.S.C. section 360bbb-3(b)(1), unless the authorization is terminated or revoked sooner. Performed at [facility], [address], [place], [place] [address]      Labs: CBC: Recent Labs  Lab 10/28/18 0403 10/29/18 0545 10/30/18 0631  WBC 3.2* 4.4 3.9*  NEUTROABS 1.1*  --  1.7  HGB 13.9 11.5* 13.1  HCT 41.7 35.7* 39.6  MCV 89.1 92.2 89.6  PLT 210 163 176   Basic Metabolic Panel: Recent Labs  Lab 10/28/18 0403 10/28/18 1109 10/29/18 0545 10/29/18 1353 10/30/18 0631  NA 141 139 136 137 138  K 3.4* 4.3 4.0 4.1 4.1  CL 108 108 110 108 103  CO2 19* 22 18* 23 28  GLUCOSE 112* 81 69* 150* 99  BUN 9 7 6 7  5*  CREATININE 0.87 0.76  0.76 0.76 0.76 0.70  CALCIUM 8.4* 7.9* 7.8* 8.1* 8.5*  MG 2.1 2.1  --   --  1.9   Liver Function Tests: Recent Labs  Lab 10/28/18 0403 10/28/18 1109 10/29/18 0545 10/30/18 0631  AST 14* 18 15 19   ALT 11 11 12 10   ALKPHOS 29* 25* 20* 22*  BILITOT 0.8 0.6 1.1 1.1  PROT 7.1 6.0* 5.2* 6.2*  ALBUMIN 4.0 3.5 2.9* 3.6   No results for input(s): LIPASE, AMYLASE in the last 168 hours. No results for input(s): AMMONIA in the last 168 hours. Cardiac Enzymes: No results for input(s): CKTOTAL, CKMB, CKMBINDEX, TROPONINI in the last 168 hours. BNP (last 3 results) No results for input(s): BNP in the last 8760 hours. CBG: Recent Labs  Lab 10/28/18 0412  GLUCAP 99    Time spent: 35 minutes  Signed:   [facility]  10/31/2018 1:41 PM

## 2018-10-31 NOTE — Progress Notes (Signed)
CSW spoke to the [facility] at [facility] who states the patient has been offered a bed and has been accepted and that the pt can arrive on 10/31/2018 ASAP.  The pt's accepting doctor is Dr. Mallie Darting.  The room number will be 302-1.  The number for report is [phone].  CSW will update RN and EDP.  Alphonse Guild. Kaleigha Chamberlin, Latanya Presser, LCAS Clinical Social Worker Ph: [phone]

## 2018-11-01 DIAGNOSIS — F332 Major depressive disorder, recurrent severe without psychotic features: Secondary | ICD-10-CM

## 2018-11-01 MED ORDER — ZOLPIDEM TARTRATE 5 MG PO TABS: 10.0000 mg | ORAL_TABLET | Freq: Every day | ORAL | Status: DC

## 2018-11-01 MED ORDER — TRAZODONE HCL 100 MG PO TABS
100.0000 mg | ORAL_TABLET | Freq: Every day | ORAL | Status: DC
  Administered 2018-11-01: 100 mg via ORAL
  Filled 2018-11-01 (×3): qty 1

## 2018-11-01 MED ORDER — PRENATAL MULTIVITAMIN CH
1.0000 | ORAL_TABLET | Freq: Every day | ORAL | Status: DC
  Administered 2018-11-01: 1 via ORAL
  Filled 2018-11-01 (×2): qty 1

## 2018-11-01 MED ORDER — PRAZOSIN HCL 2 MG PO CAPS
4.0000 mg | ORAL_CAPSULE | Freq: Every day | ORAL | Status: DC
  Filled 2018-11-01 (×2): qty 2

## 2018-11-01 MED ORDER — NICOTINE POLACRILEX 2 MG MT GUM
2.0000 mg | CHEWING_GUM | OROMUCOSAL | Status: DC | PRN
  Administered 2018-11-01 – 2018-11-02 (×4): 2 mg via ORAL
  Filled 2018-11-01: qty 1

## 2018-11-01 MED ORDER — BUSPIRONE HCL 15 MG PO TABS
15.0000 mg | ORAL_TABLET | Freq: Three times a day (TID) | ORAL | Status: DC
  Administered 2018-11-01 – 2018-11-02 (×3): 15 mg via ORAL
  Filled 2018-11-01 (×6): qty 1

## 2018-11-01 MED ORDER — DULOXETINE HCL 60 MG PO CPEP
60.0000 mg | ORAL_CAPSULE | Freq: Two times a day (BID) | ORAL | Status: DC
  Administered 2018-11-01 – 2018-11-02 (×2): 60 mg via ORAL
  Filled 2018-11-01 (×5): qty 1

## 2018-11-01 NOTE — [facility] Counselor (Signed)
Adult Comprehensive Assessment  Patient ID: Grace Grant, female   DOB: 28-Apr-1990, 28 y.o.   MRN: [id number]  Information Source: Information source: Patient  Current Stressors:  Patient states their primary concerns and needs for treatment are:: "My overdose (intentional)" Patient states their goals for this hospitilization and ongoing recovery are:: "Getting help with the next step" Educational / Learning stressors: N/A Employment / Job issues: Employed; Denies any current stressors Family Relationships: Denies any current stressors Financial / Lack of resources (include bankruptcy): Denies any current stressors Housing / Lack of housing: Lives alone in [place], [place]; Denies any current stressors; Reports her mother is planning to move in with her at discharge. 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 States she continues to grieve the death of her loved one.  Living/Environment/Situation:  Living Arrangements: Alone Living conditions (as described by patient or guardian): "Good" Who else lives in the home?: Alone How long has patient lived in current situation?: Since June 2020 What is atmosphere in current home: Comfortable  Family History:  Marital status: Single Are you sexually active?: No What is your sexual orientation?: Heterosexual Has your sexual activity been affected by drugs, alcohol, medication, or emotional stress?: No Does patient have children?: No  Childhood History:  By whom was/is the patient raised?: Both parents Description of patient's relationship with caregiver when they were a child: Reports having a "good" relationship with both parents during  her childhood. She reports she and her father were bestfriends when she was a child Patient's description of current relationship with people who raised him/her: Reports having a close and strong relationship with her mother currently, however her father is currently deceased. How were you disciplined when you got in trouble as a child/adolescent?: Spankings, verbally, restrictions Does patient have siblings?: Yes Number of Siblings: 1 Description of patient's current relationship with siblings: Reports having a good relationship with her younger brother currently. Did patient suffer any verbal/emotional/physical/sexual abuse as a child?: No Did patient suffer from severe childhood neglect?: No Has patient ever been sexually abused/assaulted/raped as an adolescent or adult?: No Was the patient ever a victim of a crime or a disaster?: No Witnessed domestic violence?: No Has patient been effected by domestic violence as an adult?: No  Education:  Highest grade of school patient has completed: Some college Currently a Consulting civil engineer?: No Learning disability?: No  Employment/Work Situation:   Employment situation: Employed Where is patient currently employed?: [employer] How long has patient been employed?: Since February 2020 Patient's job has been impacted by current illness: No What is the longest time patient has a held a job?: 6 years Where was the patient employed at that time?: [employer] Did You Receive Any Psychiatric Treatment/Services While in the [employer]?: No Are There Guns or Other Weapons in Your Home?: Yes Types of Guns/Weapons: Father's old hunting rifle (no ammunition) Statistician?: Yes  Financial Resources:   Financial resources: Income from employment, Private insurance Does patient have a representative payee or guardian?: No  Alcohol/Substance Abuse:   What has been your use of drugs/alcohol within the last 12 months?: Reports being  sober for 18 months, prior to drinking a small amount of ETOH during her overdose (suicide attempt)  If attempted suicide, did drugs/alcohol play a role in this?: Yes Alcohol/Substance Abuse Treatment Hx: Denies past history, Attends AA/NA If yes, describe treatment: N/A Has alcohol/substance abuse ever caused legal problems?: No  Social Support System:   Heritage manager System: Good("Family and friends") Describe Community Support System: "Family and friends" Type of faith/religion: Christianity How does patient's faith help to cope with current illness?: Prayer  Leisure/Recreation:   Leisure and Hobbies: "Reading, cooking, playing with my cat, watching YouTube videos"  Strengths/Needs:   What is the patient's perception of their strengths?: "I'm a great friend, hardworking" Patient states they can use these personal strengths during their treatment to contribute to their recovery: Yes Patient states these barriers may affect/interfere with their treatment: Yes; "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 Does patient have access to transportation?: Yes Does patient have financial barriers related to discharge medications?: No Will patient be returning to same living situation after discharge?: Yes  Summary/Recommendations:   Summary and Recommendations (to be completed by the evaluator): Edy is a 28 year old female who  is diagnosed with MDD (major depressive disorder). She presented to the hospital seeking treatment for an intentional overdose in a suicide attempt. During the assessment, Suzann was pleasant and cooperative with completing the assessment. Hensley reports that she continues to grieve the loss of her fiance', who passed away one month ago today. She reports that she felt very overhwhelmed and depressed before attempting to take her life by overdosing. Analysia reports that while in the hospital, she would like to be referred to an outpatient provider who can help manage her medications in addition to provide more intensive therapy services. She expressed interet in the [facility]. Idali can benefit from crisis stabilization, medication management, therapeutic milieu and referral services.  Marylee Floras. 11/01/2018

## 2018-11-01 NOTE — Tx Team (Signed)
Interdisciplinary Treatment and Diagnostic Plan Update  11/01/2018 Time of Session: 10:00am Grace Grant MRN: [id number]  Principal Diagnosis: <principal problem not specified>  Secondary Diagnoses: Active Problems:   MDD (major depressive disorder)   Suicidal behavior   Current Medications:  Current Facility-Administered Medications  Medication Dose Route Frequency Provider Last Rate Last Dose  . alum & mag hydroxide-simeth (MAALOX/MYLANTA) 200-200-20 MG/5ML suspension 30 mL  30 mL Oral Q4H PRN Sharma Covert, MD      . busPIRone (BUSPAR) tablet 15 mg  15 mg Oral TID Johnn Hai, MD      . DULoxetine (CYMBALTA) DR capsule 60 mg  60 mg Oral BID Johnn Hai, MD   60 mg at 11/01/18 1050  . folic acid (FOLVITE) tablet 1 mg  1 mg Oral Daily Sharma Covert, MD   1 mg at 10/31/18 1813  . hydrOXYzine (ATARAX/VISTARIL) tablet 25 mg  25 mg Oral TID PRN Sharma Covert, MD   25 mg at 10/31/18 2240  . ibuprofen (ADVIL) tablet 400 mg  400 mg Oral Q6H PRN Sharma Covert, MD      . LORazepam (ATIVAN) tablet 1 mg  1 mg Oral Q6H PRN Sharma Covert, MD      . OLANZapine zydis (ZYPREXA) disintegrating tablet 10 mg  10 mg Oral Q8H PRN Sharma Covert, MD       And  . LORazepam (ATIVAN) tablet 1 mg  1 mg Oral PRN Sharma Covert, MD       And  . ziprasidone (GEODON) injection 20 mg  20 mg Intramuscular PRN Sharma Covert, MD      . magnesium hydroxide (MILK OF MAGNESIA) suspension 30 mL  30 mL Oral Daily PRN Sharma Covert, MD      . nicotine polacrilex (NICORETTE) gum 2 mg  2 mg Oral PRN Sharma Covert, MD   2 mg at 11/01/18 1049  . ondansetron (ZOFRAN) tablet 4 mg  4 mg Oral Q8H PRN Sharma Covert, MD      . pantoprazole (PROTONIX) EC tablet 40 mg  40 mg Oral Daily Sharma Covert, MD   40 mg at 11/01/18 8527  . prazosin (MINIPRESS) capsule 4 mg  4 mg Oral QHS Johnn Hai, MD      . prenatal multivitamin tablet 1 tablet  1 tablet Oral Q1200 Johnn Hai, MD      . thiamine (VITAMIN B-1) tablet 100 mg  100 mg Oral Daily Sharma Covert, MD   100 mg at 10/31/18 1813  . traZODone (DESYREL) tablet 100 mg  100 mg Oral QHS Johnn Hai, MD       PTA Medications: Medications Prior to Admission  Medication Sig Dispense Refill Last Dose  . DULoxetine (CYMBALTA) 60 MG capsule Take 60 mg by mouth daily.     Marland Kitchen loratadine (CLARITIN) 10 MG tablet Take 10 mg by mouth daily.     . Multiple Vitamins-Minerals (MULTIVITAMIN WITH MINERALS) tablet Take 1 tablet by mouth daily.     Marland Kitchen PRESCRIPTION MEDICATION Take 1 tablet by mouth daily. Birth control     . traZODone (DESYREL) 100 MG tablet Take 100 mg by mouth at bedtime.       Patient Stressors:    Patient Strengths:    Treatment Modalities: Medication Management, Group therapy, Case management,  1 to 1 session with clinician, Psychoeducation, Recreational therapy.   Physician Treatment Plan for Primary Diagnosis: <principal problem not specified> Long Term Goal(s):  Improvement in symptoms so as ready for discharge Improvement in symptoms so as ready for discharge   Short Term Goals: Ability to verbalize feelings will improve Ability to disclose and discuss suicidal ideas Ability to demonstrate self-control will improve Ability to identify and develop effective coping behaviors will improve Ability to maintain clinical measurements within normal limits will improve Compliance with prescribed medications will improve Ability to identify triggers associated with substance abuse/mental health issues will improve Ability to identify changes in lifestyle to reduce recurrence of condition will improve Ability to verbalize feelings will improve Ability to disclose and discuss suicidal ideas Ability to demonstrate self-control will improve Ability to identify and develop effective coping behaviors will improve Ability to maintain clinical measurements within normal limits will improve Compliance  with prescribed medications will improve Ability to identify triggers associated with substance abuse/mental health issues will improve  Medication Management: Evaluate patient's response, side effects, and tolerance of medication regimen.  Therapeutic Interventions: 1 to 1 sessions, Unit Group sessions and Medication administration.  Evaluation of Outcomes: Not Met  Physician Treatment Plan for Secondary Diagnosis: Active Problems:   MDD (major depressive disorder)   Suicidal behavior  Long Term Goal(s): Improvement in symptoms so as ready for discharge Improvement in symptoms so as ready for discharge   Short Term Goals: Ability to verbalize feelings will improve Ability to disclose and discuss suicidal ideas Ability to demonstrate self-control will improve Ability to identify and develop effective coping behaviors will improve Ability to maintain clinical measurements within normal limits will improve Compliance with prescribed medications will improve Ability to identify triggers associated with substance abuse/mental health issues will improve Ability to identify changes in lifestyle to reduce recurrence of condition will improve Ability to verbalize feelings will improve Ability to disclose and discuss suicidal ideas Ability to demonstrate self-control will improve Ability to identify and develop effective coping behaviors will improve Ability to maintain clinical measurements within normal limits will improve Compliance with prescribed medications will improve Ability to identify triggers associated with substance abuse/mental health issues will improve     Medication Management: Evaluate patient's response, side effects, and tolerance of medication regimen.  Therapeutic Interventions: 1 to 1 sessions, Unit Group sessions and Medication administration.  Evaluation of Outcomes: Not Met   RN Treatment Plan for Primary Diagnosis: <principal problem not specified> Long Term  Goal(s): Knowledge of disease and therapeutic regimen to maintain health will improve  Short Term Goals: Ability to participate in decision making will improve, Ability to verbalize feelings will improve, Ability to disclose and discuss suicidal ideas, Ability to identify and develop effective coping behaviors will improve and Compliance with prescribed medications will improve  Medication Management: RN will administer medications as ordered by provider, will assess and evaluate patient's response and provide education to patient for prescribed medication. RN will report any adverse and/or side effects to prescribing provider.  Therapeutic Interventions: 1 on 1 counseling sessions, Psychoeducation, Medication administration, Evaluate responses to treatment, Monitor vital signs and CBGs as ordered, Perform/monitor CIWA, COWS, AIMS and Fall Risk screenings as ordered, Perform wound care treatments as ordered.  Evaluation of Outcomes: Not Met   LCSW Treatment Plan for Primary Diagnosis: <principal problem not specified> Long Term Goal(s): Safe transition to appropriate next level of care at discharge, Engage patient in therapeutic group addressing interpersonal concerns.  Short Term Goals: Engage patient in aftercare planning with referrals and resources  Therapeutic Interventions: Assess for all discharge needs, 1 to 1 time with Social worker, Explore available resources  and support systems, Assess for adequacy in community support network, Educate family and significant other(s) on suicide prevention, Complete Psychosocial Assessment, Interpersonal group therapy.  Evaluation of Outcomes: Not Met   Progress in Treatment: Attending groups: No. New to unit  Participating in groups: No. Taking medication as prescribed: Yes. Toleration medication: Yes. Family/Significant other contact made: No, will contact:  if patient consents to collateral contacts Patient understands diagnosis:  Yes. Discussing patient identified problems/goals with staff: Yes. Medical problems stabilized or resolved: Yes. Denies suicidal/homicidal ideation: Yes. Issues/concerns per patient self-inventory: No. Other:   New problem(s) identified: None   New Short Term/Long Term Goal(s): medication stabilization, elimination of SI thoughts, development of comprehensive mental wellness plan.   Patient Goals:  "To get referrals for outpatient care when I leave here"  Discharge Plan or Barriers: Patient recently admitted. CSW will continue to follow and assess for appropriate referrals and possible discharge planning.   Reason for Continuation of Hospitalization: Anxiety Depression Medication stabilization Suicidal ideation  Estimated Length of Stay: 3-5 days   Attendees: Patient: Grace Grant  11/01/2018 11:33 AM  Physician: Dr. Johnn Hai, MD 11/01/2018 11:33 AM  Nursing: Benjamine Mola.Jenetta Downer, RN 11/01/2018 11:33 AM  RN Care Manager: 11/01/2018 11:33 AM  Social Worker: Radonna Ricker, LCSW 11/01/2018 11:33 AM  Recreational Therapist:  11/01/2018 11:33 AM  Other: Harriett Sine, NP 11/01/2018 11:33 AM  Other: Legrand Como.Chauncey Cruel, RN 11/01/2018 11:33 AM  Other: 11/01/2018 11:33 AM    Scribe for Treatment Team: Marylee Floras, [place] 11/01/2018 11:33 AM

## 2018-11-01 NOTE — Progress Notes (Signed)
Recreation Therapy Notes  Date:  10.26.20 Time: 0935 Location: [address] Dayroom  Group Topic: Stress Management  Goal Area(s) Addresses:  Patient will identify positive stress management techniques. Patient will identify benefits of using stress management post d/c.  Intervention:  Stress Management  Activity :  Meditation.  LRT introduced the stress management technique meditation.  LRT played a meditation that focused on being resilient in the face adversity.  Patients were to listen and follow along to engage in activity.  Education:  Stress Management, Discharge Planning.   Education Outcome: Acknowledges Education  Clinical Observations/Feedback:  Pt did not attend group.    Davonta Stroot, LRT/CTRS         Hayden Kihara A 11/01/2018 11:49 AM 

## 2018-11-01 NOTE — Progress Notes (Signed)
D: Patient denies SI, HI, and AVH.  Patient disclosed that she feels anxious and she rated her anxiety at an 75 out of 10 (with 10 being the worst amount of anxiety she has ever felt). Patient is tearful at times during discussion with this Probation officer.  Patient reports that her appetite is poor.  Patient stated that she had difficulty sleeping yesterday evening and that the medication she was prescribed for sleep was not effective.   A: RN provided emotional support and validated patient's feelings.  RN administered medications as prescribed. Safety checks were maintained q 15 minutes.  RN encouraged patient to let staff know if patient has questions or concerns.  R: Patient is responding well to interacting with her peers in the milieu and participating in activities.  Patient continues to have blunted affect but stated that her anxiety is improving.  Patient stated that she would let staff know if she experiences AVH, SI or HI or is in need of assistance.

## 2018-11-01 NOTE — Progress Notes (Signed)
Adult Psychoeducational Group Note  Date:  11/01/2018 Time:  9:02 PM  Group Topic/Focus:  Wrap-Up Group:   The focus of this group is to help patients review their daily goal of treatment and discuss progress on daily workbooks.  Participation Level:  Active  Participation Quality:  Appropriate  Affect:  Appropriate  Cognitive:  Appropriate  Insight: Appropriate  Engagement in Group:  Engaged  Modes of Intervention:  Discussion  Additional Comments:  Patient attended group and said that her day was a 5.  Her goal for today was to forgive herself. Patient said she had a poor night sleep late night and hopes she have a better one tonight.   Grace Grant 20/94/7096, 9:02 PM

## 2018-11-01 NOTE — [facility] Suicide Risk Assessment (Signed)
[facility] Admission Suicide Risk Assessment   Nursing information obtained from:  Patient Demographic factors:  Caucasian, Adolescent or young adult Current Mental Status:  Suicidal ideation indicated by patient Loss Factors:  Loss of significant relationship Historical Factors:  Impulsivity Risk Reduction Factors:  Living with another person, especially a relative, Positive social support  Total Time spent with patient: 45 minutes Principal Problem: <principal problem not specified> Diagnosis:  Active Problems:   MDD (major depressive disorder)   Suicidal behavior  Subjective Data:   Continued Clinical Symptoms:    The "Alcohol Use Disorders Identification Test", Guidelines for Use in Primary Care, Second Edition.  World Pharmacologist [facility]). Score between 0-7:  no or low risk or alcohol related problems. Score between 8-15:  moderate risk of alcohol related problems. Score between 16-19:  high risk of alcohol related problems. Score 20 or above:  warrants further diagnostic evaluation for alcohol dependence and treatment.   CLINICAL FACTORS:   Depression:   Anhedonia Comorbid alcohol abuse/dependence  Musculoskeletal: Strength & Muscle Tone: within normal limits Gait & Station: normal Patient leans: N/A  Psychiatric Specialty Exam: Physical Exam  Nursing note and vitals reviewed. Constitutional: She appears well-developed and well-nourished.  Cardiovascular: Normal rate and regular rhythm.    Review of Systems  Constitutional: Negative.   Eyes: Negative.   Genitourinary: Negative.   Musculoskeletal: Negative.   Skin: Negative.   Neurological: Negative.   Endo/Heme/Allergies: Negative.     Blood pressure 127/89, pulse 96, temperature 98.7 F (37.1 C), temperature source Oral, resp. rate 16, height 5\' 6"  (1.676 m), weight 54.4 kg, SpO2 99 %.Body mass index is 19.37 kg/m.  General Appearance: Casual  Eye Contact:  Good  Speech:  Clear and Coherent  Volume:   Normal  Mood:  Anxious and Depressed  Affect:  Tearful  Thought Process:  Goal Directed and Descriptions of Associations: Intact  Orientation:  Full (Time, Place, and Person)  Thought Content:  Logical and Rumination  Suicidal Thoughts:  Yes.  without intent/plan  Homicidal Thoughts:  No  Memory:  Immediate;   Fair Recent;   Fair Remote;   Good  Judgement:  Good  Insight:  Good  Psychomotor Activity:  Normal  Concentration:  Concentration: Good and Attention Span: Good  Recall:  Good  Fund of Knowledge:  Good  Language:  Good  Akathisia:  Negative  Handed:  Right  AIMS (if indicated):     Assets:  Communication Skills Desire for Improvement Physical Health Resilience  ADL's:  Intact  Cognition:  WNL  Sleep:  Number of Hours: 4.5     COGNITIVE FEATURES THAT CONTRIBUTE TO RISK:  Polarized thinking    SUICIDE RISK:   Moderate:  Frequent suicidal ideation with limited intensity, and duration, some specificity in terms of plans, no associated intent, good self-control, limited dysphoria/symptomatology, some risk factors present, and identifiable protective factors, including available and accessible social support.  PLAN OF CARE: admit for eval  I certify that inpatient services furnished can reasonably be expected to improve the patient's condition.   Johnn Hai, MD 11/01/2018, 10:45 AM

## 2018-11-01 NOTE — [facility] Group Notes (Signed)
LCSW Group Therapy Note 11/01/2018 3:53 PM  Type of Therapy and Topic: Group Therapy: Overcoming Obstacles  Participation Level: Active  Description of Group:  In this group patients will be encouraged to explore what they see as obstacles to their own wellness and recovery. They will be guided to discuss their thoughts, feelings, and behaviors related to these obstacles. The group will process together ways to cope with barriers, with attention given to specific choices patients can make. Each patient will be challenged to identify changes they are motivated to make in order to overcome their obstacles. This group will be process-oriented, with patients participating in exploration of their own experiences as well as giving and receiving support and challenge from other group members.  Therapeutic Goals: 1. Patient will identify personal and current obstacles as they relate to admission. 2. Patient will identify barriers that currently interfere with their wellness or overcoming obstacles.  3. Patient will identify feelings, thought process and behaviors related to these barriers. 4. Patient will identify two changes they are willing to make to overcome these obstacles:   Summary of Patient Progress  Grace Grant was engaged and participated throughout the group session. Grace Grant reports her main obstacle is "overcoming the loss of my fiance, who died a month ago today". She reports that she is unsure of how she will cope with the loss of her loved one, however she is interested in outpatient grief counseling.    Therapeutic Modalities:  Cognitive Behavioral Therapy Solution Focused Therapy Motivational Interviewing Relapse Prevention Therapy   Theresa Duty Clinical Social Worker

## 2018-11-01 NOTE — Plan of Care (Signed)
Patient has been in the milieu. Anxious but cooperative. Presented to the medication room to report that she will not take prazosin before she discusses the medication with the psychiatrist. Patient was encouraged to discuss with MD in AM. Took Trazodone. Expressing motivation for a long term treatment program.

## 2018-11-01 NOTE — H&P (Signed)
Psychiatric Admission Assessment Adult  Patient Identification: Grace Grant MRN:  [id number] Date of Evaluation:  11/01/2018 Chief Complaint:  mdd Principal Diagnosis: Depression/status post overdose/recent relapse Diagnosis:  Active Problems:   MDD (major depressive disorder)   Suicidal behavior  History of Present Illness:   This is the first psychiatric admission for Ms. Grace Grant, a 28 year old single patient who was brought through the emergency department on 10/22 after an overdose in a suicide attempt.  She had left a note on the bed next to her-and reportedly had taken 10 trazodone tablets (150 mg) 23 Cymbalta tablets (30 mg) 9 Excedrin and 12 Mucinex tablets and had consumed at least 2 bottles of wine possibly more.  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  The trigger was the unexpected death of her fianc, October 14, 2024of this year.  There apparently was recent surgery for hip replacement and he passed suddenly about 3 weeks after surgery.  She apparently was the one who found him deceased on the couch.  Patient was transferred from the medical service to our floor on 10/26-on my interview she lists several depressive symptoms, insomnia, anxiety, anhedonia, dysphoria, crying spells so forth as well as anxiety that to me is consistent with Cymbalta withdrawal.  She has obviously been off of her Cymbalta since her overdose and it has metabolized out of her system.  She is interested in psychiatric follow-up on an individual basis states she does not do pretty well in groups  She is currently tearful, alert and oriented to person place time situation and cooperative, acknowledging depression  indulging thoughts of not wanting to live but understands what it is to contract for safety and can do so here.  She denies any hallucinations.  Again I do not find her to be in alcohol withdrawal but I do find her to be an antidepressant withdrawal.       Associated Signs/Symptoms: Depression Symptoms:  depressed mood, anhedonia, insomnia, psychomotor agitation, feelings of worthlessness/guilt, difficulty concentrating, suicidal thoughts with specific plan, suicidal attempt, anxiety, loss of energy/fatigue, disturbed sleep, (Hypo) Manic Symptoms:  Distractibility, Anxiety Symptoms:  Excessive Worry, Panic Symptoms, Psychotic Symptoms:  n/a PTSD Symptoms: Had a traumatic exposure:  Death of fianc/finding him dead Total Time spent with patient: 45 minutes  Past Psychiatric History: As discussed 1 prior suicide attempt patient states she is in recovery she never received formal detox off of alcohol but beaded herself until her recent relapse  Is the patient at risk to self? Yes.    Has the patient been a risk to self in the past 6 months? No.  Has the patient been a risk to self within the distant past? Yes.    Is the patient a risk to others? No.  Has the patient been a risk to others in the past 6 months? No.  Has the patient been a risk to others within the distant past? No.   Prior Inpatient Therapy:  Negative Prior Outpatient Therapy:  Only psychology/therapy follow-up with psychiatry in the past  Alcohol Screening:   Substance Abuse History in the last 12 months:  Yes.   Consequences of Substance Abuse: Medical Consequences:  Overdose Previous Psychotropic Medications: Yes  Psychological Evaluations:  No  Past Medical History:  Past Medical History:  Diagnosis Date  . Anxiety   . Bradycardia   . Depression     Past Surgical History:  Procedure Laterality Date  . WISDOM TOOTH EXTRACTION     Family History: History reviewed. No pertinent family history. Family  Psychiatric  History: ukn Tobacco Screening:   Social History:  Social History   Substance and Sexual Activity  Alcohol Use Yes   Comment: occ     Social History   Substance and Sexual Activity  Drug Use Never    Additional Social History:                           Allergies:  No Known Allergies Lab Results: No results found for this or any previous visit (from the past 48 hour(s)).  Blood Alcohol level:  Lab Results  Component Value Date   ETH 152 (H) 10/28/2018    Metabolic Disorder Labs:  No results found for: HGBA1C, MPG No results found for: PROLACTIN No results found for: CHOL, TRIG, HDL, CHOLHDL, VLDL, LDLCALC  Current Medications: Current Facility-Administered Medications  Medication Dose Route Frequency Provider Last Rate Last Dose  . alum & mag hydroxide-simeth (MAALOX/MYLANTA) 200-200-20 MG/5ML suspension 30 mL  30 mL Oral Q4H PRN Antonieta Pert, MD      . busPIRone (BUSPAR) tablet 15 mg  15 mg Oral TID Malvin Johns, MD      . DULoxetine (CYMBALTA) DR capsule 60 mg  60 mg Oral BID Malvin Johns, MD      . folic acid (FOLVITE) tablet 1 mg  1 mg Oral Daily Antonieta Pert, MD   1 mg at 10/31/18 1813  . hydrOXYzine (ATARAX/VISTARIL) tablet 25 mg  25 mg Oral TID PRN Antonieta Pert, MD   25 mg at 10/31/18 2240  . ibuprofen (ADVIL) tablet 400 mg  400 mg Oral Q6H PRN Antonieta Pert, MD      . LORazepam (ATIVAN) tablet 1 mg  1 mg Oral Q6H PRN Antonieta Pert, MD      . OLANZapine zydis (ZYPREXA) disintegrating tablet 10 mg  10 mg Oral Q8H PRN Antonieta Pert, MD       And  . LORazepam (ATIVAN) tablet 1 mg  1 mg Oral PRN Antonieta Pert, MD       And  . ziprasidone (GEODON) injection 20 mg  20 mg Intramuscular PRN Antonieta Pert, MD      . magnesium hydroxide (MILK OF MAGNESIA) suspension 30 mL  30 mL Oral Daily PRN Antonieta Pert, MD      . nicotine polacrilex (NICORETTE) gum 2 mg  2 mg Oral PRN Antonieta Pert, MD      .  ondansetron [facility]) tablet 4 mg  4 mg Oral Q8H PRN Antonieta Pert, MD      . pantoprazole (PROTONIX) EC tablet 40 mg  40 mg Oral Daily Antonieta Pert, MD   40 mg at 11/01/18 0539  . prazosin (MINIPRESS) capsule 4 mg  4 mg Oral QHS Malvin Johns, MD      . prenatal multivitamin tablet 1 tablet  1 tablet Oral Q1200 Malvin Johns, MD      . thiamine (VITAMIN B-1) tablet 100 mg  100 mg Oral Daily Antonieta Pert, MD   100 mg at 10/31/18 1813  . zolpidem (AMBIEN) tablet 10 mg  10 mg Oral QHS Jeannine Kitten,  Aaron Edelman, MD       PTA Medications: Medications Prior to Admission  Medication Sig Dispense Refill Last Dose  . DULoxetine (CYMBALTA) 60 MG capsule Take 60 mg by mouth daily.     Marland Kitchen loratadine (CLARITIN) 10 MG tablet Take 10 mg by mouth daily.     . Multiple Vitamins-Minerals (MULTIVITAMIN WITH MINERALS) tablet Take 1 tablet by mouth daily.     Marland Kitchen PRESCRIPTION MEDICATION Take 1 tablet by mouth daily. Birth control     . traZODone (DESYREL) 100 MG tablet Take 100 mg by mouth at bedtime.       Musculoskeletal: Strength & Muscle Tone: within normal limits Gait & Station: normal Patient leans: N/A  Psychiatric Specialty Exam: Physical Exam  Nursing note and vitals reviewed. Constitutional: She appears well-developed and well-nourished.  Cardiovascular: Normal rate and regular rhythm.    Review of Systems  Constitutional: Negative.   Eyes: Negative.   Genitourinary: Negative.   Musculoskeletal: Negative.   Skin: Negative.   Neurological: Negative.   Endo/Heme/Allergies: Negative.     Blood pressure 127/89, pulse 96, temperature 98.7 F (37.1 C), temperature source Oral, resp. rate 16, height 5\' 6"  (1.676 m), weight 54.4 kg, SpO2 99 %.Body mass index is 19.37 kg/m.  General Appearance: Casual  Eye Contact:  Good  Speech:  Clear and Coherent  Volume:  Normal  Mood:  Anxious and Depressed  Affect:  Tearful  Thought Process:  Goal Directed and Descriptions of Associations: Intact   Orientation:  Full (Time, Place, and Person)  Thought Content:  Logical and Rumination  Suicidal Thoughts:  Yes.  without intent/plan  Homicidal Thoughts:  No  Memory:  Immediate;   Fair Recent;   Fair Remote;   Good  Judgement:  Good  Insight:  Good  Psychomotor Activity:  Normal  Concentration:  Concentration: Good and Attention Span: Good  Recall:  Good  Fund of Knowledge:  Good  Language:  Good  Akathisia:  Negative  Handed:  Right  AIMS (if indicated):     Assets:  Communication Skills Desire for Improvement Physical Health Resilience  ADL's:  Intact  Cognition:  WNL  Sleep:  Number of Hours: 4.5    Treatment Plan Summary: Daily contact with patient to assess and evaluate symptoms and progress in treatment and Medication management  Observation Level/Precautions:  15 minute checks  Laboratory:  UA  Psychotherapy: Cognitive-based  Medications: Add antidepressant therapy/protect from Cymbalta withdrawal monitor for alcohol withdrawal  Consultations: None necessary  Discharge Concerns: Longer term sobriety and stability  Estimated LOS: 7 days  Other: 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 Relapse regarding alcohol possibly amphetamines Axis II deferred Axis III Cymbalta withdrawal   Physician Treatment Plan for Primary Diagnosis: For depression and antidepressant therapy for relapse monitor for withdrawal continue current precautions Long Term Goal(s): Improvement in symptoms so as ready for discharge  Short Term Goals: Ability to verbalize feelings will improve, Ability to disclose and discuss suicidal ideas, Ability to demonstrate self-control will improve, Ability to identify and develop effective coping behaviors will improve, Ability to maintain clinical measurements within normal limits will improve, Compliance with prescribed medications will improve and Ability to identify triggers associated with substance abuse/mental health issues will  improve  Physician Treatment Plan for Secondary Diagnosis: Active Problems:   MDD (major depressive disorder)   Suicidal behavior  Long Term Goal(s): Improvement in symptoms so as ready for discharge  Short Term Goals: Ability to identify changes in lifestyle to reduce recurrence  of condition will improve, Ability to verbalize feelings will improve, Ability to disclose and discuss suicidal ideas, Ability to demonstrate self-control will improve, Ability to identify and develop effective coping behaviors will improve, Ability to maintain clinical measurements within normal limits will improve, Compliance with prescribed medications will improve and Ability to identify triggers associated with substance abuse/mental health issues will improve  I certify that inpatient services furnished can reasonably be expected to improve the patient's condition.    Malvin JohnsFARAH,Makela Niehoff, MD 10/26/202010:31 AM

## 2018-11-02 MED ORDER — DULOXETINE HCL 60 MG PO CPEP: 60.0000 mg | ORAL_CAPSULE | Freq: Two times a day (BID) | ORAL | 2 refills | Status: DC

## 2018-11-02 MED ORDER — PRAZOSIN HCL 2 MG PO CAPS: 4.0000 mg | ORAL_CAPSULE | Freq: Every day | ORAL | 2 refills | Status: DC

## 2018-11-02 MED ORDER — ENBRACE HR PO CAPS: 1.0000 | ORAL_CAPSULE | Freq: Every day | ORAL | 2 refills | Status: DC

## 2018-11-02 MED ORDER — PANTOPRAZOLE SODIUM 40 MG PO TBEC: 40.0000 mg | DELAYED_RELEASE_TABLET | Freq: Every day | ORAL | 1 refills | Status: DC

## 2018-11-02 MED ORDER — BUSPIRONE HCL 15 MG PO TABS: 15.0000 mg | ORAL_TABLET | Freq: Three times a day (TID) | ORAL | 2 refills | Status: DC

## 2018-11-02 NOTE — [facility] Suicide Risk Assessment (Signed)
[facility] Discharge Suicide Risk Assessment   Principal Problem: Recent overdose Discharge Diagnoses: Active Problems:   MDD (major depressive disorder)   Suicidal behavior   Total Time spent with patient: 45 minutes Musculoskeletal: Strength & Muscle Tone: within normal limits Gait & Station: normal Patient leans: N/A  Psychiatric Specialty Exam: Physical Exam  ROS  Blood pressure 107/76, pulse 76, temperature 97.7 F (36.5 C), temperature source Oral, resp. rate 16, height 5\' 6"  (1.676 m), weight 54.4 kg, SpO2 99 %.Body mass index is 19.37 kg/m.  General Appearance: Casual  Eye Contact:  Good  Speech:  Clear and Coherent  Volume:  Normal  Mood:  Anxious and Dysphoric  Affect:  Appropriate and Congruent  Thought Process:  Coherent and Descriptions of Associations: Intact  Orientation:  Full (Time, Place, and Person)  Thought Content:  Logical  Suicidal Thoughts:  No  Homicidal Thoughts:  No  Memory:  Immediate;   Good Recent;   Good Remote;   Good  Judgement:  Good  Insight:  Good  Psychomotor Activity:  Normal  Concentration:  Concentration: Good and Attention Span: Good  Recall:  Good  Fund of Knowledge:  Good  Language:  Good  Akathisia:  Negative  Handed:  Right  AIMS (if indicated):     Assets:  Communication Skills Resilience Social Support  ADL's:  Intact  Cognition:  WNL  Sleep:  Number of Hours: 6.5    Mental Status Per Nursing Assessment::   On Admission:  Suicidal ideation indicated by patient  Demographic Factors:  Caucasian  Loss Factors: Loss of significant relationship  Historical Factors: Prior suicide attempts  Risk Reduction Factors:   Sense of responsibility to family and Religious beliefs about death  Continued Clinical Symptoms:  Previous Psychiatric Diagnoses and Treatments  Cognitive Features That Contribute To Risk:  None    Suicide Risk:  Minimal: No identifiable suicidal ideation.  Patients presenting with no risk  factors but with morbid ruminations; may be classified as minimal risk based on the severity of the depressive symptoms    Plan Of Care/Follow-up recommendations:  Activity:  IOP  Kaylib Furness, MD 11/02/2018, 7:34 AM

## 2018-11-02 NOTE — Discharge Summary (Signed)
Physician Discharge Summary Note  Patient:  Grace Grant is an 28 y.o., female MRN:  [id number] DOB:  22-Nov-1990 Patient phone:  [phone] (home)  Patient address:   [address] [place] [id number],  Total Time spent with patient: 45 minutes  Date of Admission:  10/31/2018 Date of Discharge: 11/02/2018  Reason for Admission:     This is the first psychiatric admission for [name], a 28 year old single patient who was brought through the emergency department on 10/22 after an overdose in a suicide attempt.  She had left a note on the bed next to her-and reportedly had taken 10 trazodone tablets (150 mg) 23 Cymbalta tablets (30 mg) 9 Excedrin and 12 Mucinex tablets and had consumed at least 2 bottles of wine possibly more.  Her blood alcohol level was 152, drug screen was positive for amphetamines- When she awoke and realized the overdose was nonlethal she called for help  Collateral history indicated the patient had been in recovery for about a year attending [place] meetings and this was her second suicide attempt, first 1 was in the fall 2018.  The trigger was the unexpected death of her fianc, 10-27-22 of this year.  There apparently was recent surgery for hip replacement and he passed suddenly about 3 weeks after surgery.  She apparently was the one who found him deceased on the couch.  Patient was transferred from the medical service to our floor on 10/26-on my interview she lists several depressive symptoms, insomnia, anxiety, anhedonia, dysphoria, crying spells so forth as well as anxiety that to me is consistent with Cymbalta withdrawal.  She has obviously been off of her Cymbalta since her overdose and it has metabolized out of her system.  She is interested in psychiatric follow-up on an individual basis states she does not do pretty well in groups  She is currently tearful, alert and oriented to person place time situation and cooperative,  acknowledging depression indulging thoughts of not wanting to live but understands what it is to contract for safety and can do so here.  She denies any hallucinations.  Again I do not find her to be in alcohol withdrawal but I do find her to be an antidepressant withdrawal.    Principal Problem: Severe depression/PTSD symptoms/relapse Discharge Diagnoses: Active Problems:   MDD (major depressive disorder)   Suicidal behavior   Past Psychiatric History: As per HPI describes herself as in recovery  Past Medical History:  Past Medical History:  Diagnosis Date  . Anxiety   . Bradycardia   . Depression     Past Surgical History:  Procedure Laterality Date  . WISDOM TOOTH EXTRACTION     Family History: History reviewed. No pertinent family history. Family Psychiatric  History: No new data Social History:  Social History   Substance and Sexual Activity  Alcohol Use Yes   Comment: occ     Social History   Substance and Sexual Activity  Drug Use Never    Social History   Socioeconomic History  . Marital status: Single    Spouse name: Not on file  . Number of children: Not on file  . Years of education: Not on file  . Highest education level: Not on file  Occupational History  . Not on file  Social Needs  . Financial resource strain: Not on file  . Food insecurity    Worry: Not on file    Inability: Not on file  . Transportation needs  Medical: Not on file    Non-medical: Not on file  Tobacco Use  . Smoking status: Never Smoker  . Smokeless tobacco: Never Used  Substance and Sexual Activity  . Alcohol use: Yes    Comment: occ  . Drug use: Never  . Sexual activity: Yes    Birth control/protection: Pill  Lifestyle  . Physical activity    Days per week: Not on file    Minutes per session: Not on file  . Stress: Not on file  Relationships  . Social Musicianconnections    Talks on phone: Not on file    Gets together: Not on file    Attends religious service: Not on  file    Active member of club or organization: Not on file    Attends meetings of clubs or organizations: Not on file    Relationship status: Not on file  Other Topics Concern  . Not on file  Social History Narrative  . Not on file    Hospital Course:    Grace Grant was admitted under routine precautions on was always able to contract for safety and displayed no dangerous behaviors.  She was overall anxious about being hospitalized She was engaged in brief cognitive therapy, she was focused on obtaining IOP/longer term rehab.  She was begun on antidepressant therapy, we added prazosin for nightmares but she did not apparently recall our conversation about this and did not want to take it the night of the 26th at any rate she was compliant with other medications.  By the date of the 26th she was requesting discharge home and insisted she could contract fully for safety, no thoughts of harming self or others, felt her mood was improved and felt the real work would occur at the outpatient setting./No evidence of withdrawal had relapse for only 1 day   Physical Findings: AIMS: Facial and Oral Movements Muscles of Facial Expression: None, normal Lips and Perioral Area: None, normal Jaw: None, normal Tongue: None, normal,Extremity Movements Upper (arms, wrists, hands, fingers): None, normal Lower (legs, knees, ankles, toes): None, normal, Trunk Movements Neck, shoulders, hips: None, normal, Overall Severity Severity of abnormal movements (highest score from questions above): None, normal Incapacitation due to abnormal movements: None, normal Patient's awareness of abnormal movements (rate only patient's report): No Awareness, Dental Status Current problems with teeth and/or dentures?: No Does patient usually wear dentures?: No  CIWA:    COWS:     Musculoskeletal: Strength & Muscle Tone: within normal limits Gait & Station: normal Patient leans: N/A  Psychiatric Specialty Exam: Physical  Exam  ROS  Blood pressure 107/76, pulse 76, temperature 97.7 F (36.5 C), temperature source Oral, resp. rate 16, height 5\' 6"  (1.676 m), weight 54.4 kg, SpO2 99 %.Body mass index is 19.37 kg/m.  General Appearance: Casual  Eye Contact:  Good  Speech:  Clear and Coherent  Volume:  Normal  Mood:  Anxious and Dysphoric  Affect:  Appropriate and Congruent  Thought Process:  Coherent and Descriptions of Associations: Intact  Orientation:  Full (Time, Place, and Person)  Thought Content:  Logical  Suicidal Thoughts:  No  Homicidal Thoughts:  No  Memory:  Immediate;   Good Recent;   Good Remote;   Good  Judgement:  Good  Insight:  Good  Psychomotor Activity:  Normal  Concentration:  Concentration: Good and Attention Span: Good  Recall:  Good  Fund of Knowledge:  Good  Language:  Good  Akathisia:  Negative  Handed:  Right  AIMS (if indicated):     Assets:  Communication Skills Resilience Social Support  ADL's:  Intact  Cognition:  WNL  Sleep:  Number of Hours: 6.5        Has this patient used any form of tobacco in the last 30 days? (Cigarettes, Smokeless Tobacco, Cigars, and/or Pipes) Yes, No  Blood Alcohol level:  Lab Results  Component Value Date   ETH 152 (H) 10/28/2018    Metabolic Disorder Labs:  No results found for: HGBA1C, MPG No results found for: PROLACTIN No results found for: CHOL, TRIG, HDL, CHOLHDL, VLDL, LDLCALC  See Psychiatric Specialty Exam and Suicide Risk Assessment completed by Attending Physician prior to discharge.  Discharge destination:  Home  Is patient on multiple antipsychotic therapies at discharge:  No   Has Patient had three or more failed trials of antipsychotic monotherapy by history:  No  Recommended Plan for Multiple Antipsychotic Therapies: NA   Allergies as of 11/02/2018   No Known Allergies     Medication List    STOP taking these medications   multivitamin with minerals tablet   PRESCRIPTION MEDICATION    traZODone 100 MG tablet Commonly known as: DESYREL     TAKE these medications     Indication  busPIRone 15 MG tablet Commonly known as: BUSPAR Take 1 tablet (15 mg total) by mouth 3 (three) times daily.  Indication: Major Depressive Disorder   DULoxetine 60 MG capsule Commonly known as: CYMBALTA Take 1 capsule (60 mg total) by mouth 2 (two) times daily. What changed: when to take this  Indication: Major Depressive Disorder   EnBrace HR Caps Take 1 capsule by mouth daily. May get from [email or web address]  Indication: Deficiency of Folic Acid   loratadine 10 MG tablet Commonly known as: CLARITIN Take 10 mg by mouth daily.  Indication: Perennial Allergic Rhinitis   pantoprazole 40 MG tablet Commonly known as: PROTONIX Take 1 tablet (40 mg total) by mouth daily.  Indication: Gastroesophageal Reflux Disease   prazosin 2 MG capsule Commonly known as: MINIPRESS Take 2 capsules (4 mg total) by mouth at bedtime.  Indication: Frightening Dreams      May also continue trazodone at home  Follow-up recommendations:  Activity:  full   SignedMalvin Johns, MD 11/02/2018, 7:30 AM

## 2018-11-02 NOTE — Progress Notes (Signed)
  [facility] Adult Case Management Discharge Plan :  Will you be returning to the same living situation after discharge:  Yes,  home with mother At discharge, do you have transportation home?: Yes,  pt's mother Do you have the ability to pay for your medications: Yes,  private insurance  Release of information consent forms completed and in the chart;  Patient's signature needed at discharge.  Patient to Follow up at: Follow-up Information    BEHAVIORAL HEALTH PARTIAL HOSPITALIZATION PROGRAM Follow up on 11/08/2018.   Specialty: Behavioral Health Why: Your CCA is Monday, 11/2 at 10:00a.  The appointment will be virtual. An email will be sent to you with all the information.  Contact information: [place] [phone] [place] [phone]          Next level of care provider has access to [place] and Suicide Prevention discussed: No.; CSW attempted twice with pt's mother      Has patient been referred to the Quitline?: Patient refused referral  Patient has been referred for addiction treatment: Yes  [name], LCSW 11/02/2018, 9:58 AM

## 2018-11-02 NOTE — Progress Notes (Signed)
Pt discharged to lobby. Pt was stable and appreciative at that time. All papers and prescriptions were given and valuables returned. Verbal understanding expressed. Denies SI/HI and A/VH. Pt given opportunity to express concerns and ask questions.  

## 2018-11-02 NOTE — [facility] Suicide Risk Assessment (Signed)
[facility] INPATIENT:  Family/Significant Other Suicide Prevention Education  Suicide Prevention Education:  Contact Attempts: Pt's mother, Grace Grant,  has been identified by the patient as the family member/significant other with whom the patient will be residing, and identified as the person(s) who will aid the patient in the event of a mental health crisis.  With written consent from the patient, two attempts were made to provide suicide prevention education, prior to and/or following the patient's discharge.  We were unsuccessful in providing suicide prevention education.  A suicide education pamphlet was given to the patient to share with family/significant other.  Date and time of first attempt: 11/02/2018 @ 9:40am Date and time of second attempt: 11/02/2018 @ 9:58am     [name] 11/02/2018, 9:57 AM

## 2018-11-08 ENCOUNTER — Telehealth (HOSPITAL_COMMUNITY): Payer: Self-pay | Admitting: Professional

## 2018-11-08 ENCOUNTER — Other Ambulatory Visit: Payer: Self-pay

## 2018-11-08 ENCOUNTER — Other Ambulatory Visit (HOSPITAL_COMMUNITY): Payer: 59 | Attending: Psychiatry | Admitting: Licensed Clinical Social Worker

## 2018-11-08 DIAGNOSIS — Z915 Personal history of self-harm: Secondary | ICD-10-CM | POA: Diagnosis not present

## 2018-11-08 DIAGNOSIS — Z658 Other specified problems related to psychosocial circumstances: Secondary | ICD-10-CM | POA: Diagnosis not present

## 2018-11-08 DIAGNOSIS — F4310 Post-traumatic stress disorder, unspecified: Secondary | ICD-10-CM

## 2018-11-08 DIAGNOSIS — F419 Anxiety disorder, unspecified: Secondary | ICD-10-CM | POA: Insufficient documentation

## 2018-11-08 DIAGNOSIS — F332 Major depressive disorder, recurrent severe without psychotic features: Secondary | ICD-10-CM | POA: Insufficient documentation

## 2018-11-08 DIAGNOSIS — Z7289 Other problems related to lifestyle: Secondary | ICD-10-CM | POA: Diagnosis not present

## 2018-11-08 DIAGNOSIS — G4700 Insomnia, unspecified: Secondary | ICD-10-CM | POA: Insufficient documentation

## 2018-11-08 DIAGNOSIS — Z79899 Other long term (current) drug therapy: Secondary | ICD-10-CM | POA: Insufficient documentation

## 2018-11-08 HISTORY — DX: Post-traumatic stress disorder, unspecified: F43.10

## 2018-11-08 NOTE — Psych (Signed)
Virtual Visit via Video Note  I connected with Grace Grant on 11/08/18 at 10:00 AM EST by a video enabled telemedicine application and verified that I am speaking with the correct person using two identifiers.   I discussed the limitations of evaluation and management by telemedicine and the availability of in person appointments. The patient expressed understanding and agreed to proceed.    I discussed the assessment and treatment plan with the patient. The patient was provided an opportunity to ask questions and all were answered. The patient agreed with the plan and demonstrated an understanding of the instructions.   The patient was advised to call back or seek an in-person evaluation if the symptoms worsen or if the condition fails to improve as anticipated.  I provided 60 minutes of non-face-to-face time during this encounter.   Grace Grant, [facility], LCASA      Comprehensive Clinical Assessment (CCA) Note  11/08/2018 [name] [id number]  Visit Diagnosis:      ICD-10-CM   1. Major depressive disorder, recurrent severe without psychotic features (Grace Grant)  F33.2   2. Posttraumatic stress disorder  F43.10       CCA Part One  Part One has been completed on paper by the patient.  (See scanned document in Chart Review)  CCA Part Two A  Intake/Chief Complaint:  CCA Intake With Chief Complaint CCA Part Two Date: 11/08/18 CCA Part Two Time: 1000 Chief Complaint/Presenting Problem: Pt reports to PHP per inpt. Pt was inpt due to suicide attempt of overdosing and drinking 2+ bottles of wine. Pt denies wine consumption to this cln. 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 Pt reports she does not like to attend AA meetings but has a support network to help her with her sobriety. Pt endorses 1 other hospitalization in 05/19/2016 due to overdose on meds. Pt reports "that wasn't a  suicide attempt. I took 2-3 extra doses of my anxiety meds. Then I threw it up but a friend called 911 and I had to go to the hospital. This time was an attempt." Pt endorses passive SI, denies plan/intent, HI, or AVH. Pt's mother is currently staying with pt for support. Pt reports she cut self with keys for physical pain "a long time ago" but denies current self-harm behaviors. Pt sees Grace Grant for therapy weekly since March 2020. Pt states PCP prescribed anxiety and depression meds. Pt reports hx of MDD, GAD, OCD, and PTSD. Pt continues to deal with grief related to fiance's death and father's unexpected death in May 19, 2012. Pt reports a decrease in ADLs; "I've probably showered 3-4x in the past month. I'm not brushing my teeth or washing my face like I should. My mom cooks and cleans for me." Pt reports work stress due to being out right now and not being with the company long enough for STD. Pt shares this is also leading to financial stress. Patients Currently Reported Symptoms/Problems: Recent inpt stay; suicide attempt by overdose and drinking; grief over fiance's death; decreased ADLs; increased depression and anxiety; decrease in appetite (12lb weight loss in 1 week); tearful; anhedonia; feelings of hopelessness/worthlessness; lack of concentration; memory problems; racing thoughts; increased isolation; decreased sleep- wakes up drenched in sweat, nightmares 3-5x a week; wakes up throughout night, tired during the day, and can't get to sleep due to racing thoughts; irritability; Collateral Involvement: inpt notes Individual's Strengths: motivation for treatment Individual's Preferences: to feel  better Individual's Abilities: can participate in group Type of Services Patient Feels Are Needed: PHP  Mental Health Symptoms Depression:  Depression: Change in energy/activity, Difficulty Concentrating, Fatigue, Hopelessness, Increase/decrease in appetite, Irritability, Sleep (too much or little),  Tearfulness, Weight gain/loss, Worthlessness  Mania:     Anxiety:   Anxiety: Difficulty concentrating, Fatigue, Irritability, Restlessness, Sleep, Tension, Worrying  Psychosis:     Trauma:  Trauma: Detachment from others, Difficulty staying/falling asleep, Irritability/anger, Emotional numbing  Obsessions:     Compulsions:     Inattention:     Hyperactivity/Impulsivity:     Oppositional/Defiant Behaviors:     Borderline Personality:     Other Mood/Personality Symptoms:      Mental Status Exam Appearance and self-care  Stature:  Stature: Average  Weight:  Weight: Average weight  Clothing:  Clothing: Casual  Grooming:  Grooming: Normal  Cosmetic use:  Cosmetic Use: None  Posture/gait:  Posture/Gait: Normal  Motor activity:  Motor Activity: Not Remarkable  Sensorium  Attention:  Attention: Normal  Concentration:  Concentration: Normal  Orientation:  Orientation: X5  Recall/memory:  Recall/Memory: Normal  Affect and Mood  Affect:  Affect: Flat  Mood:  Mood: Depressed  Relating  Eye contact:  Eye Contact: Fleeting  Facial expression:  Facial Expression: Depressed  Attitude toward examiner:  Attitude Toward Examiner: Cooperative  Thought and Language  Speech flow: Speech Flow: Normal  Thought content:  Thought Content: Appropriate to mood and circumstances  Preoccupation:     Hallucinations:     Organization:     Company secretary of Knowledge:  Fund of Knowledge: Average  Intelligence:  Intelligence: Average  Abstraction:  Abstraction: Normal  Judgement:  Judgement: Poor  Reality Testing:  Reality Testing: Adequate  Insight:  Insight: Poor  Decision Making:  Decision Making: Impulsive  Social Functioning  Social Maturity:  Social Maturity: Isolates  Social Judgement:  Social Judgement: Normal  Stress  Stressors:  Stressors: Veterinary surgeon, Arts administrator, Work, Transitions  Coping Ability:  Coping Ability: Horticulturist, commercial Deficits:     Supports:      Family and  Psychosocial History: Family history Marital status: Single Are you sexually active?: No What is your sexual orientation?: Heterosexual Has your sexual activity been affected by drugs, alcohol, medication, or emotional stress?: No Does patient have children?: No  Childhood History:  Childhood History By whom was/is the patient raised?: Both parents Description of patient's relationship with caregiver when they were a child: Pt reports not having a good relationship with mother because "she was too hard on [place]. I was always scared of her." She reports she and her father were bestfriends when she was a child Patient's description of current relationship with people who raised him/her: Reports having a close and strong relationship with her mother currently; father is deceased since 2012-05-14 How were you disciplined when you got in trouble as a child/adolescent?: Spankings, verbally, restrictions Does patient have siblings?: Yes Number of Siblings: 1 Description of patient's current relationship with siblings: Reports having a good relationship with her younger brother currently. Did patient suffer any verbal/emotional/physical/sexual abuse as a child?: No Did patient suffer from severe childhood neglect?: No Has patient ever been sexually abused/assaulted/raped as an adolescent or adult?: No Was the patient ever a victim of a crime or a disaster?: No Witnessed domestic violence?: No Has patient been effected by domestic violence as an adult?: No  CCA Part Two B  Employment/Work Situation: Employment / Work Situation Employment situation: Employed Where is patient  currently employed?: [employer] How long has patient been employed?: Since February 2020 Patient's job has been impacted by current illness: No What is the longest time patient has a held a job?: 6 years Where was the patient employed at that time?: [employer] Did You Receive Any Psychiatric Treatment/Services  While in the [employer]?: No Are There Guns or Other Weapons in Your Home?: Yes Types of Guns/Weapons: Father's old hunting rifle (no ammunition); Pt reports gun is not operational Are These Weapons Safely Secured?: Yes  Education: Education Did Garment/textile technologist From [employer]?: Yes Did You Have An Individualized Education Program (IIEP): No Did You Have Any Difficulty At School?: No  Religion: Religion/Spirituality Are You A Religious Person?: Yes What is Your Religious Affiliation?: Christian How Might This Affect Treatment?: it wont  Leisure/Recreation: Leisure / Recreation Leisure and Hobbies: none right now  Exercise/Diet: Exercise/Diet Do You Exercise?: No Have You Gained or Lost A Significant Amount of Weight in the Past Six Months?: Yes-Lost Number of Pounds Lost?: 12 Do You Follow a Special Diet?: No Do You Have Any Trouble Sleeping?: Yes Explanation of Sleeping Difficulties: Pt reports nightmares 3-5x a week; wakes up in sweat; unable to get to sleep due to racing thoughts;  CCA Part Two C  Alcohol/Drug Use: Alcohol / Drug Use Pain Medications: please see mar Prescriptions: please see mar Over the Counter: please see mar History of alcohol / drug use?: Yes Longest period of sobriety (when/how long): NA Negative Consequences of Use: Legal Substance #1 Name of Substance 1: Alcohol 1 - Age of First Use: 14 1 - Amount (size/oz): 4-6+ drinks 1 - Frequency: daily 1 - Duration: 10 years 1 - Last Use / Amount: 2019; pt denies use during attempt though records indicate blood alcohol level was significant Substance #2 Name of Substance 2: Nicotine 2 - Age of First Use: 14 2 - Amount (size/oz): "I don't know" 2 - Frequency: daily 2 - Duration: 10 years 2 - Last Use / Amount: during assessment    CCA Part Three  ASAM's:  Six Dimensions of Multidimensional Assessment  Dimension 1:  Acute Intoxication and/or Withdrawal Potential:     Dimension 2:  Biomedical  Conditions and Complications:     Dimension 3:  Emotional, Behavioral, or Cognitive Conditions and Complications:     Dimension 4:  Readiness to Change:     Dimension 5:  Relapse, Continued use, or Continued Problem Potential:     Dimension 6:  Recovery/Living Environment:      Substance use Disorder (SUD)    Social Function:  Social Functioning Social Maturity: Isolates Social Judgement: Normal  Stress:  Stress Stressors: Grief/losses, Arts administrator, Work, Transitions Coping Ability: Exhausted Patient Takes Medications The Way The Doctor Instructed?: Yes Priority Risk: Moderate Risk  Risk Assessment- Self-Harm Potential: Risk Assessment For Self-Harm Potential Thoughts of Self-Harm: Vague current thoughts Method: No plan Additional Information for Self-Harm Potential: Previous Attempts, Acts of Self-harm Additional Comments for Self-Harm Potential: Pt just released from inpt due to suicide attempt by overdose; another attempt in fall 2018 by overdose; self-harm acts in past  Risk Assessment -Dangerous to Others Potential: Risk Assessment For Dangerous to Others Potential Method: No Plan  DSM5 Diagnoses: Patient Active Problem List   Diagnosis Date Noted  . Posttraumatic stress disorder 11/08/2018  . MDD (major depressive disorder) 10/31/2018  . Suicidal behavior 10/31/2018  . Major depressive disorder, recurrent severe without psychotic features (HCC) 10/29/2018  . OD (overdose of drug) 10/28/2018  .  Hypokalemia 10/28/2018  . Anxiety 10/28/2018  . Depression 10/28/2018  . Suicide attempt [facility](HCC)     Patient Centered Plan: Patient is on the following Treatment Plan(s):  Depression  Recommendations for Services/Supports/Treatments: Recommendations for Services/Supports/Treatments Recommendations For Services/Supports/Treatments: Partial Hospitalization(Pt reports to PHP per inpt due to suicide attempt. Pt reports lack of coping skills and ability to manage symptoms. Pt is  concerned about sobriety if she does not get help for mental health.)  Treatment Plan Summary:  Pt states, "I want to feel more hopeful about the future."  Referrals to Alternative Service(s): Referred to Alternative Service(s):   Place:   Date:   Time:    Referred to Alternative Service(s):   Place:   Date:   Time:    Referred to Alternative Service(s):   Place:   Date:   Time:    Referred to Alternative Service(s):   Place:   Date:   Time:     Quinn AxeWhitney J Toshia Larkin, [facility], LCASA

## 2018-11-09 ENCOUNTER — Other Ambulatory Visit: Payer: Self-pay

## 2018-11-09 ENCOUNTER — Encounter (HOSPITAL_COMMUNITY): Payer: Self-pay | Admitting: Family

## 2018-11-09 ENCOUNTER — Other Ambulatory Visit (HOSPITAL_COMMUNITY): Payer: 59 | Admitting: Licensed Clinical Social Worker

## 2018-11-09 ENCOUNTER — Other Ambulatory Visit (HOSPITAL_COMMUNITY): Payer: 59 | Admitting: Occupational Therapy

## 2018-11-09 ENCOUNTER — Encounter (HOSPITAL_COMMUNITY): Payer: Self-pay | Admitting: Occupational Therapy

## 2018-11-09 DIAGNOSIS — F332 Major depressive disorder, recurrent severe without psychotic features: Secondary | ICD-10-CM | POA: Diagnosis not present

## 2018-11-09 DIAGNOSIS — F4310 Post-traumatic stress disorder, unspecified: Secondary | ICD-10-CM

## 2018-11-09 DIAGNOSIS — R4589 Other symptoms and signs involving emotional state: Secondary | ICD-10-CM

## 2018-11-09 NOTE — Progress Notes (Signed)
Spoke with patient via [employer], used 2 identifiers to correctly identify patient. States she was at [facility] for less than 48 hours and they recommended the program. She is not sure if she will like the program because she does not like group settings but will give it a try. She is very depressed with flat affect. Her fiance passed away in 02-Oct-2022 unexpected in his sleep after a surgery. Dealing with depression and anxiety when she leaves the house. Has not wanted to leave the house for the past year. Has poor sleep, wakes frequently, night sweats, and nightmares. Would like to get something to help her sleep. Has been on Trazodone for over a year but it has not helped. Denies SI/HI or AV hallucinations. On scale 1-10 as 10 being worst she rates depression at 8 and anxiety at 5. PHQ9=19. No issues or complaints.

## 2018-11-09 NOTE — Progress Notes (Signed)
Virtual Visit via Telephone Note  I connected with Grace Grant on 11/09/18 at  9:00 AM EST by telephone and verified that I am speaking with the correct person using two identifiers.   I discussed the limitations, risks, security and privacy concerns of performing an evaluation and management service by telephone and the availability of in person appointments. I also discussed with the patient that there may be a patient responsible charge related to this service. The patient expressed understanding and agreed to proceed.     I discussed the assessment and treatment plan with the patient. The patient was provided an opportunity to ask questions and all were answered. The patient agreed with the plan and demonstrated an understanding of the instructions.   The patient was advised to call back or seek an in-person evaluation if the symptoms worsen or if the condition fails to improve as anticipated.  I provided 30 minutes of non-face-to-face time during this encounter.   Derrill Center, NP    Behavioral Health Partial Program Assessment Note  Date: 11/09/2018 Name: Grace Grant MRN: [id number]     HPI: Grace Grant  is a 28 y.o. Caucasian female presents after recent inpatient admission for suicide attempt.  Reported overdose on antidepressants.  Grace Grant validated information provided below discharge assessment note.  Patient was very minimally guarded during this assessment.  Responded with sharp " yes" or "no" answers to assessment questions.  Patient was very short and abrupt.  Staff reported concerns with sleep medication.  Will attempt to follow-up with patient. Patient was prescribed 100 mg of trazodone diet inpatient attending psychiatrist however patient reports she was prescribed 250 mg by her by  primary care provider and has not been taking the lower dose as of yet.  Will prescribe hydroxyzine 25 mg p.o. as needed for insomnia and anxiety.  However she  reports patient was enrolled in partial psychiatric program on 11/09/18.   Per discharge assessment note: This is the first psychiatric admission for Grace Grant, a 29 year old single patient who was brought through the emergency department on 10/22 after an overdose in a suicide attempt. She had left a note on the bed next to her-and reportedly had taken 10 trazodone tablets (150 mg) 23 Cymbalta tablets (30 mg) 9 Excedrin and 12 Mucinex tablets and had consumed at least 2 bottles of wine possibly more. Her blood alcohol level was 152, drug screen was positive for amphetamines- When she awoke and realized the overdose was nonlethal she called for help  Collateral history indicated the patient had been in recovery for about a year attending [place] meetings and this was her second suicide attempt, first 1 was in the fall 2018. The trigger was the unexpected death of her fianc, 10/27/22 of this year. There apparently was recent surgery for hip replacement and he passed suddenly about 3 weeks after surgery. She apparently was the one who found him deceased on thecouch.  Patient was transferred from the medical service to our floor on 10/26-on my interview she lists several depressive symptoms, insomnia, anxiety, anhedonia, dysphoria, crying spells so forth as well as anxiety that to me is consistent with Cymbalta withdrawal. She has obviously been off of her Cymbalta since her overdose and it has metabolized out of her system. She is interested in psychiatric follow-up on an individual basis states she does not do pretty well in groups  Primary complaints include: anxiety and depression worse.  Onset of symptoms was gradual with gradually worsening course since  that time. Psychosocial Stressors include the following: family and financial.   I have reviewed the following documentation dated 11/10/2018: past psychiatric history and past medical history  Complaints of Pain: nonear Past  Psychiatric History:  First psychiatric contact  and Past psychiatric hospitalizations   Currently in treatment with   Substance Abuse History: alcohol and methamphetamines Use of Alcohol: minimizes use  Use of Caffeine: denies use Use of over the counter:   Past Surgical History:  Procedure Laterality Date  . WISDOM TOOTH EXTRACTION      Past Medical History:  Diagnosis Date  . Anxiety   . Bradycardia   . Depression    Outpatient Encounter Medications as of 11/09/2018  Medication Sig Note  . busPIRone (BUSPAR) 15 MG tablet Take 1 tablet (15 mg total) by mouth 3 (three) times daily.   . DULoxetine (CYMBALTA) 60 MG capsule Take 1 capsule (60 mg total) by mouth 2 (two) times daily. 11/09/2018: Only taking once daily  . loratadine (CLARITIN) 10 MG tablet Take 10 mg by mouth daily.   . traZODone (DESYREL) 100 MG tablet Take 100 mg by mouth at bedtime.   . pantoprazole (PROTONIX) 40 MG tablet Take 1 tablet (40 mg total) by mouth daily. (Patient not taking: Reported on 11/09/2018)   . prazosin (MINIPRESS) 2 MG capsule Take 2 capsules (4 mg total) by mouth at bedtime. (Patient not taking: Reported on 11/09/2018)   . Prenat Vit-Fe Gly Cys-FA-Omega (ENBRACE HR) CAPS Take 1 capsule by mouth daily. May get from [email or web address] (Patient not taking: Reported on 11/09/2018)    No facility-administered encounter medications on file as of 11/09/2018.    No Known Allergies  Social History   Tobacco Use  . Smoking status: Never Smoker  . Smokeless tobacco: Never Used  Substance Use Topics  . Alcohol use: Yes    Comment: occ   Functioning Relationships: good support system Education: College       Please specify degree:  Other Pertinent History: None No family history on file.   Review of Systems Constitutional: negative  Objective:  There were no vitals filed for this visit.  Physical Exam:   Mental Status Exam: Appearance:  Well groomed Psychomotor::  Within Normal Limits Attention  span and concentration: Normal Behavior: calm and cooperative Speech:  normal volume Mood:  depressed and anxious Affect:  normal Thought Process:  Coherent Thought Content:  WDL Orientation:  person and time/date Cognition:  grossly intact Insight:  Intact Judgment:  Intact Estimate of Intelligence: Average Fund of knowledge: Aware of current events Memory: Recent and remote intact Abnormal movements: None Gait and station  Assessment:  Diagnosis: MDD (major depressive disorder), recurrent severe, without psychosis (HCC) [F33.2] 1. MDD (major depressive disorder), recurrent severe, without psychosis (HCC)     Indications for admission: inpatient care required if not in partial hospital program  Plan: patient enrolled in Partial Hospitalization Program, patient's current medications are to be continued, a comprehensive treatment plan will be developed and side effects of medications have been reviewed with patient  -Continue Cymbalta 60 mg p.o. twice daily -Continue BuSpar 15 mg p.o. 3 times daily -Continue trazodone 150 mg p.o. nightly as needed -Initiated hydroxyzine 25 mg p.o. as needed 3 times daily  Treatment options and alternatives reviewed with patient and patient understands the above plan.  Treatment plan was reviewed and agreed upon by NPT  and patient Grace Grant's need for group services   Oneta Rack, NP

## 2018-11-09 NOTE — Therapy (Signed)
[facility] PART[facility] H[facility]PITAL[facility] PROGRAM [address] [place], [place], [address] Phone: [phone]   Fax:  [phone]  Occupational Therapy Evaluation  Patient Details  Name: Grace Grant MRN: [id number] Date of Birth: 1990/09/30 Referring Provider (OT): Hillery Jacks, NP  Virtual Visit via Video Note  I connected with Grace Grant on 11/09/18 at  8:00 AM EST by a video enabled telemedicine application and verified that I am speaking with the correct person using two identifiers.   I discussed the limitations of evaluation and management by telemedicine and the availability of in person appointments. The patient expressed understanding and agreed to proceed.   I discussed the assessment and treatment plan with the patient. The patient was provided an opportunity to ask questions and all were answered. The patient agreed with the plan and demonstrated an understanding of the instructions.   The patient was advised to call back or seek an in-person evaluation if the symptoms worsen or if the condition fails to improve as anticipated.  I provided 90 minutes of non-face-to-face time during this encounter.   Dalphine Handing, OT    Encounter Date: 11/09/2018  OT End of Session - 11/09/18 1244    Visit Number  1    Number of Visits  12    Date for OT Re-Evaluation  12/07/18    Authorization Type  Aetna    OT Start Time  1030    OT Stop Time  1200    OT Time Calculation (min)  90 min    Activity Tolerance  Patient tolerated treatment well    Behavior During Therapy  WFL for tasks assessed/performed       Past Medical History:  Diagnosis Date  . Anxiety   . Bradycardia   . Depression     Past Surgical History:  Procedure Laterality Date  . WISDOM TOOTH EXTRACTION      There were no vitals filed for this visit.  Subjective Assessment - 11/09/18 0859    Currently in Pain?  No/denies        [facility] OT Assessment - 11/09/18  0001      Assessment   Medical Diagnosis  Major depressive disorder, recurrent severe without psychosis; Post traumatic stress disorder    Referring Provider (OT)  Hillery Jacks, NP    Onset Date/Surgical Date  11/09/18      Precautions   Precautions  None      Restrictions   Weight Bearing Restrictions  No      Balance Screen   Has the patient fallen in the past 6 months  No    Has the patient had a decrease in activity level because of a fear of falling?   No    Is the patient reluctant to leave their home because of a fear of falling?   No         OT assessment: OCAIRS  Diagnosis: MDD severe without psychosis; PTSD  Past medical history/referral information: Recent BH stay for suicide attempt. Triggered by sudden unexpected pass of her fiance 2022/10/04 of this year), whom died from complications of a hip replacement. 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 Has been off for 2 weeks on FMLA  Leisure: not currently engaging  Social support: not currently making pro social contact. States she prefers to be alone,  denies opportunities to hang out with friends  Struggles: grief  OCAIRS Mental Health Interview Summary of Client Scores:  FACILITATES PARTICIPATION IN OCCUPATION  ALLOWS PARTICIPATION IN OCCUPATION INHIBITS PARTICIPATION IN OCCUPATION RESTRICTS PARTICIPATION IN OCCUPATION COMMENTS  ROLES              X    HABITS              X    PERSONAL CAUSATION               X   VALUES             X     INTERESTS               X   SKILLS              X    SHORT TERM GOALS               X   LONG TERM GOALS               X   INTERPETATION OF PAST EXPERIENCES               X   PHYSICAL ENVIRONMENT             X     SOCIAL ENVIRONMENT               X   READINESS FOR CHANGE              X      Need  for Occupational Therapy:  4 Shows positive occupational participation, no need for OT.   3 Need for minimal intervention/consultative participation     X 2 Need for OT intervention indicated to restore/improve participation   1 Need for extensive OT intervention indicated to improve participation.  Referral for follow up services also recommended.    Assessment:  Patient demonstrates behavior that restricts participation in occupation.  Patient will benefit from occupational therapy intervention in order to improve time management, financial management, stress management, job readiness skills, social skills, and health management skills in preparation to return to full time community living and to be a productive community member.    Plan:  Patient will participate in skilled occupational therapy sessions individually or in a group setting to improve coping skills, psychosocial skills, and emotional skills required to return to prior level of function.  Treatment will be 3 times per week for 4 weeks.      OT TREATMENT    S: I have assertive tendencies but I am definitely more passive   O: Education and activities given in reference to increase assertiveness skills within daily life and relationships. Further education given on assertive conversation, situations, body language, and appropriate context for skill. Pt asked to identify one area to increase assertiveness this date. Further education given on the importance of assertiveness mentors and online research to continue skill building in this area increase quality of life.   A: Pt presents with flat affect, engaged and participatory. She shares how she can be more assertive at work and with people she does not know. She endorses passivity with those closer to her. She shares that this is in an effort "keep the peace" and prevent fighting. She shares how she can improve this. She also shares how she was previously aggressive and passive  aggressive, but has since changed. Pt also supported other group member with ideas to best manage  a passive aggressive person.  P: OT group will be x3 per week while pt in PHP.       OT Education - 11/09/18 0905    Education Details  education given on communication skills    Person(s) Educated  Patient    Methods  Explanation;Handout    Comprehension  Verbalized understanding       OT Short Term Goals - 11/09/18 0908      OT SHORT TERM GOAL #1   Title  Pt will be educated on strategies to improve psychosocial skills needed to participate fully in all daily, work, and leisure activities    Time  4    Period  Weeks    Status  New    Target Date  12/07/18      OT SHORT TERM GOAL #2   Title  Pt will apply psychosocial skills and coping mechanisms to daily activities in order to function independently and reintegrate into community    Time  4    Period  Weeks    Status  New      OT SHORT TERM GOAL #3   Title  Pt will choose and/or engage in 1-3 socially engaging leisure activities to improve social participation upon reintegrating into community    Time  4    Period  Weeks    Status  New      OT SHORT TERM GOAL #4   Title  Pt will recall and/or apply 1-3 sleep hygiene strategies to improve function in BADL routine upon reintegrating into community    Time  4    Period  Weeks    Status  New      OT SHORT TERM GOAL #5   Title  Pt will engage in goal setting to improve functional BADL/IADL routine upon reintegrating into community.    Time  4    Period  Weeks    Status  New               Plan - 11/09/18 0906    OT Occupational Profile and History  Detailed Assessment- Review of Records and additional review of physical, cognitive, psychosocial history related to current functional performance    Occupational performance deficits (Please refer to evaluation for details):  ADL's;IADL's;Rest and Sleep;Work;Leisure;Social Participation    Body Structure / Function  / Physical Skills  ADL    Cognitive Skills  Emotional;Energy/Drive    Psychosocial Skills  Coping Strategies;Interpersonal Interaction;Habits    Rehab Potential  Good    Clinical Decision Making  Several treatment options, min-mod task modification necessary    Comorbidities Affecting Occupational Performance:  May have comorbidities impacting occupational performance    Modification or Assistance to Complete Evaluation   Min-Moderate modification of tasks or assist with assess necessary to complete eval    OT Frequency  3x / week    OT Duration  4 weeks    OT Treatment/Interventions  Self-care/ADL training;Coping strategies training;Psychosocial skills training   community reintegration   Consulted and Agree with Plan of Care  Patient       Patient will benefit from skilled therapeutic intervention in order to improve the following deficits and impairments:   Body Structure / Function / Physical Skills: ADL Cognitive Skills: Emotional, Energy/Drive Psychosocial Skills: Coping Strategies, Interpersonal Interaction, Habits   Visit Diagnosis: Major depressive disorder, recurrent severe without psychotic features (HCC)  Posttraumatic stress disorder  Difficulty coping    Problem List Patient Active Problem List  Diagnosis Date Noted  . Posttraumatic stress disorder 11/08/2018  . MDD (major depressive disorder) 10/31/2018  . Suicidal behavior 10/31/2018  . Major depressive disorder, recurrent severe without psychotic features ([place]) 10/29/2018  . OD (overdose of drug) 10/28/2018  . Hypokalemia 10/28/2018  . Anxiety 10/28/2018  . Depression 10/28/2018  . Suicide attempt ([place])    Zenovia Jarred, MSOT, OTR/L Behavioral Health OT/ Acute Relief OT PHP Office: [place] 11/09/2018, 12:44 PM  [facility] PARTIAL HOSPITALIZATION PROGRAM [place], [place], [address] Phone: [phone]   Fax:  [phone]  Name:  Grace Grant MRN: [id number] Date of Birth: 1990/06/29

## 2018-11-09 NOTE — Psych (Signed)
Virtual Visit via Video Note  I connected with Grace Grant on 11/09/18 at  9:00 AM EST by a video enabled telemedicine application and verified that I am speaking with the correct person using two identifiers.   I discussed the limitations of evaluation and management by telemedicine and the availability of in person appointments. The patient expressed understanding and agreed to proceed.  I discussed the assessment and treatment plan with the patient. The patient was provided an opportunity to ask questions and all were answered. The patient agreed with the plan and demonstrated an understanding of the instructions.   The patient was advised to call back or seek an in-person evaluation if the symptoms worsen or if the condition fails to improve as anticipated.  I provided 10 minutes of non-face-to-face time during this encounter.  Patient verbally agrees to treatment plan.  [name], [facility]

## 2018-11-10 ENCOUNTER — Other Ambulatory Visit: Payer: Self-pay

## 2018-11-10 ENCOUNTER — Other Ambulatory Visit (HOSPITAL_COMMUNITY): Payer: 59 | Admitting: Licensed Clinical Social Worker

## 2018-11-10 DIAGNOSIS — F332 Major depressive disorder, recurrent severe without psychotic features: Secondary | ICD-10-CM

## 2018-11-10 MED ORDER — HYDROXYZINE PAMOATE 25 MG PO CAPS: 25.0000 mg | ORAL_CAPSULE | Freq: Three times a day (TID) | ORAL | 0 refills | Status: DC | PRN

## 2018-11-11 ENCOUNTER — Other Ambulatory Visit (HOSPITAL_COMMUNITY): Payer: 59 | Admitting: Licensed Clinical Social Worker

## 2018-11-11 ENCOUNTER — Encounter (HOSPITAL_COMMUNITY): Payer: Self-pay | Admitting: Occupational Therapy

## 2018-11-11 ENCOUNTER — Other Ambulatory Visit (HOSPITAL_COMMUNITY): Payer: 59 | Admitting: Occupational Therapy

## 2018-11-11 ENCOUNTER — Other Ambulatory Visit: Payer: Self-pay

## 2018-11-11 DIAGNOSIS — F332 Major depressive disorder, recurrent severe without psychotic features: Secondary | ICD-10-CM | POA: Diagnosis not present

## 2018-11-11 DIAGNOSIS — R4589 Other symptoms and signs involving emotional state: Secondary | ICD-10-CM

## 2018-11-11 DIAGNOSIS — F4310 Post-traumatic stress disorder, unspecified: Secondary | ICD-10-CM

## 2018-11-11 NOTE — Therapy (Signed)
[facility] PARTIAL HOSPITALIZATION PROGRAM [address] [place], [place], [address] Phone: [phone]   Fax:  [phone]  Occupational Therapy Treatment  Patient Details  Name: Grace Grant MRN: [id number] Date of Birth: 1990/03/24 Referring Provider (OT): Hillery Jacks, NP  Virtual Visit via Video Note  I connected with Grace Grant on 11/11/18 at  8:00 AM EST by a video enabled telemedicine application and verified that I am speaking with the correct person using two identifiers.   I discussed the limitations of evaluation and management by telemedicine and the availability of in person appointments. The patient expressed understanding and agreed to proceed.   I discussed the assessment and treatment plan with the patient. The patient was provided an opportunity to ask questions and all were answered. The patient agreed with the plan and demonstrated an understanding of the instructions.   The patient was advised to call back or seek an in-person evaluation if the symptoms worsen or if the condition fails to improve as anticipated.  I provided 60 minutes of non-face-to-face time during this encounter.   Dalphine Handing, OT    Encounter Date: 11/11/2018  OT End of Session - 11/11/18 1658    Visit Number  2    Number of Visits  12    Date for OT Re-Evaluation  12/07/18    Authorization Type  Aetna    OT Start Time  1200    OT Stop Time  1300    OT Time Calculation (min)  60 min    Activity Tolerance  Patient tolerated treatment well    Behavior During Therapy  WFL for tasks assessed/performed       Past Medical History:  Diagnosis Date  . Anxiety   . Bradycardia   . Depression     Past Surgical History:  Procedure Laterality Date  . WISDOM TOOTH EXTRACTION      There were no vitals filed for this visit.  Subjective Assessment - 11/11/18 1658    Currently in Pain?  No/denies         S: I talk out my problems   O: Stress management initiated this date with information on most common negative coping mechanisms. Pt asked to share current understanding of coping skills and if they currently have put any healthy coping skills into practice. Continued education on healthy coping skills to continue next date.  A: Pt presents with flat affect, shares that she used to act out aggressively due to stress. She says now she "just talks about it". Suspect pt also suppresses stress which leads to unhealthy thoughts/actions. She shares that she has learned some coping skills from her therapist, but has not consistently applied them.  P: OT Group will be x3 per week while pt in PHP. Cont coping skills training next date.              OT Education - 11/11/18 1658    Education Details  education given on coping skills    Person(s) Educated  Patient    Methods  Explanation;Handout    Comprehension  Verbalized understanding       OT Short Term Goals - 11/11/18 1659      OT SHORT TERM GOAL #1   Title  Pt will be educated on strategies to improve psychosocial skills needed to participate fully in all daily, work, and leisure activities    Time  4    Period  Weeks    Status  On-going  Target Date  12/07/18      OT SHORT TERM GOAL #2   Title  Pt will apply psychosocial skills and coping mechanisms to daily activities in order to function independently and reintegrate into community    Time  4    Period  Weeks    Status  On-going      OT SHORT TERM GOAL #3   Title  Pt will choose and/or engage in 1-3 socially engaging leisure activities to improve social participation upon reintegrating into community    Time  4    Period  Weeks    Status  On-going      OT SHORT TERM GOAL #4   Title  Pt will recall and/or apply 1-3 sleep hygiene strategies to improve function in BADL routine upon reintegrating into community    Time  4    Period  Weeks    Status  On-going      OT SHORT TERM GOAL #5   Title   Pt will engage in goal setting to improve functional BADL/IADL routine upon reintegrating into community.    Time  4    Period  Weeks    Status  On-going               Plan - 11/11/18 1659    Occupational performance deficits (Please refer to evaluation for details):  ADL's;IADL's;Rest and Sleep;Work;Leisure;Social Participation    Body Structure / Function / Physical Skills  ADL;IADL    Cognitive Skills  Emotional;Energy/Drive    Psychosocial Skills  Coping Strategies;Interpersonal Interaction;Habits       Patient will benefit from skilled therapeutic intervention in order to improve the following deficits and impairments:   Body Structure / Function / Physical Skills: ADL, IADL Cognitive Skills: Emotional, Energy/Drive Psychosocial Skills: Coping Strategies, Interpersonal Interaction, Habits   Visit Diagnosis: Major depressive disorder, recurrent severe without psychotic features ([place])  Posttraumatic stress disorder  Difficulty coping    Problem List Patient Active Problem List   Diagnosis Date Noted  . Posttraumatic stress disorder 11/08/2018  . MDD (major depressive disorder) 10/31/2018  . Suicidal behavior 10/31/2018  . Major depressive disorder, recurrent severe without psychotic features ([place]) 10/29/2018  . OD (overdose of drug) 10/28/2018  . Hypokalemia 10/28/2018  . Anxiety 10/28/2018  . Depression 10/28/2018  . Suicide attempt ([place])    Zenovia Jarred, MSOT, OTR/L Behavioral Health OT/ Acute Relief OT PHP Office: [place] 11/11/2018, 5:01 PM  [facility] PARTIAL HOSPITALIZATION PROGRAM [place], [place], [address] Phone: [phone]   Fax:  [phone]  Name: Grace Grant MRN: [id number] Date of Birth: [birth year]

## 2018-11-11 NOTE — Progress Notes (Signed)
Pt attended spiritual care group   11/10/2018  11:00-12:00.  Group met via web-ex due to COVID-19 precautions.  Group facilitated by Chaplain Ethan Clayburn, MDiv, [facility]   Group focused on topic of "community."  Members reflected on topic in facilitated dialog, identifying responses to topic and notions they hold of community from their previous experience.  Group members utilized value sort cards to identify top qualities they look for in community.  Engaged in facilitated dialog around their value choices, noting origin of these values, how these are realized in their lives, and strategies for engaging these values.    Spiritual care group drew on Motivational Interviewing, Narrative and Adlerian modalities.  

## 2018-11-12 ENCOUNTER — Other Ambulatory Visit: Payer: Self-pay

## 2018-11-12 ENCOUNTER — Other Ambulatory Visit (HOSPITAL_COMMUNITY): Payer: 59 | Admitting: Licensed Clinical Social Worker

## 2018-11-12 ENCOUNTER — Other Ambulatory Visit (HOSPITAL_COMMUNITY): Payer: 59 | Admitting: Occupational Therapy

## 2018-11-12 DIAGNOSIS — F332 Major depressive disorder, recurrent severe without psychotic features: Secondary | ICD-10-CM

## 2018-11-12 DIAGNOSIS — R4589 Other symptoms and signs involving emotional state: Secondary | ICD-10-CM

## 2018-11-12 DIAGNOSIS — F4310 Post-traumatic stress disorder, unspecified: Secondary | ICD-10-CM

## 2018-11-15 ENCOUNTER — Other Ambulatory Visit (HOSPITAL_COMMUNITY): Payer: 59 | Admitting: Licensed Clinical Social Worker

## 2018-11-15 ENCOUNTER — Encounter (HOSPITAL_COMMUNITY): Payer: Self-pay | Admitting: Occupational Therapy

## 2018-11-15 ENCOUNTER — Other Ambulatory Visit: Payer: Self-pay

## 2018-11-15 DIAGNOSIS — F332 Major depressive disorder, recurrent severe without psychotic features: Secondary | ICD-10-CM

## 2018-11-15 NOTE — Progress Notes (Signed)
Spoke with patient via Webex video call, used 2 identifiers to correctly identify patient. She states she is feeling much better these past few days. Feels like she is ready to go back to work on a modified schedule next week. She is hoping to be discharged on Friday from [facility]. Still having trouble staying sleep. Last night was her worst night of sleep yet with waking at 2, 3:30, 5, and 6:45am. Vistaril does not help at all and she has taken Trazodone without any help. She mentioned taking Ambien while inpatient but states it also did nothing to help her sleep as well. She states her fiance was on Seroquel and that he slept great. She has been clenching her jaw at night and has caused pain. Her plan is to see a therapist once weekly after discharge. On scale 1-10 as 10 being worst she rates depression at 2/3 and anxiety at 1. PHQ9=10. No other issues or complaints.

## 2018-11-15 NOTE — Therapy (Signed)
[place], [place], [address] Phone: [phone]   Fax:  [phone]  Occupational Therapy Treatment  Patient Details  Name: Grace Grant MRN: [id number] Date of Birth: Oct 20, 1990 Referring Provider (OT): Ricky Ala, NP  Virtual Visit via Video Note  I connected with Grace Grant on 11/15/18 at  8:00 AM EST by a video enabled telemedicine application and verified that I am speaking with the correct person using two identifiers.   I discussed the limitations of evaluation and management by telemedicine and the availability of in person appointments. The patient expressed understanding and agreed to proceed.   I discussed the assessment and treatment plan with the patient. The patient was provided an opportunity to ask questions and all were answered. The patient agreed with the plan and demonstrated an understanding of the instructions.   The patient was advised to call back or seek an in-person evaluation if the symptoms worsen or if the condition fails to improve as anticipated.  I provided 60 minutes of non-face-to-face time during this encounter.   Zenovia Jarred, OT    Encounter Date: 11/12/2018  OT End of Session - 11/15/18 1710    Visit Number  3    Number of Visits  12    Date for OT Re-Evaluation  12/07/18    Authorization Type  Aetna    OT Start Time  1200    OT Stop Time  1300    OT Time Calculation (min)  60 min    Activity Tolerance  Patient tolerated treatment well    Behavior During Therapy  WFL for tasks assessed/performed       Past Medical History:  Diagnosis Date  . Anxiety   . Bradycardia   . Depression     Past Surgical History:  Procedure Laterality Date  . WISDOM TOOTH EXTRACTION      There were no vitals filed for this visit.  Subjective Assessment - 11/15/18 1710    Currently in Pain?  No/denies         S: I enjoy writing things  out   O: Continued education given on stress management skills from previous session. Pt to choose healthy coping skill learned this date to implement into BADL/IADL routine.   A: Pt presents with flat affect, engaged and participatory. She shares how writing helps her to express her feelings. She mentions how she can implement a new journaling strategy to help identify stressors and gratitudes.  P: OT group to be x3 per week while pt in PHP            OT Education - 11/15/18 1710    Education Details  continued education given on coping skills    Person(s) Educated  Patient    Methods  Explanation;Handout    Comprehension  Verbalized understanding       OT Short Term Goals - 11/11/18 1659      OT SHORT TERM GOAL #1   Title  Pt will be educated on strategies to improve psychosocial skills needed to participate fully in all daily, work, and leisure activities    Time  4    Period  Weeks    Status  On-going    Target Date  12/07/18      OT SHORT TERM GOAL #2   Title  Pt will apply psychosocial skills and coping mechanisms to daily activities in order to function independently and reintegrate into community  Time  4    Period  Weeks    Status  On-going      OT SHORT TERM GOAL #3   Title  Pt will choose and/or engage in 1-3 socially engaging leisure activities to improve social participation upon reintegrating into community    Time  4    Period  Weeks    Status  On-going      OT SHORT TERM GOAL #4   Title  Pt will recall and/or apply 1-3 sleep hygiene strategies to improve function in BADL routine upon reintegrating into community    Time  4    Period  Weeks    Status  On-going      OT SHORT TERM GOAL #5   Title  Pt will engage in goal setting to improve functional BADL/IADL routine upon reintegrating into community.    Time  4    Period  Weeks    Status  On-going                 Patient will benefit from skilled therapeutic intervention in  order to improve the following deficits and impairments:           Visit Diagnosis: Major depressive disorder, recurrent severe without psychotic features (HCC)  Posttraumatic stress disorder  Difficulty coping    Problem List Patient Active Problem List   Diagnosis Date Noted  . Posttraumatic stress disorder 11/08/2018  . MDD (major depressive disorder) 10/31/2018  . Suicidal behavior 10/31/2018  . Major depressive disorder, recurrent severe without psychotic features (HCC) 10/29/2018  . OD (overdose of drug) 10/28/2018  . Hypokalemia 10/28/2018  . Anxiety 10/28/2018  . Depression 10/28/2018  . Suicide attempt (HCC)    Dalphine Handing, MSOT, OTR/L Behavioral Health OT/ Acute Relief OT PHP Office: [phone]  Dalphine Handing 11/15/2018, 5:11 PM  [facility] PARTIAL HOSPITALIZATION PROGRAM [address] [place], [place], [address] Phone: [phone]   Fax:  [phone]  Name: Grace Grant MRN: [id number] Date of Birth: 1990-02-01

## 2018-11-16 ENCOUNTER — Other Ambulatory Visit (HOSPITAL_COMMUNITY): Payer: 59

## 2018-11-16 ENCOUNTER — Other Ambulatory Visit: Payer: Self-pay

## 2018-11-17 ENCOUNTER — Other Ambulatory Visit (HOSPITAL_COMMUNITY): Payer: 59 | Admitting: Licensed Clinical Social Worker

## 2018-11-17 ENCOUNTER — Other Ambulatory Visit: Payer: Self-pay

## 2018-11-17 DIAGNOSIS — F332 Major depressive disorder, recurrent severe without psychotic features: Secondary | ICD-10-CM

## 2018-11-17 NOTE — Progress Notes (Signed)
Pt attended spiritual care group  11/17/18   11:00-12:00.  Group met via web-ex due to COVID-19 precautions.  Group facilitated by Simone Curia, MDiv, [place]   Group focused on topic of "community."  Members reflected on topic in facilitated dialog, identifying responses to topic and notions they hold of community from their previous experience.  Group members utilized value sort cards to identify top qualities they look for in community.  Engaged in facilitated dialog around their value choices, noting origin of these values, how these are realized in their lives, and strategies for engaging these values.    Spiritual care group drew on Motivational Interviewing, Narrative and Adlerian modalities.  Grace Grant was present throughout group.  Engaged in group discussion - noting that she had a difficult morning, but was feeling better at beginning of group.  Grace Grant related compassion, likemindedness, courtesy, friendship, helpfulness, honesty, love and virtue as elements of community for her.  She reflected with other group members on being community to and for oneself and reflected on how she could be her own friend.  She related that today this looks like having grace for herself for missing group yesterday.

## 2018-11-17 NOTE — Progress Notes (Signed)
Virtual Visit via Telephone Note  I connected with Grace Grant on 11/17/18 at  9:00 AM EST by telephone and verified that I am speaking with the correct person using two identifiers.   I discussed the limitations, risks, security and privacy concerns of performing an evaluation and management service by telephone and the availability of in person appointments. I also discussed with the patient that there may be a patient responsible charge related to this service. The patient expressed understanding and agreed to proceed.  I discussed the assessment and treatment plan with the patient. The patient was provided an opportunity to ask questions and all were answered. The patient agreed with the plan and demonstrated an understanding of the instructions.   The patient was advised to call back or seek an in-person evaluation if the symptoms worsen or if the condition fails to improve as anticipated.  I provided 15 minutes of non-face-to-face time during this encounter.   Derrill Center, NP   [facility] Partial Hospitalization Outpatient Program Discharge Summary  Grace Grant [id number]  Admission date: 11/09/2018 Discharge date: 11/17/2018  Reason for admission: Per admission assessment note: Grace Grant  is a 28 y.o. Caucasian female presents after recent inpatient admission for suicide attempt.  Reported overdose on antidepressants.  Grace Grant validated information provided below discharge assessment note.  Patient was very minimally guarded during this assessment.  Responded with sharp " yes" or "no" answers to assessment questions.  Patient was very short and abrupt.  Staff reported concerns with sleep medication.  Will attempt to follow-up with patient. Patient was prescribed 100 mg of trazodone diet inpatient attending psychiatrist however patient reports she was prescribed 250 mg by her by  primary care provider and has not been taking the lower dose  as of yet.  Will prescribe hydroxyzine 25 mg p.o. as needed for insomnia and anxiety.  However she reports patient was enrolled in partial psychiatric program on 11/09/18.  Progress in Program Toward Treatment Goals: Ongoing, patient attended and participated with daily group session with active and engaged participation.  Patient continues to express concerns with worsening anxiety.  Patient was initiated on hydroxyzine to warrant partial hospitalization programming however patient states taking 1 dose and states this medication was ineffective for her symptoms.  Reports she will follow-up with her primary care provider and has inquired about changing her medication regimen.  She denies suicidal or homicidal ideations.  Denies auditory or visual hallucinations.  As reported by staff patient continues with social anxiety and mild depression.  Patient reports she feels better to return back to work patient was offered intensive outpatient programming patient has declined at this time.  Will make services available at a later date as needed  Progress (rationale): Keep follow-up appointment with therapist.  Patient reports she is followed by her primary care provider.  Declined medication refills at this time.   Take all medications as prescribed. Keep all follow-up appointments as scheduled.  Do not consume alcohol or use illegal drugs while on prescription medications. Report any adverse effects from your medications to your primary care provider promptly.  In the event of recurrent symptoms or worsening symptoms, call 911, a crisis hotline, or go to the nearest emergency department for evaluation.    Derrill Center, NP 11/17/2018

## 2018-11-18 ENCOUNTER — Other Ambulatory Visit: Payer: Self-pay

## 2018-11-18 ENCOUNTER — Other Ambulatory Visit (HOSPITAL_COMMUNITY): Payer: 59 | Admitting: Licensed Clinical Social Worker

## 2018-11-18 ENCOUNTER — Other Ambulatory Visit (HOSPITAL_COMMUNITY): Payer: 59 | Admitting: Occupational Therapy

## 2018-11-18 DIAGNOSIS — F332 Major depressive disorder, recurrent severe without psychotic features: Secondary | ICD-10-CM

## 2018-11-18 DIAGNOSIS — R4589 Other symptoms and signs involving emotional state: Secondary | ICD-10-CM

## 2018-11-18 DIAGNOSIS — F4310 Post-traumatic stress disorder, unspecified: Secondary | ICD-10-CM

## 2018-11-19 ENCOUNTER — Ambulatory Visit (HOSPITAL_COMMUNITY): Payer: 59

## 2018-11-19 ENCOUNTER — Encounter (HOSPITAL_COMMUNITY): Payer: Self-pay

## 2018-11-19 ENCOUNTER — Other Ambulatory Visit (HOSPITAL_COMMUNITY): Payer: 59 | Admitting: Family

## 2018-11-19 NOTE — Therapy (Signed)
[facility] [place], [place], [address] Phone: [phone]   Fax:  [phone]  Occupational Therapy Treatment  Patient Details  Name: Grace Grant MRN: [id number] Date of Birth: 23-Dec-1990 Referring Provider (OT): Ricky Ala, NP  Virtual Visit via Video Note  I connected with Grace Grant on 11/19/18 at  8:00 AM EST by a video enabled telemedicine application and verified that I am speaking with the correct person using two identifiers.   I discussed the limitations of evaluation and management by telemedicine and the availability of in person appointments. The patient expressed understanding and agreed to proceed.   I discussed the assessment and treatment plan with the patient. The patient was provided an opportunity to ask questions and all were answered. The patient agreed with the plan and demonstrated an understanding of the instructions.   The patient was advised to call back or seek an in-person evaluation if the symptoms worsen or if the condition fails to improve as anticipated.  I provided 60 minutes of non-face-to-face time during this encounter.   Zenovia Jarred, OT    Encounter Date: 11/18/2018  OT End of Session - 11/19/18 1329    Visit Number  4    Number of Visits  12    Date for OT Re-Evaluation  12/07/18    Authorization Type  [employer]    OT Start Time  1200    OT Stop Time  1300    OT Time Calculation (min)  60 min    Activity Tolerance  Patient tolerated treatment well    Behavior During Therapy  WFL for tasks assessed/performed       Past Medical History:  Diagnosis Date  . Anxiety   . Bradycardia   . Depression     Past Surgical History:  Procedure Laterality Date  . WISDOM TOOTH EXTRACTION      There were no vitals filed for this visit.  Subjective Assessment - 11/19/18 1329    Currently in Pain?  No/denies           S: This is helpful for  personal growth  O: Education given on self-accountability being in line with personal values and goals to maintain occupational balance in various community settings. Pt given goal identifying worksheet to list immediate, short term, medium term, and long-term goals using a SMART goal framework (specificity, meaningful, adaptive, realistic, and time bound). Goals created as guideline for pt to practice being accountable in various situations. Pt completed work sheet of goals and encouraged to share goals with the group, with emphasis on immediate goal for check in with pt for next session to maintain accountability.   A: Pt presents with brighter affect this date. She is focused on setting goals around personal growth. Pt shared goals and demonstrated appropriate implementation of SMART goals. Anticipate d/c today. Pt states all needs are met and she is eager and feels ready to return to her individual therapist.  P: D/c today.             OT Education - 11/19/18 1329    Education Details  education given on SMART goals    Person(s) Educated  Patient    Methods  Explanation;Handout    Comprehension  Verbalized understanding       OT Short Term Goals - 11/19/18 1330      OT SHORT TERM GOAL #1   Title  Pt will be educated on strategies to improve psychosocial skills  needed to participate fully in all daily, work, and leisure activities    Time  4    Period  Weeks    Status  Achieved    Target Date  12/07/18      OT SHORT TERM GOAL #2   Title  Pt will apply psychosocial skills and coping mechanisms to daily activities in order to function independently and reintegrate into community    Time  4    Period  Weeks    Status  Achieved      OT SHORT TERM GOAL #3   Title  Pt will choose and/or engage in 1-3 socially engaging leisure activities to improve social participation upon reintegrating into community    Time  4    Period  Weeks    Status  Achieved      OT SHORT TERM  GOAL #4   Title  Pt will recall and/or apply 1-3 sleep hygiene strategies to improve function in BADL routine upon reintegrating into community    Time  4    Period  Weeks    Status  Achieved      OT SHORT TERM GOAL #5   Title  Pt will engage in goal setting to improve functional BADL/IADL routine upon reintegrating into community.    Time  4    Period  Weeks    Status  Achieved               Plan - 11/19/18 1330    Occupational performance deficits (Please refer to evaluation for details):  ADL's;IADL's;Rest and Sleep;Work;Leisure;Social Participation    Body Structure / Function / Physical Skills  ADL;IADL    Cognitive Skills  Emotional;Energy/Drive    Psychosocial Skills  Coping Strategies;Interpersonal Interaction;Habits       Patient will benefit from skilled therapeutic intervention in order to improve the following deficits and impairments:   Body Structure / Function / Physical Skills: ADL, IADL Cognitive Skills: Emotional, Energy/Drive Psychosocial Skills: Coping Strategies, Interpersonal Interaction, Habits   Visit Diagnosis: Major depressive disorder, recurrent severe without psychotic features ([place])  Posttraumatic stress disorder  Difficulty coping    Problem List Patient Active Problem List   Diagnosis Date Noted  . Posttraumatic stress disorder 11/08/2018  . MDD (major depressive disorder) 10/31/2018  . Suicidal behavior 10/31/2018  . Major depressive disorder, recurrent severe without psychotic features ([place]) 10/29/2018  . OD (overdose of drug) 10/28/2018  . Hypokalemia 10/28/2018  . Anxiety 10/28/2018  . Depression 10/28/2018  . Suicide attempt ([place])     OCCUPATIONAL THERAPY DISCHARGE SUMMARY  Visits from Start of Care: 4  Current functional level related to goals / functional outcomes: Returning to outpatient therapist   Remaining deficits: Continuing to implement coping skills; coping with holidays after loss of fiance   Education  / Equipment: Education given on psychosocial skills with focus on BADL/IADL skills for success community reintegration  Plan: Patient agrees to discharge.  Patient goals were met. Patient is being discharged due to meeting the stated rehab goals.  ?????        Zenovia Jarred, MSOT, OTR/L Behavioral Health OT/ Acute Relief OT PHP Office: [place] 11/19/2018, 1:31 PM  [facility] HOSPITALIZATION PROGRAM [facility] [place], [place], [address] Phone: [phone]   Fax:  [phone]  Name: Grace Grant MRN: [id number] Date of Birth: 02-Jun-1990

## 2018-11-20 NOTE — Psych (Signed)
Virtual Visit via Video Note  I connected with Grace Grant on 11/09/18 at  9:00 AM EST by a video enabled telemedicine application and verified that I am speaking with the correct person using two identifiers.   I discussed the limitations of evaluation and management by telemedicine and the availability of in person appointments. The patient expressed understanding and agreed to proceed.  I discussed the assessment and treatment plan with the patient. The patient was provided an opportunity to ask questions and all were answered. The patient agreed with the plan and demonstrated an understanding of the instructions.   The patient was advised to call back or seek an in-person evaluation if the symptoms worsen or if the condition fails to improve as anticipated.  Pt was provided 240 minutes of non-face-to-face time during this encounter.   Grace Guiles, LCSW    [facility] BH PHP THERAPIST PROGRESS NOTE  Grace Grant [id number]  Session Time: 9:00 - 10:00  Participation Level: Active  Behavioral Response: CasualAlertDepressed  Type of Therapy: Group Therapy  Treatment Goals addressed: Coping  Interventions: CBT, DBT, Supportive and Reframing  Summary: Clinician led check-in regarding current stressors and situation, and review of patient completed daily inventory. Clinician utilized active listening and empathetic response and validated patient emotions. Clinician facilitated processing group on pertinent issues.   Therapist Response:Grace Grant is a 28 y.o. female who presents with depression symptoms.  Patient arrived within time allowed and reports that she is feeling "sad and tired." Patient [facility]ood at [facility] a scale of 1-10 with 10 being great. Pt reports she is sleeping about 8 hours a night but is not feeling rested. Pt states her afternoon was "fine" and that she went to an appointment, painted her nails, and laid in bed. Pt reports she has had  the opportunity to hang out with friends the two days prior but chose not to because she doesn't want to be around people. Pt reports cravings for alcohol and that she is managing by not having it around. Pt discusses struggling with the grief of her fiance dying within the month. Pt is tearful and expresses desire to "get out of my body" and is hopeful that group will help her with that feeling. Pt able to process. Patient engaged in discussion.       Session Time: 10:00 -11:00  Participation Level: Active  Behavioral Response: CasualAlertDepressed  Type of Therapy: Group Therapy, psychoeducation, psychotherapy  Treatment Goals addressed: Coping  Interventions: CBT, DBT, Solution Focused, Supportive and Reframing  Summary: Cln continued topic of distress tolerance skills and introduced the Self Soothe skill. Group reviewed ways they could apply the self soothe skill.   Therapist Response: Pt reports understanding of the self soothe skill. Pt identifies her stuffed animal, cat, and candles as ways she can practice self soothe.        Session Time: 11:00- 12:00  Participation Level:Active  Behavioral Response:CasualAlertDepressed  Type of Therapy: Group Therapy, OT  Treatment Goals addressed: Coping  Interventions:Psychosocial skills training, Supportive,   Summary:Occupational Therapy group  Therapist Response:Patient engaged in group. See OT note.        Session Time: 12:00 -1:00  Participation Level: Active  Behavioral Response: CasualAlertDepressed  Type of Therapy: Group Therapy, Psychoeducation; Psychotherapy  Treatment Goals addressed: Coping  Interventions: CBT; Solution focused; Supportive; Reframing  Summary: 12:00 - 12:50 Cln continued topic of cognitive distortions and introduced the "Catch" portion of the catch-challenge-change behavior modification model. Cln utilized CBT framework and discussed "checking the  facts" and bringing in logic to counter the irrational thoughts. Group worked through Advice worker using thought Radiographer, therapeutic.  12:50 -1:00 Clinician led check-out. Clinician assessed for immediate needs, medication compliance and efficacy, and safety concerns  Therapist Response: 12:00 - 12:50 Pt engaged in discussion and demonstrates understanding by appropriately working through group examples.  12:50 - 1:00: At check-out, patient rates her mood at a  7 on a scale of 1-10 with 10 being great.Pt states afternoon plans of napping, tidying the house, and watching election coverage.Patient demonstrates someprogress as evidenced by participation in first group session.Patient denies SI/HI/self-harm at the end of group.    Suicidal/Homicidal: Nowithout intent/plan  Plan: Pt will continue in PHP while working to decrease depression symptoms, increase ability to manage symptoms in a healthy manner, and increase level of functioning.  Diagnosis: MDD (major depressive disorder), recurrent severe, without psychosis ([place]) [F33.2]    1. MDD (major depressive disorder), recurrent severe, without psychosis ([place])       Lorin Glass, LCSW 11/20/2018

## 2018-11-22 ENCOUNTER — Other Ambulatory Visit (HOSPITAL_COMMUNITY): Payer: 59

## 2018-11-22 NOTE — Psych (Signed)
Virtual Visit via Video Note  I connected with Gabriel Earing on 11/10/18 at  9:00 AM EST by a video enabled telemedicine application and verified that I am speaking with the correct person using two identifiers.   I discussed the limitations of evaluation and management by telemedicine and the availability of in person appointments. The patient expressed understanding and agreed to proceed.  I discussed the assessment and treatment plan with the patient. The patient was provided an opportunity to ask questions and all were answered. The patient agreed with the plan and demonstrated an understanding of the instructions.   The patient was advised to call back or seek an in-person evaluation if the symptoms worsen or if the condition fails to improve as anticipated.  Pt was provided 240 minutes of non-face-to-face time during this encounter.   Donia Guiles, LCSW    [facility] BH PHP THERAPIST PROGRESS NOTE  [place] Papania [id number]  Session Time: 9:00 - 10:00  Participation Level: Active  Behavioral Response: CasualAlertDepressed  Type of Therapy: Group Therapy  Treatment Goals addressed: Coping  Interventions: CBT, DBT, Supportive and Reframing  Summary: Clinician led check-in regarding current stressors and situation, and review of patient completed daily inventory. Clinician utilized active listening and empathetic response and validated patient emotions. Clinician facilitated processing group on pertinent issues.   Therapist Response:Nakesha Massie is a 28 y.o. female who presents with depression symptoms.  Patient arrived within time allowed and reports that she is feeling "sad and tired." Patient rates hermood at [facility] a scale of 1-10 with 10 being great. Pt reports her afternoon went "okay" and she went to an appointment, tidied up, and ordered food and relaxed. Pt states her mom returned home which was good and that pt spoke on the phone with someone in  recovery which was helpful. Pt states she has "less desire to drink today." Pt reports struggling with staying positive. Pt able to process. Patient engaged in discussion.       Session Time: 10:00-11:00  Participation Level:Active  Behavioral Response:CasualAlertDepressed  Type of Therapy: Group Therapy, psychoeducation, psychotherapy  Treatment Goals addressed: Coping  Interventions:CBT, DBT, Solution Focused, Supportive and Reframing  Summary:Cln led discussion on the emotion volcano and how "stuffing" our feelings can affect [place]. Cln proposed that "venting" out stresses and catching those stressors early can help keep our volcano from exploding. Group members discussed ways they could manage stressors.   Therapist Response: Pt engaged in discussion and reports she can relate to "stuffing" feelings. Pt identifies journaling and talking to friends as a way to manage stressors.      Session Time: 11:00 -12:00  Participation Level:Active  Behavioral Response:CasualAlertDepressed  Type of Therapy: Group Therapy, psychotherapy  Treatment Goals addressed: Coping  Interventions:Strengths based, reframing, Supportive,   Summary:Spiritual Care group  Therapist Response: Patient engaged in group. See chaplain note.       Session Time: 12:00- 1:00  Participation Level:Active  Behavioral Response:CasualAlertDepressed  Type of Therapy: Group Therapy, Psychoeducation  Treatment Goals addressed: Coping  Interventions:relaxation training; Supportive; Reframing  Summary:12:00- 12:50: Relaxation group: Cln led group focused on retraining the body's response to stress.  12:50 -1:00 Clinician led check-out. Clinician assessed for immediate needs, medication compliance and efficacy, and safety concerns   Therapist Response:12:00 - 12:50:Patient engaged in activity and discussion. 12:50 - 1:00: At check-out, patient  rates her mood at a  6.5 on a scale of 1-10 with 10 being great.Pt states afternoon plans of running errands, cutting her cats nails, and  washing her hair.Patient demonstrates someprogress as evidenced by not engaging in drinking yesterday.Patient denies SI/HI/self-harm at the end of group.    Suicidal/Homicidal: Nowithout intent/plan  Plan: Pt will continue in PHP while working to decrease depression symptoms, increase ability to manage symptoms in a healthy manner, and increase level of functioning.  Diagnosis: Major depressive disorder, recurrent severe without psychotic features ([place]) [F33.2]    1. Major depressive disorder, recurrent severe without psychotic features ([place])       Lorin Glass, LCSW 11/22/2018

## 2018-11-23 ENCOUNTER — Other Ambulatory Visit (HOSPITAL_COMMUNITY): Payer: 59

## 2018-11-23 ENCOUNTER — Ambulatory Visit (HOSPITAL_COMMUNITY): Payer: 59

## 2018-11-23 NOTE — Psych (Signed)
Virtual Visit via Video Note  I connected with Sharlee Blew on 11/11/18 at  9:00 AM EST by a video enabled telemedicine application and verified that I am speaking with the correct person using two identifiers.   I discussed the limitations of evaluation and management by telemedicine and the availability of in person appointments. The patient expressed understanding and agreed to proceed.  I discussed the assessment and treatment plan with the patient. The patient was provided an opportunity to ask questions and all were answered. The patient agreed with the plan and demonstrated an understanding of the instructions.   The patient was advised to call back or seek an in-person evaluation if the symptoms worsen or if the condition fails to improve as anticipated.  Pt was provided 240 minutes of non-face-to-face time during this encounter.   Lorin Glass, LCSW    [facility] BH PHP THERAPIST PROGRESS NOTE  Niley Helbig [id number]  Session Time: 9:00 - 10:00  Participation Level: Active  Behavioral Response: CasualAlertDepressed  Type of Therapy: Group Therapy  Treatment Goals addressed: Coping  Interventions: CBT, DBT, Supportive and Reframing  Summary: Clinician led check-in regarding current stressors and situation, and review of patient completed daily inventory. Clinician utilized active listening and empathetic response and validated patient emotions. Clinician facilitated processing group on pertinent issues.   Therapist Response:Alijah Pinkley is a 28 y.o. female who presents with depression symptoms.  Patient arrived within time allowed and reports that she is feeling "very tired." Patient rates hermood at a6.5on a scale of 1-10 with 10 being great. Pt reports she is sleeping better but remains "so tired." Pt reports yesterday went "pretty well" and she was able to complete her to-do list. Pt states she is beginning to look forward to returning to work  and thinks it will be a positive distraction. 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 Pt able to process. Patient engaged in discussion.      Session Time: 10:00 -11:00  Participation Level: Minimal  Behavioral Response: CasualAlertDepressed  Type of Therapy: Group Therapy, psychoeducation, psychotherapy  Treatment Goals addressed: Coping  Interventions: CBT, DBT, Solution Focused, Supportive and Reframing  Summary: Cln led discussion on healthy relationship traits. Cln introduced trust, honesty, and respect as mandatory traits for healthy relationships and group discussed important characteristics for them and how to recognize when the healthy dynamics are absent.   Therapist Response: Pt minimally engaged in discussion. Pt is tearful throughout group and states that this is difficult for her to talk about because of the recent death of her fiance who is her "soulmate" and the best relationship she has ever been in. Pt given space, however declines to share further at this time.         Session Time: 11:00- 12:00  Participation Level: Active  Behavioral Response: CasualAlertDepressed  Type of Therapy: Group Therapy, Psychoeducation; Psychotherapy  Treatment Goals addressed: Coping  Interventions: CBT; Solution focused; Supportive; Reframing  Summary: Cln introduced the topic of boundaries. Cln discussed the role boundaries play in our assessment of relationships, how things are going, and our ability to get needs met. Group discussed rigid, porous, and healthy boundary traits and identified where their boundaries default.    Therapist Response: Pt engaged in discussion and reports understanding of topic. Pt identifies her default boundary style as healthy and states she has worked hard to have healthier  boundaries while in the past  they were porous.          Session Time: 12:00 -1:00  Participation Level:Active  Behavioral Response:CasualAlertDepressed  Type of Therapy: Group Therapy, OT  Treatment Goals addressed: Coping  Interventions:Psychosocial skills training, Supportive,   Summary:12:00 - 12:50:Occupational Therapy group 12:50 -1:00 Clinician led check-out. Clinician assessed for immediate needs, medication compliance and efficacy, and safety concerns   Therapist Response:12:00 - 12:50:Patient engaged in group. See OT note.  12:50 - 1:00: At check-out, patient rates her mood at a  6.5 on a scale of 1-10 with 10 being great.Pt states afternoon plans of going to the grocery store and doing hygiene tasks.Patient demonstrates someprogress as evidenced by being able to complete full to-do list.Patient denies SI/HI/self-harm at the end of group.    Suicidal/Homicidal: Nowithout intent/plan  Plan: Pt will continue in PHP while working to decrease depression symptoms, increase ability to manage symptoms in a healthy manner, and increase level of functioning.  Diagnosis: Major depressive disorder, recurrent severe without psychotic features ([place]) [F33.2]    1. Major depressive disorder, recurrent severe without psychotic features ([place])       Lorin Glass, LCSW 11/23/2018

## 2018-11-24 ENCOUNTER — Other Ambulatory Visit (HOSPITAL_COMMUNITY): Payer: 59

## 2018-11-24 NOTE — Psych (Signed)
Virtual Visit via Video Note  I connected with Sharlee Blew on 11/12/18 at  9:00 AM EST by a video enabled telemedicine application and verified that I am speaking with the correct person using two identifiers.   I discussed the limitations of evaluation and management by telemedicine and the availability of in person appointments. The patient expressed understanding and agreed to proceed.  I discussed the assessment and treatment plan with the patient. The patient was provided an opportunity to ask questions and all were answered. The patient agreed with the plan and demonstrated an understanding of the instructions.   The patient was advised to call back or seek an in-person evaluation if the symptoms worsen or if the condition fails to improve as anticipated.  Pt was provided 240 minutes of non-face-to-face time during this encounter.   Lorin Glass, LCSW    [facility] BH PHP THERAPIST PROGRESS NOTE  Aurielle Slingerland [id number]  Session Time: 9:00 - 10:00  Participation Level: Active  Behavioral Response: CasualAlertDepressed  Type of Therapy: Group Therapy  Treatment Goals addressed: Coping  Interventions: CBT, DBT, Supportive and Reframing  Summary: Clinician led check-in regarding current stressors and situation, and review of patient completed daily inventory. Clinician utilized active listening and empathetic response and validated patient emotions. Clinician facilitated processing group on pertinent issues.   Therapist Response:Gwendelyn Mcquillen is a 28 y.o. female who presents with depression symptoms.  Patient arrived within time allowed and reports that she is feeling "okay." Patient rates hermood at a7.5on a scale of 1-10 with 10 being great. Pt reports she is looking forward to having a break over the weekend. Pt states she did errands and "crashed" early last night. Pt reports sleeping 8 hours and feeling her mood stabilizing, although it remains low.  Pt reports struggling with anxiety spirals. Pt able to process. Patient engaged in discussion.      Session Time: 10:00 -11:00  Participation Level: Active  Behavioral Response: CasualAlertDepressed  Type of Therapy: Group Therapy, psychoeducation, psychotherapy  Treatment Goals addressed: Coping  Interventions: CBT, DBT, Solution Focused, Supportive and Reframing  Summary: Cln led discussion on trust and how to establish  it. Group discussed issues they have with trusting. Cln introduced concept of trust foundations, making others prove their trustworthiness, and maintaining awareness of when it is appropriate to share what.   Therapist Response: Pt engaged in discussion and reports understanding of trust foundations. Pt states she has relationships with good trust however has a history of trusting too quickly.        Session Time: 11:00- 12:00  Participation Level: Active  Behavioral Response: CasualAlertDepressed  Type of Therapy: Group Therapy, Psychoeducation; Psychotherapy  Treatment Goals addressed: Coping  Interventions: CBT; Solution focused; Supportive; Reframing  Summary: Cln led discussion on anxiety regarding the presidential election. Cln created space for pt's to process their concerns and feelings regarding the social climate right now. Cln offered the self-soothe skill as a way to manage anxieties and pt's identified ways they can be kind to themselves during this time.    Therapist Response: Pt engaged in discussion and reports she is feeling "whatever" about the social climate. Pt states her fiance was interested in the election which has made it difficult to watch without transference. Pt is able to share and discuss. Pt states she will be kind to herself by giving herself a manicure.        Session Time: 12:00 -1:00  Participation Level:Active  Behavioral Response:CasualAlertDepressed  Type of Therapy: Group  Therapy, OT  Treatment Goals addressed: Coping  Interventions:Psychosocial skills training, Supportive,   Summary:12:00 - 12:50:Occupational Therapy group 12:50 -1:00 Clinician led check-out. Clinician assessed for immediate needs, medication compliance and efficacy, and safety concerns   Therapist Response:12:00 - 12:50:Patient engaged in group. See OT note.  12:50 - 1:00: At check-out, patient rates her mood at a  8.5 on a scale of 1-10 with 10 being great.Pt states afternoon plans of getting some sun outside and doing her nails.Patient demonstrates someprogress as evidenced by reporting increased mood stability.Patient denies SI/HI/self-harm at the end of group.    Suicidal/Homicidal: Nowithout intent/plan  Plan: Pt will continue in PHP while working to decrease depression symptoms, increase ability to manage symptoms in a healthy manner, and increase level of functioning.  Diagnosis: Major depressive disorder, recurrent severe without psychotic features (HCC) [F33.2]    1. Major depressive disorder, recurrent severe without psychotic features (HCC)       Donia Guiles, LCSW 11/24/2018

## 2018-11-24 NOTE — Psych (Signed)
Virtual Visit via Video Note  I connected with Gabriel Earing on 11/15/18 at  9:00 AM EST by a video enabled telemedicine application and verified that I am speaking with the correct person using two identifiers.   I discussed the limitations of evaluation and management by telemedicine and the availability of in person appointments. The patient expressed understanding and agreed to proceed.  I discussed the assessment and treatment plan with the patient. The patient was provided an opportunity to ask questions and all were answered. The patient agreed with the plan and demonstrated an understanding of the instructions.   The patient was advised to call back or seek an in-person evaluation if the symptoms worsen or if the condition fails to improve as anticipated.  Pt was provided 240 minutes of non-face-to-face time during this encounter.   Donia Guiles, LCSW    [facility] BH PHP THERAPIST PROGRESS NOTE  Jenniefer Salak [id number]  Session Time: 9:00 - 10:00  Participation Level: Active  Behavioral Response: CasualAlertDepressed  Type of Therapy: Group Therapy  Treatment Goals addressed: Coping  Interventions: CBT, DBT, Supportive and Reframing  Summary: Clinician led check-in regarding current stressors and situation, and review of patient completed daily inventory. Clinician utilized active listening and empathetic response and validated patient emotions. Clinician facilitated processing group on pertinent issues.   Therapist Response:Chari Flaharty is a 28 y.o. female who presents with depression symptoms.  Patient arrived within time allowed and reports that she is feeling "good except physically." Patient rates hermood at a7.5on a scale of 1-10 with 10 being great. Pt reports she is experiencing jaw pain and nausea. Pt states she clenches her jaw when stressed and this pain issue has occurred before. Pt states the dentist told her it was connected to stress  and no organic issue. Pt states she thinks the nausea is also from anxiety. Pt reports feeling stressed due to communications with her fiance's family and feeling unsure how they will continue.  Pt reports struggling with anxiety spirals. Pt able to process. Patient engaged in discussion.      Session Time: 10:00-11:00  Participation Level:Active  Behavioral Response:CasualAlertDepressed  Type of Therapy: Group Therapy, psychoeducation, psychotherapy  Treatment Goals addressed: Coping  Interventions:CBT, DBT, Solution Focused, Supportive and Reframing  Summary:Cln led discussion on addressing mental health struggles with people in your life. Group members identified concern of explaining work or life absences.  Group brainstormed ways to appropriately disclose information at their comfort level.    Therapist Response: Pt engaged in discussion and connected topic to her fiance's family. Pt shares some drama around rumors between the two families and how she is trying to navigate managing her mental health and the information that is already out about it.           Session Time: 11:00- 12:00  Participation Level:Active  Behavioral Response:CasualAlertDepressed  Type of Therapy: Group Therapy, Psychoeducation; Psychotherapy  Treatment Goals addressed: Coping  Interventions:DBT; Solution focused; Supportive; Reframing  Summary:Cln continued topic of boundaries. Cln reviewed the types of boundaries that may present and group discussed examples of each type from their own lives and which type of boundary they struggle with most.   Therapist Response: Pt engaged in discussion and identifies struggling most with emotional boundaries.        Session Time: 12:00 -1:00  Participation Level:Active  Behavioral Response:CasualAlertDepressed  Type of Therapy: Group Therapy, Psychoeducation; Psychotherapy  Treatment Goals  addressed: Coping  Interventions:DBT; Solution focused; Supportive; Reframing  Summary:Cln continued topic of boundaries  and discussed how to set healthy boundaries. Cln utilized handout "How to set a boundary" and group worked through pt examples to simulate the steps of setting and enforcing healthy boundaries.  12:50 -1:00 Clinician led check-out. Clinician assessed for immediate needs, medication compliance and efficacy, and safety concerns  Therapist Response: Pt engaged in discussion and demonstrates understanding of topic as evidenced by appropriately working through examples.  12:50 - 1:00: At check-out, patient rates her mood at a  6.5 on a scale of 1-10 with 10 being great.Pt states afternoon plans of resting and talking to her apartment complex.Patient demonstrates someprogress as evidenced by increased openness.Patient denies SI/HI/self-harm at the end of group.    Suicidal/Homicidal: Nowithout intent/plan  Plan: Pt will continue in PHP while working to decrease depression symptoms, increase ability to manage symptoms in a healthy manner, and increase level of functioning.  Diagnosis: Major depressive disorder, recurrent severe without psychotic features ([place]) [F33.2]    1. Major depressive disorder, recurrent severe without psychotic features ([place])       Lorin Glass, LCSW 11/24/2018

## 2018-11-25 ENCOUNTER — Other Ambulatory Visit (HOSPITAL_COMMUNITY): Payer: 59

## 2018-11-25 ENCOUNTER — Ambulatory Visit (HOSPITAL_COMMUNITY): Payer: 59

## 2018-11-26 ENCOUNTER — Other Ambulatory Visit (HOSPITAL_COMMUNITY): Payer: 59

## 2018-11-26 ENCOUNTER — Ambulatory Visit (HOSPITAL_COMMUNITY): Payer: 59

## 2018-11-28 NOTE — Psych (Signed)
Virtual Visit via Video Note  I connected with Grace Grant on 11/17/18 at  9:00 AM EST by a video enabled telemedicine application and verified that I am speaking with the correct person using two identifiers.   I discussed the limitations of evaluation and management by telemedicine and the availability of in person appointments. The patient expressed understanding and agreed to proceed.  I discussed the assessment and treatment plan with the patient. The patient was provided an opportunity to ask questions and all were answered. The patient agreed with the plan and demonstrated an understanding of the instructions.   The patient was advised to call back or seek an in-person evaluation if the symptoms worsen or if the condition fails to improve as anticipated.  Pt was provided 240 minutes of non-face-to-face time during this encounter.   Grace Glass, LCSW    [facility] BH PHP THERAPIST PROGRESS NOTE  Grace Grant [id number]  Session Time: 9:00 - 10:00  Participation Level: Active  Behavioral Response: CasualAlertDepressed  Type of Therapy: Group Therapy  Treatment Goals addressed: Coping  Interventions: CBT, DBT, Supportive and Reframing  Summary: Clinician led check-in regarding current stressors and situation, and review of patient completed daily inventory. Clinician utilized active listening and empathetic response and validated patient emotions. Clinician facilitated processing group on pertinent issues.   Therapist Response:Grace Grant is a 28 y.o. female who presents with depression symptoms.  Patient arrived within time allowed and reports that she is feeling "really anxious." Patient rates hermood at a4.5on a scale of 1-10 with 10 being great. Pt reports yesterday was "bad" and she feels "really guilty" about not coming to group. Pt states she received the autopsy results for her fiance and had negative back and forth with her fiance's sister.  Pt shares her mom is leaving this morning to return to [place] and this means pt will be staying on her own again. Pt is visibly anxious and hyper-verbal. Pt able to process. Patient engaged in discussion.       Session Time: 10:00-11:00  Participation Level:Active  Behavioral Response:CasualAlertDepressed  Type of Therapy: Group Therapy, psychoeducation, psychotherapy  Treatment Goals addressed: Coping  Interventions:CBT, DBT, Solution Focused, Supportive and Reframing  Summary:Cln introduced topic of vulnerability. Group viewed TED talk "The Power of vulnerability."  Cln led discussion on how vulnerability affects them and areas in which they can work on to increase vulnerability.     Therapist Response: Pt engaged in discussion and reports vulnerability is difficult for her in areas of control and her substance use.        Session Time: 11:00 -12:00  Participation Level:Active  Behavioral Response:CasualAlertDepressed  Type of Therapy: Group Therapy, psychotherapy  Treatment Goals addressed: Coping  Interventions:Strengths based, reframing, Supportive,   Summary:Spiritual Care group  Therapist Response: Patient engaged in group. See chaplain note.       Session Time: 12:00- 1:00  Participation Level:Active  Behavioral Response:CasualAlertDepressed  Type of Therapy: Group Therapy, Psychoeducation  Treatment Goals addressed: Coping  Interventions:relaxation training; Supportive; Reframing  Summary:12:00- 12:50: Relaxation group: Cln led group focused on retraining the body's response to stress.  12:50 -1:00 Clinician led check-out. Clinician assessed for immediate needs, medication compliance and efficacy, and safety concerns   Therapist Response:12:00 - 12:50:Patient engaged in activity and discussion. 12:50 - 1:00: At check-out, patient rates her mood at a  8 on a scale of 1-10 with 10 being  great.Pt states afternoon plans of calling friends and relaxing.Patient demonstrates someprogress as evidenced by sharing her  feelings and finding her own resolutions.Patient denies SI/HI/self-harm at the end of group.    Suicidal/Homicidal: Nowithout intent/plan  Plan: Pt will continue in PHP while working to decrease depression symptoms, increase ability to manage symptoms in a healthy manner, and increase level of functioning.  Diagnosis: Major depressive disorder, recurrent severe without psychotic features (HCC) [F33.2]    1. Major depressive disorder, recurrent severe without psychotic features (HCC)       Grace Guiles, LCSW 11/28/2018

## 2018-11-28 NOTE — Psych (Addendum)
Virtual Visit via Video Note  I connected with Gabriel Earing on 11/18/18 at  9:00 AM EST by a video enabled telemedicine application and verified that I am speaking with the correct person using two identifiers.   I discussed the limitations of evaluation and management by telemedicine and the availability of in person appointments. The patient expressed understanding and agreed to proceed.  I discussed the assessment and treatment plan with the patient. The patient was provided an opportunity to ask questions and all were answered. The patient agreed with the plan and demonstrated an understanding of the instructions.   The patient was advised to call back or seek an in-person evaluation if the symptoms worsen or if the condition fails to improve as anticipated.  Pt was provided 240 minutes of non-face-to-face time during this encounter.   Donia Guiles, LCSW    [facility] PHP THERAPIST PROGRESS NOTE  Idania Desouza [id number]  Session Time: 9:00 - 10:00  Participation Level: Active  Behavioral Response: CasualAlertDepressed  Type of Therapy: Group Therapy  Treatment Goals addressed: Coping  Interventions: CBT, DBT, Supportive and Reframing  Summary: Clinician led check-in regarding current stressors and situation, and review of patient completed daily inventory. Clinician utilized active listening and empathetic response and validated patient emotions. Clinician facilitated processing group on pertinent issues.   Therapist Response:Grace Grant is a 28 y.o. female who presents with depression symptoms.  Patient arrived within time allowed and reports that she is feeling "rested." [facility]-10 with 10 being great. Pt reports she slept well and feels "motivated to stay sober today." Pt reports she got out of the house yesterday for 1 hour by herself and it went well. Pt states she did drink yesterday, approximately 1 bottle of  wine, and plans to put the reasons she has for not drinking on her walls today as a deterrent.  Pt reports struggling with thinking about the future. Pt able to process. Patient engaged in discussion.      Session Time: 10:00 -11:00  Participation Level: Active  Behavioral Response: CasualAlertDepressed  Type of Therapy: Group Therapy, psychoeducation, psychotherapy  Treatment Goals addressed: Coping  Interventions: CBT, DBT, Solution Focused, Supportive and Reframing  Summary: Cln led discussion on perception and how it shapes the way we view our reality. Cln introduced concepts of being able to choose the reality you believe and how "feeling trapped" can be an issue with percpetion.   Therapist Response: Pt engaged in discussion and reports understanding of perception as a mutable lens through which our reality is shaped.       Session Time: 11:00- 12:00  Participation Level: Active  Behavioral Response: CasualAlertDepressed  Type of Therapy: Group Therapy, Psychoeducation; Psychotherapy  Treatment Goals addressed: Coping  Interventions: CBT; Solution focused; Supportive; Reframing  Summary: Cln introduced DBT TIP skill of progressive muscle relaxation. Cln led group through practice of PMR and discussed how working on the body can alter the experience of anxiety or other extreme emotions.     Therapist Response: Pt participated in activity and discussion and reports understanding of the skill.          Session Time: 12:00 -1:00  Participation Level:Active  Behavioral Response:CasualAlertDepressed  Type of Therapy: Group Therapy, OT  Treatment Goals addressed: Coping  Interventions:Psychosocial skills training, Supportive,   Summary:12:00 - 12:50:Occupational Therapy group 12:50 -1:00 Clinician led check-out. Clinician assessed for immediate needs, medication compliance and efficacy, and safety concerns   Therapist  Response:12:00 - 12:50:Patient engaged in group. See OT note.  12:50 - 1:00: At check-out, patient rates her mood at a  8 on a scale of 1-10 with 10 being great.Pt states afternoon plans of eating lunch, taking a nap, and "satying sober."Patient demonstrates someprogress as evidenced by increased honesty.Patient denies SI/HI/self-harm at the end of group.    Suicidal/Homicidal: Nowithout intent/plan  Plan: Pt will discharge from [facility] at her request and due to meeting treatment goals of decreased depression symptoms, increased ability to manage symptoms in a healthy manner, and increased level of functioning. Pt has declined to engage in IOP due to desire to return to work. Pt will return to her regular provider and has follow up appointment on 11/18 with her therapist. Pt and provider are aligned with discharge. Pt denies SI/HI at time of discharge.   Diagnosis: Major depressive disorder, recurrent severe without psychotic features ([place]) [F33.2]    1. Major depressive disorder, recurrent severe without psychotic features ([place])       Lorin Glass, LCSW 11/28/2018

## 2018-12-01 ENCOUNTER — Ambulatory Visit (INDEPENDENT_AMBULATORY_CARE_PROVIDER_SITE_OTHER): Payer: 59 | Admitting: Psychiatry

## 2018-12-01 ENCOUNTER — Other Ambulatory Visit: Payer: Self-pay

## 2018-12-01 DIAGNOSIS — F1020 Alcohol dependence, uncomplicated: Secondary | ICD-10-CM | POA: Insufficient documentation

## 2018-12-01 DIAGNOSIS — F331 Major depressive disorder, recurrent, moderate: Secondary | ICD-10-CM | POA: Diagnosis not present

## 2018-12-01 DIAGNOSIS — F411 Generalized anxiety disorder: Secondary | ICD-10-CM

## 2018-12-01 DIAGNOSIS — F4010 Social phobia, unspecified: Secondary | ICD-10-CM

## 2018-12-01 DIAGNOSIS — F1021 Alcohol dependence, in remission: Secondary | ICD-10-CM

## 2018-12-01 HISTORY — DX: Generalized anxiety disorder: F41.1

## 2018-12-01 HISTORY — DX: Alcohol dependence, in remission: F10.21

## 2018-12-01 HISTORY — DX: Social phobia, unspecified: F40.10

## 2018-12-01 MED ORDER — DULOXETINE HCL 30 MG PO CPEP: 90.0000 mg | ORAL_CAPSULE | Freq: Every day | ORAL | 0 refills | Status: DC

## 2018-12-01 MED ORDER — ZOLPIDEM TARTRATE ER 12.5 MG PO TBCR: 12.5000 mg | EXTENDED_RELEASE_TABLET | Freq: Every evening | ORAL | 1 refills | Status: DC | PRN

## 2018-12-01 MED ORDER — PRAZOSIN HCL 2 MG PO CAPS: 2.0000 mg | ORAL_CAPSULE | Freq: Every day | ORAL | 0 refills | Status: DC

## 2018-12-01 MED ORDER — NALTREXONE HCL 50 MG PO TABS: 50.0000 mg | ORAL_TABLET | Freq: Every day | ORAL | 2 refills | Status: AC

## 2018-12-01 NOTE — Progress Notes (Signed)
BH MD/PA/NP OP Progress Note  12/01/2018 10:38 AM Grace Grant  MRN:  [id number] Interview was conducted using WebEx teleconferencing application and I verified that I was speaking with the correct person using two identifiers. I discussed the limitations of evaluation and management by telemedicine and  the availability of in person appointments. Patient expressed understanding and agreed to proceed.  Chief Complaint: Insomnia, nightmares, anxiety/depression.  HPI: Patient is a 28 yo single white female who has recently been dmitted to [facility] (10/25-27/20) after intentional OD on several meciations with suicidal intent. 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 She continues to struggle with insomnia - previously failed trazodone (one of meds she OD on) and recently zolpidem 10 mg. It helped her fall asleep but she was still waking up. She feels depressed, tired, anxious, ruminates a lot, avoids being in groups of people (makes her very uncomfortable). She has no hx of mania or psychosis. She is not suicidal at this time. She reports being diagnosed with having GAD and OCD in the past. She was discharged on 120 mg of Cymbalta from [facility] but was only taking 60 mg. Hydroxyzine was prescribed for anxiety/sleep but was ineffective. Prazosin for nightmares was added but she did not take it thinking it is for blood pressure. She continues on buspirone which she has been taking for over a year.   This was her first psychiatric admission but she admits having one previous suicidal attempt by OD in 2018. Several of her symptoms are related to unexpected death of her fiancee in October 17, 2022 this year. She says she has been dx with PTSD because of that (she found him dead) but it appears to be more of an acute stress disorder reaction. Grace Grant additionally has been struggling with  alcohol drinking problem - she has been in recovery for about one year (attended AA) but has had few relapses recently including the day she OD. She admits to craving alcohol. She has an individual therapist.  Visit Diagnosis:    ICD-10-CM   1. Major depressive disorder, recurrent episode, moderate (HCC)  F33.1   2. GAD (generalized anxiety disorder)  F41.1   3. Social anxiety disorder  F40.10   4. Alcohol use disorder, moderate, in early remission (HCC)  F10.21     Past Psychiatric History: Please see intake H&P from recent hospitalization (11/01/18) and PHP (11/09/18).  Past Medical History:  Past Medical History:  Diagnosis Date  . Anxiety   . Bradycardia   . Depression     Past Surgical History:  Procedure Laterality Date  . WISDOM TOOTH EXTRACTION      Family Psychiatric History: Reved.  Family History:  Family History  Problem Relation Age of Onset  . Depression Mother   . Alcohol abuse Father   . Alcohol abuse Cousin     Social History:  Social History   Socioeconomic History  . Marital status: Single    Spouse name: Not on file  . Number of children: Not on file  . Years of education: Not on file  . Highest education level: Not on file  Occupational History  . Not on file  Social Needs  . Financial resource strain: Not hard at all  . Food insecurity    Worry: Never true    Inability: Never true  . Transportation needs    Medical: No  Non-medical: No  Tobacco Use  . Smoking status: Never Smoker  . Smokeless tobacco: Never Used  Substance and Sexual Activity  . Alcohol use: Yes    Comment: occ  . Drug use: Never  . Sexual activity: Yes    Birth control/protection: Pill  Lifestyle  . Physical activity    Days per week: 0 days    Minutes per session: Not on file  . Stress: Very much  Relationships  . Social Musicianconnections    Talks on phone: More than three times a week    Gets together: Never    Attends religious service: Never    Active  member of club or organization: No    Attends meetings of clubs or organizations: Never    Relationship status: Not on file  Other Topics Concern  . Not on file  Social History Narrative  . Not on file    Allergies: No Known Allergies  Metabolic Disorder Labs: No results found for: HGBA1C, MPG No results found for: PROLACTIN No results found for: CHOL, TRIG, HDL, CHOLHDL, VLDL, LDLCALC No results found for: TSH  Therapeutic Level Labs: No results found for: LITHIUM No results found for: VALPROATE No components found for:  CBMZ  Current Medications: Current Outpatient Medications  Medication Sig Dispense Refill  . busPIRone (BUSPAR) 15 MG tablet Take 1 tablet (15 mg total) by mouth 3 (three) times daily. 90 tablet 2  . DULoxetine (CYMBALTA) 30 MG capsule Take 3 capsules (90 mg total) by mouth daily. 90 capsule 0  . naltrexone (DEPADE) 50 MG tablet Take 1 tablet (50 mg total) by mouth daily. 30 tablet 2  . pantoprazole (PROTONIX) 40 MG tablet Take 1 tablet (40 mg total) by mouth daily. (Patient not taking: Reported on 11/09/2018) 90 tablet 1  . prazosin (MINIPRESS) 2 MG capsule Take 1 capsule (2 mg total) by mouth at bedtime. 30 capsule 0  . Prenat Vit-Fe Gly Cys-FA-Omega (ENBRACE HR) CAPS Take 1 capsule by mouth daily. [email or web address] (Patient not taking: Reported on 11/09/2018) 90 capsule 2  . zolpidem (AMBIEN CR) 12.5 MG CR tablet Take 1 tablet (12.5 mg total) by mouth at bedtime as needed for sleep. 30 tablet 1   No current facility-administered medications for this visit.       Psychiatric Specialty Exam: Review of Systems  Psychiatric/Behavioral: Positive for depression. The patient is nervous/anxious and has insomnia.   All other systems reviewed and are negative.   There were no vitals taken for this visit.There is no height or weight on file to calculate BMI.  General Appearance: Casual and Fairly Groomed  Eye Contact:  Fair  Speech:  Clear and Coherent  and Normal Rate  Volume:  Normal  Mood:  Anxious and Depressed  Affect:  Congruent and Constricted  Thought Process:  Goal Directed and Linear  Orientation:  Full (Time, Place, and Person)  Thought Content: Rumination   Suicidal Thoughts:  No  Homicidal Thoughts:  No  Memory:  Immediate;   Good Recent;   Good Remote;   Good  Judgement:  Good  Insight:  Fair  Psychomotor Activity:  Normal  Concentration:  Concentration: Fair  Recall:  Good  Fund of Knowledge: Good  Language: Good  Akathisia:  Negative  Handed:  Right  AIMS (if indicated): not done  Assets:  Communication Skills Desire for Improvement Financial Resources/Insurance Housing Physical Health Resilience Talents/Skills  ADL's:  Intact  Cognition: WNL  Sleep:  Poor   Screenings:  AIMS     Admission (Discharged) from 10/31/2018 in [place] [facility]  AIMS Total Score  0    PHQ2-9     Counselor from 11/15/2018 in [place] Counselor from 11/09/2018 in [place]  PHQ-2 Total Score  1  6  PHQ-9 Total Score  10  19       Assessment and Plan: 28 yo single white female who has recently been dmitted to [facility] (10/25-27/20) after intentional OD on several meciations with suicidal intent. 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 She continues to struggle with insomnia - previously failed trazodone (one of meds she OD on) and recently zolpidem 10 mg. It helped her fall asleep but she was still waking up. She feels depressed, tired, anxious, ruminates a lot, avoids being in groups of people (makes her very uncomfortable). She has no hx of mania or psychosis. She is not suicidal at this time. She reports being diagnosed with having GAD and OCD in the past. She was discharged on 120 mg of Cymbalta  from [facility] but was only taking 60 mg. Hydroxyzine was prescribed for anxiety/sleep but was ineffective. Prazosin for nightmares was added but she did not take it thinking it is for blood pressure. She continues on buspirone which she has been taking for over a year.   This was her first psychiatric admission but she admits having one previous suicidal attempt by OD in 2018. Several of her symptoms are related to unexpected death of her fiancee in 10-05-2022 this year. She says she has been dx with PTSD because of that (she found him dead) but it appears to be more of an acute stress disorder reaction. Desirey additionally has been struggling with alcohol drinking problem - she has been in recovery for about one year (attended AA) but has had few relapses recently including the day she OD. She admits to craving alcohol. She has an Haematologist.  Dx: MDD recurrent moderate; GAD/Social anxiety disorder; Alcohol use disorder in early, partial remission  Plan: Continue buspirone 15 mg tid, increase duloxetine to 90 mg daily, restart prazosin 2 mg at HS for nightmares, change zolpidem to CR 12.5 mg at HS for insomnia and add naltrexone 50 mg for alcohol cravings. Continue individual therapy. Next appointment in one month. The plan was discussed with patient who had an opportunity to ask questions and these were all answered. I spend 40 minutes in videoconferencing with the patient and devoted approximately 50% of this time to explanation of diagnosis, discussion of treatment options and med education.  Stephanie Acre, MD 12/01/2018, 10:38 AM

## 2018-12-06 ENCOUNTER — Other Ambulatory Visit (HOSPITAL_COMMUNITY): Payer: Self-pay | Admitting: *Deleted

## 2018-12-06 MED ORDER — DULOXETINE HCL 30 MG PO CPEP: 90.0000 mg | ORAL_CAPSULE | Freq: Every day | ORAL | 0 refills | Status: DC

## 2018-12-20 ENCOUNTER — Other Ambulatory Visit (HOSPITAL_COMMUNITY): Payer: Self-pay | Admitting: *Deleted

## 2018-12-20 MED ORDER — BUSPIRONE HCL 15 MG PO TABS: 15.0000 mg | ORAL_TABLET | Freq: Three times a day (TID) | ORAL | 2 refills | Status: DC

## 2018-12-27 ENCOUNTER — Telehealth (HOSPITAL_COMMUNITY): Payer: Self-pay | Admitting: *Deleted

## 2018-12-27 ENCOUNTER — Other Ambulatory Visit (HOSPITAL_COMMUNITY): Payer: Self-pay | Admitting: Psychiatry

## 2018-12-27 MED ORDER — PRAZOSIN HCL 2 MG PO CAPS: 2.0000 mg | ORAL_CAPSULE | Freq: Every day | ORAL | 0 refills | Status: DC

## 2018-12-27 MED ORDER — ZOLPIDEM TARTRATE ER 12.5 MG PO TBCR: 12.5000 mg | EXTENDED_RELEASE_TABLET | Freq: Every evening | ORAL | 0 refills | Status: DC | PRN

## 2018-12-27 NOTE — Telephone Encounter (Signed)
Patient called requested refill : zolpidem (AMBIEN CR) 12.5 MG CR tablet  &&  prazosin (MINIPRESS) 2 MG capsule Patient states she's out of town for holiday &  added [facility] in [place] for refills to be sent to preferred Rx  UPDATED DIN EPIC

## 2018-12-27 NOTE — Telephone Encounter (Signed)
Patient called requesting refill : zolpidem (AMBIEN CR) 12.5 MG CR tablet  &&  prazosin (MINIPRESS) 2 MG capsule.  patient stated she's out of town  & [facility] [place] updated in [place] -- 2nd on

## 2018-12-27 NOTE — Telephone Encounter (Signed)
One month refills send to [place].

## 2019-01-04 ENCOUNTER — Ambulatory Visit (INDEPENDENT_AMBULATORY_CARE_PROVIDER_SITE_OTHER): Payer: Managed Care, Other (non HMO) | Admitting: Psychiatry

## 2019-01-04 ENCOUNTER — Other Ambulatory Visit: Payer: Self-pay

## 2019-01-04 DIAGNOSIS — F4010 Social phobia, unspecified: Secondary | ICD-10-CM

## 2019-01-04 DIAGNOSIS — F4310 Post-traumatic stress disorder, unspecified: Secondary | ICD-10-CM

## 2019-01-04 DIAGNOSIS — F331 Major depressive disorder, recurrent, moderate: Secondary | ICD-10-CM

## 2019-01-04 DIAGNOSIS — F411 Generalized anxiety disorder: Secondary | ICD-10-CM | POA: Diagnosis not present

## 2019-01-04 MED ORDER — ZOLPIDEM TARTRATE ER 12.5 MG PO TBCR: 12.5000 mg | EXTENDED_RELEASE_TABLET | Freq: Every evening | ORAL | 2 refills | Status: DC | PRN

## 2019-01-04 MED ORDER — DULOXETINE HCL 60 MG PO CPEP: 60.0000 mg | ORAL_CAPSULE | Freq: Two times a day (BID) | ORAL | 1 refills | Status: DC

## 2019-01-04 MED ORDER — PRAZOSIN HCL 1 MG PO CAPS: 1.0000 mg | ORAL_CAPSULE | Freq: Three times a day (TID) | ORAL | 2 refills | Status: DC

## 2019-01-04 NOTE — Progress Notes (Signed)
[place] MD/PA/NP OP Progress Note  01/04/2019 3:50 PM Grace Grant  MRN:  [id number] Interview was conducted using WebEx teleconferencing application and I verified that I was speaking with the correct person using two identifiers. I discussed the limitations of evaluation and management by telemedicine and  the availability of in person appointments. Patient expressed understanding and agreed to proceed.  Chief Complaint: Anxiety, flashbacks.  HPI: 28 yo single white female who has recently been dmitted to [facility] (10/25-27/20) after intentional OD on several meciations with suicidal intent. 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 She continues to struggle with insomnia - previously failed trazodone (one of meds she OD on) and recently zolpidem 10 mg. It helped her fall asleep but she was still waking up. She feels depressed, tired, anxious, ruminates a lot, avoids being in groups of people (makes her very uncomfortable). She has no hx of mania or psychosis. She is not suicidal at this time. She reports being diagnosed with having GAD and OCD in the past. She was discharged on 120 mg of Cymbalta from [facility] but was only taking 60 mg. Hydroxyzine was prescribed for anxiety/sleep but was ineffective. Prazosin for nightmares was added but she did not take it thinking it is for blood pressure. She continues on buspirone which she has been taking for over a year. This was her first psychiatric admission but she admits having one previous suicidal attempt by OD in 2018. Several of her symptoms are related to unexpected death of her fiancee in 10/09/2022 this year. She says she has been dx with PTSD because of that (she found him dead) but it appears to be more of an acute stress disorder reaction. Grace Grant additionally has been struggling with alcohol drinking problem - she has been in  recovery for about one year (attended AA) but has had few relapses recently including the day she OD. She admits to craving alcohol. She has an individual therapist. We have increased Cymbalta to 980 mg restarted prazosin 2 mg at HS and changed Ambien to CR. Sleep has improved and nightmares resolved. Depression subsided some but she is still anxious, has tension headaches and reports daily flashbacks. She remains sober and has not started to use naltrexone.   Visit Diagnosis:    ICD-10-CM   1. GAD (generalized anxiety disorder)  F41.1   2. Major depressive disorder, recurrent episode, moderate (HCC)  F33.1   3. Posttraumatic stress disorder  F43.10   4. Social anxiety disorder  F40.10     Past Psychiatric History: Please see intake H&P.  Past Medical History:  Past Medical History:  Diagnosis Date  . Anxiety   . Bradycardia   . Depression     Past Surgical History:  Procedure Laterality Date  . WISDOM TOOTH EXTRACTION      Family Psychiatric History: Reviewed.  Family History:  Family History  Problem Relation Age of Onset  . Depression Mother   . Alcohol abuse Father   . Alcohol abuse Cousin     Social History:  Social History   Socioeconomic History  . Marital status: Single    Spouse name: Not on file  . Number of children: Not on file  . Years of education: Not on file  . Highest education level: Not on file  Occupational History  . Not on file  Tobacco Use  .  Smoking status: Never Smoker  . Smokeless tobacco: Never Used  Substance and Sexual Activity  . Alcohol use: Yes    Comment: occ  . Drug use: Never  . Sexual activity: Yes    Birth control/protection: Pill  Other Topics Concern  . Not on file  Social History Narrative  . Not on file   Social Determinants of Health   Financial Resource Strain: Low Risk   . Difficulty of Paying Living Expenses: Not hard at all  Food Insecurity: No Food Insecurity  . Worried About Programme researcher, broadcasting/film/video in the  Last Year: Never true  . Ran Out of Food in the Last Year: Never true  Transportation Needs: No Transportation Needs  . Lack of Transportation (Medical): No  . Lack of Transportation (Non-Medical): No  Physical Activity: Unknown  . Days of Exercise per Week: 0 days  . Minutes of Exercise per Session: Not on file  Stress: Stress Concern Present  . Feeling of Stress : Very much  Social Connections: Unknown  . Frequency of Communication with Friends and Family: More than three times a week  . Frequency of Social Gatherings with Friends and Family: Never  . Attends Religious Services: Never  . Active Member of Clubs or Organizations: No  . Attends Banker Meetings: Never  . Marital Status: Not on file    Allergies: No Known Allergies  Metabolic Disorder Labs: No results found for: HGBA1C, MPG No results found for: PROLACTIN No results found for: CHOL, TRIG, HDL, CHOLHDL, VLDL, LDLCALC No results found for: TSH  Therapeutic Level Labs: No results found for: LITHIUM No results found for: VALPROATE No components found for:  CBMZ  Current Medications: Current Outpatient Medications  Medication Sig Dispense Refill  . busPIRone (BUSPAR) 15 MG tablet Take 1 tablet (15 mg total) by mouth 3 (three) times daily. 90 tablet 2  . DULoxetine (CYMBALTA) 60 MG capsule Take 1 capsule (60 mg total) by mouth 2 (two) times daily. 60 capsule 1  . naltrexone (DEPADE) 50 MG tablet Take 1 tablet (50 mg total) by mouth daily. 30 tablet 2  . pantoprazole (PROTONIX) 40 MG tablet Take 1 tablet (40 mg total) by mouth daily. (Patient not taking: Reported on 11/09/2018) 90 tablet 1  . prazosin (MINIPRESS) 1 MG capsule Take 1 capsule (1 mg total) by mouth 3 (three) times daily. Take one capsule in AM and two at bedtime. 90 capsule 2  . Prenat Vit-Fe Gly Cys-FA-Omega (ENBRACE HR) CAPS Take 1 capsule by mouth daily. May get from [email or web address] (Patient not taking: Reported on 11/09/2018) 90 capsule 2  .  [START ON 01/26/2019] zolpidem (AMBIEN CR) 12.5 MG CR tablet Take 1 tablet (12.5 mg total) by mouth at bedtime as needed for sleep. 30 tablet 2   No current facility-administered medications for this visit.      Psychiatric Specialty Exam: Review of Systems  Neurological: Positive for headaches.  Psychiatric/Behavioral: Positive for sleep disturbance. The patient is nervous/anxious.   All other systems reviewed and are negative.   There were no vitals taken for this visit.There is no height or weight on file to calculate BMI.  General Appearance: Casual and Well Groomed  Eye Contact:  Good  Speech:  Clear and Coherent and Normal Rate  Volume:  Normal  Mood:  Anxious and Depressed  Affect:  Constricted  Thought Process:  Goal Directed and Linear  Orientation:  Full (Time, Place, and Person)  Thought Content: Rumination   Suicidal  Thoughts:  No  Homicidal Thoughts:  No  Memory:  Immediate;   Good Recent;   Good Remote;   Good  Judgement:  Good  Insight:  Good  Psychomotor Activity:  Normal  Concentration:  Concentration: Good  Recall:  Good  Fund of Knowledge: Good  Language: Good  Akathisia:  Negative  Handed:  Right  AIMS (if indicated): not done  Assets:  Communication Skills Desire for Improvement Financial Resources/Insurance Housing Resilience Talents/Skills  ADL's:  Intact  Cognition: WNL  Sleep:  Fair   Screenings: AIMS     [facility]  AIMS Total Score  0    PHQ2-9     Counselor from 11/15/2018 in [facility] Counselor from 11/09/2018 in [facility]  PHQ-2 Total Score  1  6  PHQ-9 Total Score  10  19       Assessment and Plan: 28 yo single white female who has [facility]10[facility]) after intentional OD on several meciations with suicidal intent. 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 She continues to struggle with insomnia - previously failed trazodone (one of meds she OD on) and recently zolpidem 10 mg. It helped her fall asleep but she was still waking up. She feels depressed, tired, anxious, ruminates a lot, avoids being in groups of people (makes her very uncomfortable). She has no hx of mania or psychosis. She is not suicidal at this time. She reports being diagnosed with having GAD and OCD in the past. She [facility] but was only taking 60 mg. Hydroxyzine was prescribed for anxiety/sleep but was ineffective. Prazosin for nightmares was added but she did not take it thinking it is for blood pressure. She continues on buspirone which she has been taking for over a year. This was her first psychiatric admission but she admits having one previous suicidal attempt by OD in 2018. Several of her symptoms are related to unexpected death of her fiancee in September this year. She says she has been dx with PTSD because of that (she found him dead) but it appears to be more of an acute stress disorder reaction. Grace LeatherwoodKatherine additionally has been struggling with alcohol drinking problem - she has been in recovery for about one year (attended AA) but has had few relapses recently including the day she OD. She admits to craving alcohol. She has an individual therapist. We have increased Cymbalta to 980 mg restarted prazosin 2 mg at HS and changed Ambien to CR. Sleep has improved and nightmares resolved. Depression subsided some but she is still anxious, has tension headaches and reports daily flashbacks. She remains sober and has not started to use naltrexone.  Dx: MDD recurrent moderate; GAD/Social anxiety disorder; Alcohol use disorder in early, partial remission  Plan: Continue buspirone 15 mg tid, increase duloxetine to 60 mg  bid, change prazosin to 1 mg in AM and 2 mg at HS for fl;ashbacks/nightmares, continue zolpidem CR 12.5 mg at HS for insomnia.  Continue individual therapy. Next appointment in one month. The plan was discussed with patient who had an opportunity to ask questions and these were all answered. I spend 25 minutes in videoconferencing with the patient    Magdalene Patricialgierd A Seymore Brodowski, MD  01/04/2019, 3:50 PM

## 2019-01-24 ENCOUNTER — Other Ambulatory Visit (HOSPITAL_COMMUNITY): Payer: Self-pay | Admitting: Psychiatry

## 2019-01-24 ENCOUNTER — Telehealth (HOSPITAL_COMMUNITY): Payer: Self-pay | Admitting: *Deleted

## 2019-01-24 MED ORDER — ESCITALOPRAM OXALATE 10 MG PO TABS: 10.0000 mg | ORAL_TABLET | Freq: Every day | ORAL | 0 refills | Status: DC

## 2019-01-24 NOTE — Telephone Encounter (Signed)
Received phone call from pt requesting d/cing the Cymbalta and restarting Lexapro as she feels Cymbalta not working anymore. Writer called pt back but had to leave a message. Writer encouraged pt to go to ED if having any s.i. Pt also encouraged to call writer back to discuss issues with medication.

## 2019-01-24 NOTE — Telephone Encounter (Signed)
I have added Lexapro 10 mg - she should go down to one 60 mg caps of Cymbalta for one week then stop and start taking two 10 mg tabs of Lexapro until her next visit on with me on 02/04/19.

## 2019-02-04 ENCOUNTER — Other Ambulatory Visit: Payer: Self-pay

## 2019-02-04 ENCOUNTER — Ambulatory Visit (INDEPENDENT_AMBULATORY_CARE_PROVIDER_SITE_OTHER): Payer: Managed Care, Other (non HMO) | Admitting: Psychiatry

## 2019-02-04 DIAGNOSIS — F4010 Social phobia, unspecified: Secondary | ICD-10-CM | POA: Diagnosis not present

## 2019-02-04 DIAGNOSIS — F331 Major depressive disorder, recurrent, moderate: Secondary | ICD-10-CM

## 2019-02-04 DIAGNOSIS — F411 Generalized anxiety disorder: Secondary | ICD-10-CM

## 2019-02-04 DIAGNOSIS — F4310 Post-traumatic stress disorder, unspecified: Secondary | ICD-10-CM

## 2019-02-04 DIAGNOSIS — F1021 Alcohol dependence, in remission: Secondary | ICD-10-CM | POA: Diagnosis not present

## 2019-02-04 MED ORDER — ESZOPICLONE 3 MG PO TABS: 3.0000 mg | ORAL_TABLET | Freq: Every day | ORAL | 0 refills | Status: DC

## 2019-02-04 MED ORDER — ESCITALOPRAM OXALATE 20 MG PO TABS: 20.0000 mg | ORAL_TABLET | Freq: Every day | ORAL | 0 refills | Status: DC

## 2019-02-04 NOTE — Progress Notes (Signed)
[facility] MD/PA/NP OP Progress Note  02/04/2019 10:55 AM Grace Grant  MRN:  [id number] Interview was conducted using WebEx teleconferencing application and I verified that I was speaking with the correct person using two identifiers. I discussed the limitations of evaluation and management by telemedicine and  the availability of in person appointments. Patient expressed understanding and agreed to proceed.  Chief Complaint: Depression, anxiety, insomnia.  HPI: 29 yo single white female who has recently been dmitted to [facility] (10/25-27/20) after intentional OD on several meciations with suicidal intent. 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 She continues to struggle with insomnia - previously failed trazodone (one of meds she OD on) and recently zolpidem 10 mg. It helped her fall asleep but she was still waking up. She feels depressed, tired, anxious, ruminates a lot, avoids being in groups of people (makes her very uncomfortable). She has no hx of mania or psychosis. She is not suicidal at this time. She reports being diagnosed with having GAD and OCD in the past. She was discharged on 120 mg of Cymbalta from [facility] but was only taking 60 mg. Hydroxyzine was prescribed for anxiety/sleep but was ineffective. Prazosin for nightmares was added but she did not take it thinking it is for blood pressure. She continues on buspirone which she has been taking for over a year. This was her first psychiatric admission but she admits having one previous suicidal attempt by OD in 2018. Several of her symptoms are related to unexpected death of her fiancee in 05-Nov-2022 this year. She says she has been dx with PTSD because of that (she found him dead) but it appears to be more of an acute stress disorder reaction. Tammera additionally has been struggling with alcohol drinking problem - she  has been in recovery for about one year (attended AA) but has had few relapses recently including the day she OD. She admits to craving alcohol. She has an individual therapist. We have increased Cymbalta to 90 mg restarted prazosin 2 mg at HS and changed Ambien to CR. She noticed that her mood started to deteriorate and sleep has not improved. We the stopped Cymbalta a week ago and restarted Lexapro 10 mg which she has been on before with good response. Nightmares and flashbacks stopped s- she only takes prazosin 2 mg at HS. She remains sober and has not started to use naltrexone.    Visit Diagnosis:    ICD-10-CM   1. GAD (generalized anxiety disorder)  F41.1   2. Alcohol use disorder, moderate, in early remission (HCC)  F10.21   3. Major depressive disorder, recurrent episode, moderate (HCC)  F33.1   4. Posttraumatic stress disorder  F43.10   5. Social anxiety disorder  F40.10     Past Psychiatric History: Please see intakle H&P.  Past Medical History:  Past Medical History:  Diagnosis Date  . Anxiety   . Bradycardia   . Depression     Past Surgical History:  Procedure Laterality Date  . WISDOM TOOTH EXTRACTION      Family Psychiatric History: Reviewed.  Family History:  Family History  Problem Relation Age of Onset  . Depression Mother   . Alcohol abuse Father   . Alcohol abuse Cousin     Social History:  Social History   Socioeconomic History  . Marital status: Single    Spouse  name: Not on file  . Number of children: Not on file  . Years of education: Not on file  . Highest education level: Not on file  Occupational History  . Not on file  Tobacco Use  . Smoking status: Never Smoker  . Smokeless tobacco: Never Used  Substance and Sexual Activity  . Alcohol use: Yes    Comment: occ  . Drug use: Never  . Sexual activity: Yes    Birth control/protection: Pill  Other Topics Concern  . Not on file  Social History Narrative  . Not on file   Social  Determinants of Health   Financial Resource Strain: Low Risk   . Difficulty of Paying Living Expenses: Not hard at all  Food Insecurity: No Food Insecurity  . Worried About Programme researcher, broadcasting/film/video in the Last Year: Never true  . Ran Out of Food in the Last Year: Never true  Transportation Needs: No Transportation Needs  . Lack of Transportation (Medical): No  . Lack of Transportation (Non-Medical): No  Physical Activity: Unknown  . Days of Exercise per Week: 0 days  . Minutes of Exercise per Session: Not on file  Stress: Stress Concern Present  . Feeling of Stress : Very much  Social Connections: Unknown  . Frequency of Communication with Friends and Family: More than three times a week  . Frequency of Social Gatherings with Friends and Family: Never  . Attends Religious Services: Never  . Active Member of Clubs or Organizations: No  . Attends Banker Meetings: Never  . Marital Status: Not on file    Allergies: No Known Allergies  Metabolic Disorder Labs: No results found for: HGBA1C, MPG No results found for: PROLACTIN No results found for: CHOL, TRIG, HDL, CHOLHDL, VLDL, LDLCALC No results found for: TSH  Therapeutic Level Labs: No results found for: LITHIUM No results found for: VALPROATE No components found for:  CBMZ  Current Medications: Current Outpatient Medications  Medication Sig Dispense Refill  . escitalopram (LEXAPRO) 20 MG tablet Take 1 tablet (20 mg total) by mouth daily. 30 tablet 0  . Eszopiclone 3 MG TABS Take 1 tablet (3 mg total) by mouth at bedtime. Take immediately before bedtime 30 tablet 0  . naltrexone (DEPADE) 50 MG tablet Take 1 tablet (50 mg total) by mouth daily. 30 tablet 2  . pantoprazole (PROTONIX) 40 MG tablet Take 1 tablet (40 mg total) by mouth daily. (Patient not taking: Reported on 11/09/2018) 90 tablet 1  . prazosin (MINIPRESS) 1 MG capsule Take 1 capsule (1 mg total) by mouth 3 (three) times daily. Take one capsule in AM and  two at bedtime. 90 capsule 2  . Prenat Vit-Fe Gly Cys-FA-Omega (ENBRACE HR) CAPS Take 1 capsule by mouth daily. May get from [email or web address] (Patient not taking: Reported on 11/09/2018) 90 capsule 2   No current facility-administered medications for this visit.     Psychiatric Specialty Exam: Review of Systems  Psychiatric/Behavioral: Positive for sleep disturbance. The patient is nervous/anxious.   All other systems reviewed and are negative.   There were no vitals taken for this visit.There is no height or weight on file to calculate BMI.  General Appearance: Casual, well goomed.  Eye Contact:  Good.  Speech:  Clear and Coherent and Normal Rate  Volume:  Normal  Mood:  Anxious and Depressed  Affect:  Constricted  Thought Process:  Goal Directed and Linear  Orientation:  Full (Time, Place, and Person)  Thought Content: Logical  Suicidal Thoughts:  No  Homicidal Thoughts:  No  Memory:  Immediate;   Good Recent;   Good Remote;   Good  Judgement:  Good  Insight:  Good  Psychomotor Activity:  Normal  Concentration:  Concentration: Good  Recall:  Good  Fund of Knowledge: Good  Language: Good  Akathisia:  Negative  Handed:  Right  AIMS (if indicated): not done  Assets:  Communication Skills Desire for Improvement Financial Resources/Insurance Housing Resilience Talents/Skills  ADL's:  Intact  Cognition: WNL  Sleep:  Poor   Screenings: AIMS     Admission (Discharged) from 10/31/2018 in [facility]  AIMS Total Score  0    PHQ2-9     Counselor from 11/15/2018 in [place] Counselor from 11/09/2018 in [place]  PHQ-2 Total Score  1  6  PHQ-9 Total Score  10  19       Assessment and Plan: 29 yo single white female who has recently been dmitted to [facility] (10/25-27/20) after intentional OD on several meciations with suicidal intent. 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 She continues to struggle with insomnia - previously failed trazodone (one of meds she OD on) and recently zolpidem 10 mg. It helped her fall asleep but she was still waking up. She feels depressed, tired, anxious, ruminates a lot, avoids being in groups of people (makes her very uncomfortable). She has no hx of mania or psychosis. She is not suicidal at this time. She reports being diagnosed with having GAD and OCD in the past. She was discharged on 120 mg of Cymbalta from [facility] but was only taking 60 mg. Hydroxyzine was prescribed for anxiety/sleep but was ineffective. Prazosin for nightmares was added but she did not take it thinking it is for blood pressure. She continues on buspirone which she has been taking for over a year. This was her first psychiatric admission but she admits having one previous suicidal attempt by OD in 2018. Several of her symptoms are related to unexpected death of her fiancee in 10/12/2022 this year. She says she has been dx with PTSD because of that (she found him dead) but it appears to be more of an acute stress disorder reaction. Jalissa additionally has been struggling with alcohol drinking problem - she has been in recovery for about one year (attended AA) but has had few relapses recently including the day she OD. She admits to craving alcohol. She has an individual therapist. We have increased Cymbalta to 90 mg restarted prazosin 2 mg at HS and changed Ambien to CR. She noticed that her mood started to deteriorate and sleep has not improved. We the stopped Cymbalta a week ago and restarted Lexapro 10 mg which she has been on before with good response. Nightmares and flashbacks stopped s- she only takes prazosin 2 mg at HS. She remains sober and has not started to use naltrexone.  Dx: MDD recurrent moderate; GAD/Social anxiety  disorder; Alcohol use disorder in early, partial remission  Plan: Increase Laxapro to 20 mg, dc Ambien and buspirone (gradually) and start Lunesta 3 mg for insomnia. Continue prazosin 2 mg at HS for nightmares and naltrexone for alcohol craving. Continue individual therapy. Next appointment in one month.The plan was discussed with patient who had an opportunity to ask questions  and these were all answered. I spend in videoconferencingwith the patient    Grace Patricia, MD 02/04/2019, 10:55 AM

## 2019-03-04 ENCOUNTER — Other Ambulatory Visit: Payer: Self-pay

## 2019-03-04 ENCOUNTER — Ambulatory Visit (INDEPENDENT_AMBULATORY_CARE_PROVIDER_SITE_OTHER): Payer: 59 | Admitting: Psychiatry

## 2019-03-04 DIAGNOSIS — F411 Generalized anxiety disorder: Secondary | ICD-10-CM

## 2019-03-04 DIAGNOSIS — F330 Major depressive disorder, recurrent, mild: Secondary | ICD-10-CM

## 2019-03-04 DIAGNOSIS — F1021 Alcohol dependence, in remission: Secondary | ICD-10-CM | POA: Diagnosis not present

## 2019-03-04 DIAGNOSIS — F4010 Social phobia, unspecified: Secondary | ICD-10-CM | POA: Diagnosis not present

## 2019-03-04 MED ORDER — ESCITALOPRAM OXALATE 20 MG PO TABS: 20.0000 mg | ORAL_TABLET | Freq: Every day | ORAL | 1 refills | Status: DC

## 2019-03-04 MED ORDER — HYDROXYZINE HCL 25 MG PO TABS: 25.0000 mg | ORAL_TABLET | Freq: Two times a day (BID) | ORAL | 2 refills | Status: DC | PRN

## 2019-03-04 NOTE — Progress Notes (Signed)
BH MD/PA/NP OP Progress Note  03/04/2019 10:42 AM Grace Grant  MRN:  [id number] Interview was conducted using WebEx teleconferencing application and I verified that I was speaking with the correct person using two identifiers. I discussed the limitations of evaluation and management by telemedicine and  the availability of in person appointments. Patient expressed understanding and agreed to proceed.  Chief Complaint: "I am doing better and sleeping better".  HPI: 29 yo single white female who has recently been dmitted to [facility] (10/25-27/20) after intentional OD on several meciations with suicidal intent. She then started PHP at 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 She continues to struggle with insomnia - previously failed trazodone (one of meds she OD on) and recently zolpidem 10 mg. It helped her fall asleep but she was still waking up. She reports being diagnosed with having GAD and OCD in the past. She was discharged on 120 mg of Cymbalta from [facility] but was only taking 60 mg. Hydroxyzine was prescribed for anxiety/sleep but was ineffective. Prazosin for nightmares was added but she did not take it thinking it is for blood pressure. She gradually discontinue buspirone which she has been taking for over a year. This was her first psychiatric admission but she admits having one previous suicidal attempt by OD in 2018. Several of her symptoms are related to unexpected death of her fiancee in 2022/10/07 this year. She says she has been dx with PTSD because of that (she found him dead) but it appears to be more of an acute stress disorder reaction. Deaven additionally has been struggling with alcohol drinking problem - she has been in recovery for about one year (attended AA) but has had few relapses recently including the day she OD. She admits to craving alcohol. She has an individual  therapist.We have increased Cymbalta to 90 mg restarted prazosin 2 mg at HS and changed Ambien to CR. She noticed that her mood started to deteriorate and sleep has not improved. We the stopped Cymbalta a week ago and restarted Lexapro (now on 20 mg) which she has been on before with good response. She again reports marked improvement in her mood. We added Lunesta but she did not see much benefit from it and stopped it. Over past week she has not taken any sleep aids and her sleep has been fairly normal. She also stopped taking prazosin bacause of dizziness and low blood pressure. Nightmares and flashbacks stopped. She remains sober and has not started to use naltrexone.  Visit Diagnosis:    ICD-10-CM   1. GAD (generalized anxiety disorder)  F41.1   2. Alcohol use disorder, moderate, in early remission (HCC)  F10.21   3. Social anxiety disorder  F40.10   4. Major depressive disorder, recurrent episode, mild (HCC)  F33.0     Past Psychiatric History: Please see intake H&P>  Past Medical History:  Past Medical History:  Diagnosis Date  . Anxiety   . Bradycardia   . Depression     Past Surgical History:  Procedure Laterality Date  . WISDOM TOOTH EXTRACTION      Family Psychiatric History: Reviewed.  Family History:  Family History  Problem Relation Age of Onset  . Depression Mother   . Alcohol abuse Father   . Alcohol abuse Cousin     Social History:  Social History   Socioeconomic History  . Marital status:  Single    Spouse name: Not on file  . Number of children: Not on file  . Years of education: Not on file  . Highest education level: Not on file  Occupational History  . Not on file  Tobacco Use  . Smoking status: Never Smoker  . Smokeless tobacco: Never Used  Substance and Sexual Activity  . Alcohol use: Yes    Comment: occ  . Drug use: Never  . Sexual activity: Yes    Birth control/protection: Pill  Other Topics Concern  . Not on file  Social History  Narrative  . Not on file   Social Determinants of Health   Financial Resource Strain: Low Risk   . Difficulty of Paying Living Expenses: Not hard at all  Food Insecurity: No Food Insecurity  . Worried About Charity fundraiser in the Last Year: Never true  . Ran Out of Food in the Last Year: Never true  Transportation Needs: No Transportation Needs  . Lack of Transportation (Medical): No  . Lack of Transportation (Non-Medical): No  Physical Activity: Unknown  . Days of Exercise per Week: 0 days  . Minutes of Exercise per Session: Not on file  Stress: Stress Concern Present  . Feeling of Stress : Very much  Social Connections: Unknown  . Frequency of Communication with Friends and Family: More than three times a week  . Frequency of Social Gatherings with Friends and Family: Never  . Attends Religious Services: Never  . Active Member of Clubs or Organizations: No  . Attends Archivist Meetings: Never  . Marital Status: Not on file    Allergies: No Known Allergies  Metabolic Disorder Labs: No results found for: HGBA1C, MPG No results found for: PROLACTIN No results found for: CHOL, TRIG, HDL, CHOLHDL, VLDL, LDLCALC No results found for: TSH  Therapeutic Level Labs: No results found for: LITHIUM No results found for: VALPROATE No components found for:  CBMZ  Current Medications: Current Outpatient Medications  Medication Sig Dispense Refill  . escitalopram (LEXAPRO) 20 MG tablet Take 1 tablet (20 mg total) by mouth daily. 90 tablet 1  . hydrOXYzine (ATARAX/VISTARIL) 25 MG tablet Take 1 tablet (25 mg total) by mouth 2 (two) times daily as needed for anxiety. 30 tablet 2  . pantoprazole (PROTONIX) 40 MG tablet Take 1 tablet (40 mg total) by mouth daily. (Patient not taking: Reported on 11/09/2018) 90 tablet 1  . Prenat Vit-Fe Gly Cys-FA-Omega (ENBRACE HR) CAPS Take 1 capsule by mouth daily. May get from [email or web address] (Patient not taking: Reported on 11/09/2018) 90  capsule 2   No current facility-administered medications for this visit.     Psychiatric Specialty Exam: Review of Systems  Psychiatric/Behavioral: The patient is nervous/anxious.   All other systems reviewed and are negative.   There were no vitals taken for this visit.There is no height or weight on file to calculate BMI.  General Appearance: Casual and Fairly Groomed  Eye Contact:  Good  Speech:  Clear and Coherent and Normal Rate  Volume:  Normal  Mood:  Anxious and Depressed  Affect:  Constricted  Thought Process:  Goal Directed and Linear  Orientation:  Full (Time, Place, and Person)  Thought Content: Logical   Suicidal Thoughts:  No  Homicidal Thoughts:  No  Memory:  Immediate;   Good Recent;   Good Remote;   Good  Judgement:  Good  Insight:  Good  Psychomotor Activity:  Normal  Concentration:  Concentration: Good  Recall:  Good  Fund of Knowledge: Good  Language: Good  Akathisia:  Negative  Handed:  Right  AIMS (if indicated): not done  Assets:  Communication Skills Desire for Improvement Financial Resources/Insurance Housing Resilience Talents/Skills  ADL's:  Intact  Cognition: WNL  Sleep:  Fair   Screenings: AIMS     [facility]  AIMS Total Score  0    PHQ2-9     Counselor from 11/15/2018 in [facility]AL HEALTH PART[facility]OSPITALIZATION PROGRAM Counselor from 11/09/2018 in BEH[facility]IORAL [facility]ALTH PARTIAL HOSPITALIZATION PROGRAM  PHQ-2 Total Score  1  6  PHQ-9 Total Score  10  19       Assessment and Plan: 29 yo single white female who has been admitted to [facility] (10/25-27/20) after intentional OD on several medications with suicidal intent. 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 She continues to struggle with insomnia - previously failed trazodone (one of  meds she OD on) and recently zolpidem 10 mg. It helped her fall asleep but she was still waking up. She reports being diagnosed with having GAD and OCD in the past. She was discharged on 120 mg of Cymbalta from [facility] but was only taking 60 mg. Hydroxyzine was prescribed for anxiety/sleep but was ineffective. Prazosin for nightmares was added but she did not take it thinking it is for blood pressure. She gradually discontinue buspirone which she has been taking for over a year. This was her first psychiatric admission but she admits having one previous suicidal attempt by OD in 2018. Several of her symptoms are related to unexpected death of her fiancee in 10/10/2022 this year. She says she has been dx with PTSD because of that (she found him dead) but it appears to be more of an acute stress disorder reaction. Grace Grant additionally has been struggling with alcohol drinking problem - she has been in recovery for about one year (attended AA) but has had few relapses recently including the day she OD. She admits to craving alcohol. She has an individual therapist.We have increased Cymbalta to 90 mg restarted prazosin 2 mg at HS and changed Ambien to CR. She noticed that her mood started to deteriorate and sleep has not improved. We the stopped Cymbalta a week ago and restarted Lexapro (now on 20 mg) which she has been on before with good response. She again reports marked improvement in her mood. We added Lunesta but she did not see much benefit from it and stopped it. Over past week she has not taken any sleep aids and her sleep has been fairly normal. She also stopped taking prazosin bacause of dizziness and low blood pressure. Nightmares and flashbacks stopped. She remains sober and has not started to use naltrexone.  Dx: MDD recurrent mild; GAD/Social anxiety disorder; Alcohol use disorder in early remission  Plan: Continue Laxapro 20 mg and hydroxyzine 25 mg prn anxiety (she does not use it more often than  1-2 x per week. Continue individual therapy. Next appointment in three months.The plan was discussed with patient who had an opportunity to ask questions and these were all answered. I spend62minutes in videoconferencingwith the patient.     Magdalene Patricia, MD 03/04/2019, 10:42 AM

## 2019-03-09 ENCOUNTER — Ambulatory Visit (INDEPENDENT_AMBULATORY_CARE_PROVIDER_SITE_OTHER): Payer: 59 | Admitting: Cardiology

## 2019-03-09 ENCOUNTER — Encounter: Payer: Self-pay | Admitting: Cardiology

## 2019-03-09 ENCOUNTER — Other Ambulatory Visit: Payer: Self-pay

## 2019-03-09 VITALS — BP 110/70 | HR 53 | Ht 66.0 in | Wt 127.0 lb

## 2019-03-09 DIAGNOSIS — I951 Orthostatic hypotension: Secondary | ICD-10-CM | POA: Diagnosis not present

## 2019-03-09 DIAGNOSIS — R42 Dizziness and giddiness: Secondary | ICD-10-CM

## 2019-03-09 MED ORDER — MIDODRINE HCL 5 MG PO TABS: 5.0000 mg | ORAL_TABLET | Freq: Three times a day (TID) | ORAL | 6 refills | Status: AC

## 2019-03-09 NOTE — Patient Instructions (Signed)
Medication Instructions:  Please take Midodrine 5 mg three times a day. Continue all other medications as listed.  *If you need a refill on your cardiac medications before your next appointment, please call your pharmacy*  Testing/Procedures: Your physician has requested that you have an echocardiogram. Echocardiography is a painless test that uses sound waves to create images of your heart. It provides your doctor with information about the size and shape of your heart and how well your heart's chambers and valves are working. This procedure takes approximately one hour. There are no restrictions for this procedure.  Follow-Up: At [facility], you and your health needs are our priority.  As part of our continuing mission to provide you with exceptional heart care, we have created designated Provider Care Teams.  These Care Teams include your primary Cardiologist (physician) and Advanced Practice Providers (APPs -  Physician Assistants and Nurse Practitioners) who all work together to provide you with the care you need, when you need it.  We recommend signing up for the patient portal called "MyChart".  Sign up information is provided on this After Visit Summary.  MyChart is used to connect with patients for Virtual Visits (Telemedicine).  Patients are able to view lab/test results, encounter notes, upcoming appointments, etc.  Non-urgent messages can be sent to your provider as well.   To learn more about what you can do with MyChart, go to [email or web address].    Your next appointment:   4 week(s)  The format for your next appointment:   In Person  Provider:   Donato Schultz, MD  Thank you for choosing [facility]!!

## 2019-03-09 NOTE — Progress Notes (Signed)
Cardiology Office Note:    Date:  03/09/2019   ID:  Grace Grant, DOB 1990-10-06, MRN [id number]  PCP:  Sigmund Hazel, MD  Cardiologist:  No primary care provider on file.  Electrophysiologist:  None   Referring MD: Sigmund Hazel, MD     History of Present Illness:    Grace Grant is a 29 y.o. female here for the evaluation of dizziness at the request of Sigmund Hazel, MD.  She said intermittent dizziness over the past few months wonders if medication changes were the culprit.  Mainly occur in the afternoon of the evening.  Went away for a while but then returned.  Became more persistent.  May feel better with a pressure overhead.  Feels sometimes like she might faint.  Sits down it goes away.  She was on prazosin lowered from 2 mg to 1 mg.  Blood pressure sitting at Dr. Rondel Baton office was 88/66.  The prazosin was stopped.  Unfortunately she has some PTSD after her fianc died.  However before all of this, she did have issues with dizziness and low blood pressure especially in the afternoons.  No significant syncopal episodes.  No chest pain.  Minimal caffeine use.  No other significant supplement use.  She has been struggling with her anxieties, hard to leave the house worried that she is going to faint.  Past Medical History:  Diagnosis Date  . Alcohol use disorder, moderate, in early remission (HCC) 12/01/2018  . Anxiety   . Bradycardia   . Depression   . GAD (generalized anxiety disorder) 12/01/2018  . Hypokalemia 10/28/2018  . Major depressive disorder, recurrent episode, mild (HCC) 10/29/2018  . MDD (major depressive disorder) 10/31/2018  . OD (overdose of drug) 10/28/2018  . Posttraumatic stress disorder 11/08/2018  . Social anxiety disorder 12/01/2018  . Suicidal behavior 10/31/2018  . Suicide attempt [facility])     Past Surgical History:  Procedure Laterality Date  . WISDOM TOOTH EXTRACTION      Current Medications: Current Meds  Medication Sig  .  CAMRESE LO 0.1-0.02 & 0.01 MG tablet Take 1 tablet by mouth daily.  Marland Kitchen escitalopram (LEXAPRO) 20 MG tablet Take 1 tablet (20 mg total) by mouth daily.  . hydrOXYzine (ATARAX/VISTARIL) 25 MG tablet Take 1 tablet (25 mg total) by mouth 2 (two) times daily as needed for anxiety.  . [DISCONTINUED] pantoprazole (PROTONIX) 40 MG tablet Take 1 tablet (40 mg total) by mouth daily.  . [DISCONTINUED] Prenat Vit-Fe Gly Cys-FA-Omega (ENBRACE HR) CAPS Take 1 capsule by mouth daily. May get from [email or web address]     Allergies:   Patient has no known allergies.   Social History   Socioeconomic History  . Marital status: Single    Spouse name: Not on file  . Number of children: Not on file  . Years of education: Not on file  . Highest education level: Not on file  Occupational History  . Not on file  Tobacco Use  . Smoking status: Never Smoker  . Smokeless tobacco: Never Used  Substance and Sexual Activity  . Alcohol use: Yes    Comment: occ  . Drug use: Never  . Sexual activity: Yes    Birth control/protection: Pill  Other Topics Concern  . Not on file  Social History Narrative  . Not on file   Social Determinants of Health   Financial Resource Strain: Low Risk   . Difficulty of Paying Living Expenses: Not hard at all  Food Insecurity: No Food Insecurity  .  Worried About Charity fundraiser in the Last Year: Never true  . Ran Out of Food in the Last Year: Never true  Transportation Needs: No Transportation Needs  . Lack of Transportation (Medical): No  . Lack of Transportation (Non-Medical): No  Physical Activity: Unknown  . Days of Exercise per Week: 0 days  . Minutes of Exercise per Session: Not on file  Stress: Stress Concern Present  . Feeling of Stress : Very much  Social Connections: Unknown  . Frequency of Communication with Friends and Family: More than three times a week  . Frequency of Social Gatherings with Friends and Family: Never  . Attends Religious Services: Never  .  Active Member of Clubs or Organizations: No  . Attends Archivist Meetings: Never  . Marital Status: Not on file     Family History: The patient's family history includes Alcohol abuse in her cousin and father; Depression in her mother.  ROS:   Please see the history of present illness.     All other systems reviewed and are negative.  EKGs/Labs/Other Studies Reviewed:    The following studies were reviewed today: Lab work from the hospital was unremarkable. Office note, Dr. Sabra Heck reviewed  EKG:  EKG is  ordered today.  The ekg ordered today demonstrates sinus bradycardia 53 with no other abnormalities normal intervals.  Recent Labs: 10/30/2018: ALT 10; BUN 5; Creatinine, Ser 0.70; Hemoglobin 13.1; Magnesium 1.9; Platelets 176; Potassium 4.1; Sodium 138  Recent Lipid Panel No results found for: CHOL, TRIG, HDL, CHOLHDL, VLDL, LDLCALC, LDLDIRECT  Physical Exam:    VS:  BP 110/70   Pulse (!) 53   Ht 5\' 6"  (1.676 m)   Wt 127 lb (57.6 kg)   SpO2 96%   BMI 20.50 kg/m     Wt Readings from Last 3 Encounters:  03/09/19 127 lb (57.6 kg)  10/28/18 120 lb (54.4 kg)  09/17/17 145 lb (65.8 kg)     GEN:  Well nourished, well developed in no acute distress HEENT: Normal NECK: No JVD; No carotid bruits LYMPHATICS: No lymphadenopathy CARDIAC: RRR, no murmurs, rubs, gallops RESPIRATORY:  Clear to auscultation without rales, wheezing or rhonchi  ABDOMEN: Soft, non-tender, non-distended MUSCULOSKELETAL:  No edema; No deformity  SKIN: Warm and dry NEUROLOGIC:  Alert and oriented x 3 PSYCHIATRIC:  Normal affect   ASSESSMENT:    1. Orthostatic hypotension   2. Dizziness    PLAN:    In order of problems listed above:  Hypotension/dizziness -Today, blood pressure improved 110/70, still feeling some dizziness however.  Blood pressure previously was 88 systolic.  Prazosin was stopped which was being used for nightmares. -I will check an echocardiogram to ensure proper  structure and function. -She is already been liberalizing her salt intake, trying to drink a sports drink, more fluids.  She is still having some issues with hypotension.  We will go ahead and try midodrine 5 mg 3 times a day.  She also knows about support stockings. -Daily exercise possible.  Certainly anxieties can exacerbate the symptoms.  Bradycardia -Likely increased vagal tone.  Benign.  Reviewed prior EKGs.    Medication Adjustments/Labs and Tests Ordered: Current medicines are reviewed at length with the patient today.  Concerns regarding medicines are outlined above.  Orders Placed This Encounter  Procedures  . EKG 12-Lead  . ECHOCARDIOGRAM COMPLETE   Meds ordered this encounter  Medications  . midodrine (PROAMATINE) 5 MG tablet    Sig: Take 1 tablet (  5 mg total) by mouth 3 (three) times daily with meals.    Dispense:  90 tablet    Refill:  6    Patient Instructions  Medication Instructions:  Please take Midodrine 5 mg three times a day. Continue all other medications as listed.  *If you need a refill on your cardiac medications before your next appointment, please call your pharmacy*  Testing/Procedures: Your physician has requested that you have an echocardiogram. Echocardiography is a painless test that uses sound waves to create images of your heart. It provides your doctor with information about the size and shape of your heart and how well your heart's chambers and valves are working. This procedure takes approximately one hour. There are no restrictions for this procedure.  Follow-Up: At [facility], you and your health needs are our priority.  As part of our continuing mission to provide you with exceptional heart care, we have created designated Provider Care Teams.  These Care Teams include your primary Cardiologist (physician) and Advanced Practice Providers (APPs -  Physician Assistants and Nurse Practitioners) who all work together to provide you with the  care you need, when you need it.  We recommend signing up for the patient portal called "MyChart".  Sign up information is provided on this After Visit Summary.  MyChart is used to connect with patients for Virtual Visits (Telemedicine).  Patients are able to view lab/test results, encounter notes, upcoming appointments, etc.  Non-urgent messages can be sent to your provider as well.   To learn more about what you can do with MyChart, go to [email or web address].    Your next appointment:   4 week(s)  The format for your next appointment:   In Person  Provider:   Donato Schultz, MD  Thank you for choosing [facility]!!        Signed, Donato Schultz, MD  03/09/2019 4:57 PM    [facility]

## 2019-03-17 ENCOUNTER — Telehealth: Payer: Self-pay | Admitting: *Deleted

## 2019-03-17 NOTE — Telephone Encounter (Signed)
Jake Bathe, MD 20 minutes ago (11:29 AM)     Increase midodrine to 10 mg TID Keep drinking sports drink Salt  Donato Schultz, MD       Documentation   Sigurd Sos, RN  You; Jake Bathe, MD 2 days ago   I spoke to the patient, who feels better this morning, but usually by afternoon, she is extremely dizzy. She is taking the Midodrine 5 mg tid, recently started and BP yesterday was 107/70 HR 50-60 in the afternoon. Please advise, thank you   Message text   Grace Grant, Grace Grant, Grace Fells, MD 3 days ago  KR I am still very dizzy.  The medicine seemed to be working 2-3 days in but today I am back to being insanely dizzy.  Like I could barely get off the couch.  I started off taking 2.5 mg 3 times a day per the instruction of your nurse but while it was kind of working, I was still dizzy so I decided to take the full 5 mg tablet.  I feel heavy and unstable and just overall pretty terrible.  Any advice?  I'm sick of feeling miserable!  I took my BP today and it was 107/70.  Please help!

## 2019-03-17 NOTE — Telephone Encounter (Signed)
Spoke with patient to f/u with how she is feeling.  She reports she first started taking midodrine 2.5 mg TID which didn't seem to help much so she increased to 5 mg TID as ordered.  BP she reports is "good" at 100/70.  Today she only took 2.5 mg of Midodrine.  She reports being dizzy from the time she gets up until she goes to bed.  She denies the sensation of the room spinning.  She denies any increase in dizziness with position changes.  She states nothing makes it worse and nothing makes it better.  She has not been taking hydroxyzine as ordered but has been taking dramamine.   She is asking if her blood levels could be "off" because she has not been eating well.  Advised it is possible if she is not eating and she should f/u with PCP for any possible blood work needed.  Advised pt to obtain "Liquid IV" to use daily and also of Dr Anne Fu orders to increase Midodrine 10 to mg TID.  She states she will continue 5 mg TID at this time and f/u with her PCP for possible lab.  She may also see about seeing an ENT.  She will c/b if no improvement.

## 2019-03-17 NOTE — Telephone Encounter (Signed)
Increase midodrine to 10 mg TID Keep drinking sports drink Salt  Donato Schultz, MD

## 2019-03-23 ENCOUNTER — Ambulatory Visit (HOSPITAL_COMMUNITY): Payer: 59 | Attending: Internal Medicine

## 2019-03-23 ENCOUNTER — Other Ambulatory Visit: Payer: Self-pay

## 2019-03-23 DIAGNOSIS — I951 Orthostatic hypotension: Secondary | ICD-10-CM | POA: Diagnosis not present

## 2019-03-23 DIAGNOSIS — R42 Dizziness and giddiness: Secondary | ICD-10-CM | POA: Insufficient documentation

## 2019-03-25 ENCOUNTER — Other Ambulatory Visit: Payer: Self-pay | Admitting: Physician Assistant

## 2019-03-25 DIAGNOSIS — R42 Dizziness and giddiness: Secondary | ICD-10-CM

## 2019-04-12 ENCOUNTER — Other Ambulatory Visit: Payer: Self-pay

## 2019-04-12 ENCOUNTER — Ambulatory Visit (INDEPENDENT_AMBULATORY_CARE_PROVIDER_SITE_OTHER): Payer: 59 | Admitting: Psychiatry

## 2019-04-12 DIAGNOSIS — F1021 Alcohol dependence, in remission: Secondary | ICD-10-CM

## 2019-04-12 DIAGNOSIS — F4310 Post-traumatic stress disorder, unspecified: Secondary | ICD-10-CM

## 2019-04-12 DIAGNOSIS — F4010 Social phobia, unspecified: Secondary | ICD-10-CM

## 2019-04-12 DIAGNOSIS — F411 Generalized anxiety disorder: Secondary | ICD-10-CM | POA: Diagnosis not present

## 2019-04-12 DIAGNOSIS — F330 Major depressive disorder, recurrent, mild: Secondary | ICD-10-CM

## 2019-04-12 MED ORDER — FLUOXETINE HCL 20 MG PO CAPS: ORAL_CAPSULE | ORAL | 0 refills | Status: DC

## 2019-04-12 MED ORDER — TRAZODONE HCL 150 MG PO TABS: 150.0000 mg | ORAL_TABLET | Freq: Every evening | ORAL | 2 refills | Status: DC | PRN

## 2019-04-12 MED ORDER — RISPERIDONE 0.5 MG PO TBDP: 0.5000 mg | ORAL_TABLET | Freq: Two times a day (BID) | ORAL | 1 refills | Status: DC | PRN

## 2019-04-12 MED ORDER — HYDROXYZINE HCL 50 MG PO TABS: 50.0000 mg | ORAL_TABLET | Freq: Two times a day (BID) | ORAL | 2 refills | Status: AC | PRN

## 2019-04-12 NOTE — Progress Notes (Signed)
BH MD/PA/NP OP Progress Note  04/12/2019 8:45 AM Grace Grant  MRN:  [id number] Interview was conducted by phone and I verified that I was speaking with the correct person using two identifiers. I discussed the limitations of evaluation and management by telemedicine and  the availability of in person appointments. Patient expressed understanding and agreed to proceed.  Chief Complaint: Anxiety, insomnia, derealization, weight gain  HPI: 29 yo single white female who has been admitted to [facility] (10/25-27/20) after intentional OD on several medications with suicidal intent. 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 She continues to struggle with insomnia - previously failed trazodone (one of meds she OD on) and recently zolpidem 10 mg. It helped her fall asleep but she was still waking up. She reports being diagnosed with having GAD and OCD in the past. She was discharged on 120 mg of Cymbalta from [facility] but was only taking 60 mg. Hydroxyzine was prescribed for anxiety/sleep but was ineffective. Prazosin for nightmares was added but she did not take it thinking it is for blood pressure. She gradually discontinue buspirone which she has been taking for over a year. This was her first psychiatric admission but she admits having one previous suicidal attempt by OD in 2018. Several of her symptoms are related to unexpected death of her fiancee in October 06, 2022 this year. She says she has been dx with PTSD because of that (she found him dead). Grace Grant additionally has been struggling with alcohol drinking problem - she has been in recovery for about one year (attended AA) but has had few relapses recently including the day she OD. She admits to craving alcohol. She has an individual therapist.We have increased Cymbalta to 90 mg restarted prazosin 2 mg at HS and changed Ambien to  CR.She noticed that her mood started to deteriorate and sleep has not improved. We then stopped Cymbalta and restarted Lexapro (now on 20 mg) which she has been on before with good response. She again reports marked improvement in her mood. We added Lunesta but she did not see much benefit from it and stopped it. Over past week she has not taken any sleep aids and her sleep has been fairly normal. She also stopped taking prazosin bacause of dizziness and low blood pressure. Nightmares and flashbacks stopped but she recently (over past 1-2 weeks) again started to have insomnia, anxiety increased and she has been experiencing periods of derealization. She resumed taking trazodone 75 mg with good effect. She also noticed weight gain (10 lbs) which was previously observed with Lexapro. She used to be on Prozac w/o that problem in the past.   Visit Diagnosis:    ICD-10-CM   1. GAD (generalized anxiety disorder)  F41.1   2. Posttraumatic stress disorder  F43.10   3. Social anxiety disorder  F40.10   4. Alcohol use disorder, moderate, in early remission (HCC)  F10.21   5. Major depressive disorder, recurrent episode, mild (HCC)  F33.0     Past Psychiatric History: Please see intake H&P.  Past Medical History:  Past Medical History:  Diagnosis Date  . Alcohol use disorder, moderate, in early remission (HCC) 12/01/2018  . Anxiety   . Bradycardia   . Depression   . GAD (generalized anxiety disorder) 12/01/2018  . Hypokalemia 10/28/2018  . Major depressive disorder, recurrent episode, mild (HCC) 10/29/2018  . MDD (  major depressive disorder) 10/31/2018  . OD (overdose of drug) 10/28/2018  . Posttraumatic stress disorder 11/08/2018  . Social anxiety disorder 12/01/2018  . Suicidal behavior 10/31/2018  . Suicide attempt [facility])     Past Surgical History:  Procedure Laterality Date  . WISDOM TOOTH EXTRACTION      Family Psychiatric History: Reviewed.  Family History:  Family History  Problem  Relation Age of Onset  . Depression Mother   . Alcohol abuse Father   . Alcohol abuse Cousin     Social History:  Social History   Socioeconomic History  . Marital status: Single    Spouse name: Not on file  . Number of children: Not on file  . Years of education: Not on file  . Highest education level: Not on file  Occupational History  . Not on file  Tobacco Use  . Smoking status: Never Smoker  . Smokeless tobacco: Never Used  Substance and Sexual Activity  . Alcohol use: Yes    Comment: occ  . Drug use: Never  . Sexual activity: Yes    Birth control/protection: Pill  Other Topics Concern  . Not on file  Social History Narrative  . Not on file   Social Determinants of Health   Financial Resource Strain: Low Risk   . Difficulty of Paying Living Expenses: Not hard at all  Food Insecurity: No Food Insecurity  . Worried About Programme researcher, broadcasting/film/video in the Last Year: Never true  . Ran Out of Food in the Last Year: Never true  Transportation Needs: No Transportation Needs  . Lack of Transportation (Medical): No  . Lack of Transportation (Non-Medical): No  Physical Activity: Unknown  . Days of Exercise per Week: 0 days  . Minutes of Exercise per Session: Not on file  Stress: Stress Concern Present  . Feeling of Stress : Very much  Social Connections: Unknown  . Frequency of Communication with Friends and Family: More than three times a week  . Frequency of Social Gatherings with Friends and Family: Never  . Attends Religious Services: Never  . Active Member of Clubs or Organizations: No  . Attends Banker Meetings: Never  . Marital Status: Not on file    Allergies: No Known Allergies  Metabolic Disorder Labs: No results found for: HGBA1C, MPG No results found for: PROLACTIN No results found for: CHOL, TRIG, HDL, CHOLHDL, VLDL, LDLCALC No results found for: TSH  Therapeutic Level Labs: No results found for: LITHIUM No results found for:  VALPROATE No components found for:  CBMZ  Current Medications: Current Outpatient Medications  Medication Sig Dispense Refill  . CAMRESE LO 0.1-0.02 & 0.01 MG tablet Take 1 tablet by mouth daily.    Marland Kitchen FLUoxetine (PROZAC) 20 MG capsule Take 1 capsule (20 mg total) by mouth daily for 7 days, THEN 2 capsules (40 mg total) daily. 127 capsule 0  . hydrOXYzine (ATARAX/VISTARIL) 50 MG tablet Take 1 tablet (50 mg total) by mouth 2 (two) times daily as needed for anxiety. 30 tablet 2  . midodrine (PROAMATINE) 5 MG tablet Take 1 tablet (5 mg total) by mouth 3 (three) times daily with meals. 90 tablet 6  . risperiDONE (RISPERDAL M-TABS) 0.5 MG disintegrating tablet Take 1 tablet (0.5 mg total) by mouth 2 (two) times daily as needed (anxiety/hallucinations). 60 tablet 1  . traZODone (DESYREL) 150 MG tablet Take 1 tablet (150 mg total) by mouth at bedtime as needed for sleep. 30 tablet 2  No current facility-administered medications for this visit.     Psychiatric Specialty Exam: Review of Systems  Neurological: Positive for dizziness.  Psychiatric/Behavioral: Positive for sleep disturbance. The patient is nervous/anxious.   All other systems reviewed and are negative.   There were no vitals taken for this visit.There is no height or weight on file to calculate BMI.  General Appearance: NA  Eye Contact:  NA  Speech:  Clear and Coherent and Normal Rate  Volume:  Normal  Mood:  Anxious  Affect:  NA  Thought Process:  Goal Directed and Linear  Orientation:  Full (Time, Place, and Person)  Thought Content: Logical and Ilusions   Suicidal Thoughts:  No  Homicidal Thoughts:  No  Memory:  Immediate;   Good Recent;   Good Remote;   Good  Judgement:  Good  Insight:  Good  Psychomotor Activity:  NA  Concentration:  Concentration: Good  Recall:  Good  Fund of Knowledge: Good  Language: Good  Akathisia:  Negative  Handed:  Right  AIMS (if indicated): not done  Assets:  Communication  Skills Desire for Improvement Financial Resources/Insurance Housing Resilience Social Support  ADL's:  Intact  Cognition: WNL  Sleep:  Fair   Screenings: AIMS     [facility]  AIMS Total Score  0    PHQ2-9     Counselor from 11/15/2018 in [facility] Counselor from 11/09/2018 in [facility]  PHQ-2 Total Score  1  6  PHQ-9 Total Score  10  19       Assessment and Plan:  29 yo single white female who has been admitted to [facility] (10/25-27/20) after intentional OD on several medications with suicidal intent. 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 She continues to struggle with insomnia - previously failed trazodone (one of meds she OD on) and recently zolpidem 10 mg. It helped her fall asleep but she was still waking up. She reports being diagnosed with having GAD and OCD in the past. She was discharged on 120 mg of Cymbalta from [facility] but was only taking 60 mg. Hydroxyzine was prescribed for anxiety/sleep but was ineffective. Prazosin for nightmares was added but she did not take it thinking it is for blood pressure. She gradually discontinue buspirone which she has been taking for over a year. This was her first psychiatric admission but she admits having one previous suicidal attempt by OD in 2018. Several of her symptoms are related to unexpected death of her fiancee in 09/26/22 this year. She says she has been dx with PTSD because of that (she found him dead). Emaree additionally has been struggling with alcohol drinking problem - she has been in recovery for about one year (attended AA) but has had few relapses recently including the day she OD. She admits to craving alcohol. She has an individual therapist.We  have increased Cymbalta to 90 mg restarted prazosin 2 mg at HS and changed Ambien to CR.She noticed that her mood started to deteriorate and sleep has not improved. We then stopped Cymbalta and restarted Lexapro (now on 20 mg) which she has been on before with good response. She again reports marked improvement in her mood. We added Lunesta but she did not see much benefit from it and stopped  it. Over past week she has not taken any sleep aids and her sleep has been fairly normal. She also stopped taking prazosin bacause of dizziness and low blood pressure. Nightmares and flashbacks stopped but she recently (over past 1-2 weeks) again started to have insomnia, anxiety increased and she has been experiencing periods of derealization. She resumed taking trazodone 75 mg with good effect. She also noticed weight gain (10 lbs) which was previously observed with Lexapro. She used to be on Prozac w/o that problem in the past.   Dx: MDD recurrent mild; GAD/Social anxiety disorder; PTSD; Alcohol use disorder in early remission  Plan:Discontinue Laxapro 20 mg and start Prozac 20 mg x 1 week then increase to 40 mg. Increase hydroxyzine to 50 mg prn anxiety (she does not use it more often than 1-2 x per week. Resume trazodone prn insomnia. I will also add risperidone M tabs 0.5 mg bid prn anxiety and dissociative symptoms. Continue individual therapy. Next appointment in two months.The plan was discussed with patient who had an opportunity to ask questions and these were all answered. I spend65minutes in phone consultation with the patient.   Stephanie Acre, MD 04/12/2019, 8:45 AM

## 2019-04-13 ENCOUNTER — Ambulatory Visit: Payer: 59 | Admitting: Cardiology

## 2019-04-23 ENCOUNTER — Other Ambulatory Visit: Payer: 59

## 2019-04-26 ENCOUNTER — Encounter (HOSPITAL_COMMUNITY): Payer: Self-pay | Admitting: Emergency Medicine

## 2019-04-26 ENCOUNTER — Other Ambulatory Visit: Payer: Self-pay

## 2019-04-26 ENCOUNTER — Emergency Department (HOSPITAL_COMMUNITY)
Admission: EM | Admit: 2019-04-26 | Discharge: 2019-04-27 | Disposition: A | Discharge status: Home / Self Care | Payer: 59 | Attending: Emergency Medicine | Admitting: Emergency Medicine

## 2019-04-26 DIAGNOSIS — Z79899 Other long term (current) drug therapy: Secondary | ICD-10-CM | POA: Insufficient documentation

## 2019-04-26 DIAGNOSIS — R42 Dizziness and giddiness: Secondary | ICD-10-CM | POA: Insufficient documentation

## 2019-04-26 DIAGNOSIS — R110 Nausea: Secondary | ICD-10-CM | POA: Diagnosis not present

## 2019-04-26 MED ORDER — DIAZEPAM 5 MG PO TABS
5.0000 mg | ORAL_TABLET | Freq: Once | ORAL | Status: DC
  Filled 2019-04-26: qty 1

## 2019-04-26 MED ORDER — MECLIZINE HCL 25 MG PO TABS
25.0000 mg | ORAL_TABLET | Freq: Once | ORAL | Status: AC
  Administered 2019-04-26: 25 mg via ORAL
  Filled 2019-04-26: qty 1

## 2019-04-26 NOTE — ED Provider Notes (Signed)
[facility]-EMERGENCY DEPT Provider Note   CSN: [id number] Arrival date & time: 04/26/19  2225     History Chief Complaint  Patient presents with  . Dizziness    Grace Grant is a 29 y.o. female.  HPI     This is a 29 year old female with a history of anxiety, suicidality, depression who presents with dizziness.  Patient reports that she has had 4 to 5 months of dizziness.  Initially it felt like "I was in a boat rocking back and forth."  She states that it is generally worsened and now she feels like she is about to fall over.  She is not noted any gait disturbance.  She denies room spinning.  She has seen an ENT and was set up to see a neurologist and had an outpatient MRI which has not yet been done.  She is not had any fevers.  She reports nausea without vomiting.  She does report she had an overdose in October after her fianc committed suicide and thinks it may have "messed with my brain."  She reports having normal lab work-up within the last month by primary physician.  She denies any chest pain, shortness of breath, fevers, abdominal pain.  Past Medical History:  Diagnosis Date  . Alcohol use disorder, moderate, in early remission (HCC) 12/01/2018  . Anxiety   . Bradycardia   . Depression   . GAD (generalized anxiety disorder) 12/01/2018  . Hypokalemia 10/28/2018  . Major depressive disorder, recurrent episode, mild (HCC) 10/29/2018  . MDD (major depressive disorder) 10/31/2018  . OD (overdose of drug) 10/28/2018  . Posttraumatic stress disorder 11/08/2018  . Social anxiety disorder 12/01/2018  . Suicidal behavior 10/31/2018  . Suicide attempt [facility]     Patient Active Problem List   Diagnosis Date Noted  . GAD (generalized anxiety disorder) 12/01/2018  . Social anxiety disorder 12/01/2018  . Alcohol use disorder, moderate, in early remission (HCC) 12/01/2018  . Posttraumatic stress disorder 11/08/2018  . MDD (major depressive disorder)  10/31/2018  . Suicidal behavior 10/31/2018  . Major depressive disorder, recurrent episode, mild (HCC) 10/29/2018  . OD (overdose of drug) 10/28/2018  . Hypokalemia 10/28/2018  . Anxiety 10/28/2018  . Depression 10/28/2018  . Suicide attempt [facility])     Past Surgical History:  Procedure Laterality Date  . WISDOM TOOTH EXTRACTION       OB History    Gravida      Para      Term      Preterm      AB      Living  0     SAB      TAB      Ectopic      Multiple      Live Births              Family History  Problem Relation Age of Onset  . Depression Mother   . Alcohol abuse Father   . Alcohol abuse Cousin     Social History   Tobacco Use  . Smoking status: Never Smoker  . Smokeless tobacco: Never Used  Substance Use Topics  . Alcohol use: Yes    Comment: occ  . Drug use: Never    Home Medications Prior to Admission medications   Medication Sig Start Date End Date Taking? Authorizing Provider  CAMRESE LO 0.1-0.02 & 0.01 MG tablet Take 1 tablet by mouth daily. 02/01/19  Yes [provider]  FLUoxetine (PROZAC) 20  MG capsule Take 1 capsule (20 mg total) by mouth daily for 7 days, THEN 2 capsules (40 mg total) daily. Patient taking differently: 40 mg daily 04/12/19 06/18/19 Yes Pucilowski, Olgierd A, MD  hydrOXYzine (ATARAX/VISTARIL) 50 MG tablet Take 1 tablet (50 mg total) by mouth 2 (two) times daily as needed for anxiety. 04/12/19 07/11/19 Yes Pucilowski, Olgierd A, MD  loratadine (CLARITIN) 10 MG tablet Take 10 mg by mouth daily.   Yes [provider]  risperiDONE (RISPERDAL M-TABS) 0.5 MG disintegrating tablet Take 1 tablet (0.5 mg total) by mouth 2 (two) times daily as needed (anxiety/hallucinations). 04/12/19 06/11/19 Yes Pucilowski, Olgierd A, MD  traZODone (DESYREL) 150 MG tablet Take 1 tablet (150 mg total) by mouth at bedtime as needed for sleep. 04/12/19 07/11/19 Yes Pucilowski, Marchia Bond, MD  meclizine (ANTIVERT) 25 MG tablet Take 1 tablet (25  mg total) by mouth 3 (three) times daily as needed for dizziness. 04/27/19   Sundy Houchins, Barbette Hair, MD  midodrine (PROAMATINE) 5 MG tablet Take 1 tablet (5 mg total) by mouth 3 (three) times daily with meals. Patient not taking: Reported on 04/26/2019 03/09/19   Jerline Pain, MD    Allergies    Patient has no known allergies.  Review of Systems   Review of Systems  Constitutional: Negative for fever.  Respiratory: Negative for shortness of breath.   Cardiovascular: Negative for chest pain.  Gastrointestinal: Positive for nausea. Negative for abdominal pain and vomiting.  Genitourinary: Negative for dysuria.  Neurological: Positive for dizziness. Negative for headaches.  All other systems reviewed and are negative.   Physical Exam Updated Vital Signs BP 112/77 (BP Location: Left Arm)   Pulse (!) 58   Temp 98.6 F (37 C) (Oral)   Resp 16   Ht 1.676 m (5\' 6" )   Wt 61.2 kg   SpO2 98%   BMI 21.79 kg/m   Physical Exam Vitals and nursing note reviewed.  Constitutional:      Appearance: She is well-developed.     Comments: Anxious appearing but nontoxic  HENT:     Head: Normocephalic and atraumatic.     Right Ear: Tympanic membrane normal.     Left Ear: Tympanic membrane normal.     Nose: Nose normal.     Mouth/Throat:     Mouth: Mucous membranes are moist.  Eyes:     Extraocular Movements: Extraocular movements intact.     Pupils: Pupils are equal, round, and reactive to light.  Cardiovascular:     Rate and Rhythm: Normal rate and regular rhythm.     Heart sounds: Normal heart sounds.  Pulmonary:     Effort: Pulmonary effort is normal. No respiratory distress.     Breath sounds: No wheezing.  Abdominal:     General: Bowel sounds are normal.     Palpations: Abdomen is soft.     Tenderness: There is no abdominal tenderness.  Musculoskeletal:     Cervical back: Neck supple.     Right lower leg: No edema.     Left lower leg: No edema.  Skin:    General: Skin is warm and  dry.  Neurological:     Mental Status: She is alert and oriented to person, place, and time.     Comments: Cranial nerves II through XII intact, 5 out of 5 strength in all 4 extremities, no dysmetria to finger-nose-finger, no drift  Psychiatric:     Comments: Anxious     ED Results / Procedures /  Treatments   Labs (all labs ordered are listed, but only abnormal results are displayed) Labs Reviewed - No data to display  EKG None  Radiology No results found.  Procedures Procedures (including critical care time)  Medications Ordered in ED Medications  diazepam (VALIUM) tablet 5 mg (5 mg Oral Refused 04/26/19 2349)  meclizine (ANTIVERT) tablet 25 mg (25 mg Oral Given 04/26/19 2349)    ED Course  I have reviewed the triage vital signs and the nursing notes.  Pertinent labs & imaging results that were available during my care of the patient were reviewed by me and considered in my medical decision making (see chart for details).    MDM Rules/Calculators/A&P                      Patient presents with dizziness.  This is acute on chronic and has been ongoing for several months.  She reports having outpatient ENT evaluation as well as primary care evaluation and is set up for an MRI as an outpatient.  She states it has progressively gotten worse.  She is overall nontoxic and vital signs are reassuring.  Her neurologic exam is normal without any evidence of cerebellar dysfunction.  She has normal gait.  Discussed with patient that given that this is a more chronic issue and she has a normal neurologic exam, have low suspicion at this time for stroke.  However, I do believe that an outpatient MRI is warranted.  I do not feel she needs one in the emergent setting.  Patient seems frustrated with this.  I have offered her lab work and symptom control.  Patient declines any lab work and states that she has had lab work by her primary physician.  She was ordered meclizine and Valium but refused  the Valium.  She states that she feels bad and does not want to take up to bed and would like to be discharged home.  Given that she has a normal exam, will discharge her with a prescription for meclizine and close follow-up as an outpatient.  After history, exam, and medical workup I feel the patient has been appropriately medically screened and is safe for discharge home. Pertinent diagnoses were discussed with the patient. Patient was given return precautions.    Final Clinical Impression(s) / ED Diagnoses Final diagnoses:  Dizziness    Rx / DC Orders ED Discharge Orders         Ordered    meclizine (ANTIVERT) 25 MG tablet  3 times daily PRN     04/27/19 0005           Shon Baton, MD 04/27/19 0007

## 2019-04-26 NOTE — ED Triage Notes (Addendum)
Patient states that she has been having dizzy spells x4 months. Patient states they've started to get worse and tonight patient states she was tired of it and came in to be seen. Patient states she feels like she is going to fall and that she feels like her "insides are being pulled." Patient states she has a history of anxiety and depression, no other medical history. Patient endorses nausea and head pressure, but no other symptoms. Patient has no neuro deficits.

## 2019-04-26 NOTE — ED Triage Notes (Signed)
Arrived by EMS patient reports dizziness since January that does not seem to be getting better. Patient says she has been r/o for having Vertigo, and wonders if the dizziness is associated with drug O/D in October and if it "messed her brain up".

## 2019-04-27 MED ORDER — MECLIZINE HCL 25 MG PO TABS: 25.0000 mg | ORAL_TABLET | Freq: Three times a day (TID) | ORAL | 0 refills | Status: AC | PRN

## 2019-04-27 NOTE — Discharge Instructions (Signed)
You were seen today for dizziness.  This has been ongoing for several months.  You should follow-up as an outpatient as scheduled with neurology and your primary physician.  Do agree that having an outpatient MRI is your next step.  If you develop any neurologic symptoms or strokelike symptoms, this is reason to be reseen.

## 2019-05-02 ENCOUNTER — Other Ambulatory Visit: Payer: Self-pay

## 2019-05-02 ENCOUNTER — Telehealth (HOSPITAL_COMMUNITY): Payer: Self-pay

## 2019-05-02 ENCOUNTER — Ambulatory Visit
Admission: RE | Admit: 2019-05-02 | Discharge: 2019-05-02 | Disposition: A | Discharge status: Home / Self Care | Payer: 59 | Source: Ambulatory Visit | Attending: Physician Assistant | Admitting: Physician Assistant

## 2019-05-02 ENCOUNTER — Other Ambulatory Visit (HOSPITAL_COMMUNITY): Payer: Self-pay | Admitting: Psychiatry

## 2019-05-02 DIAGNOSIS — R42 Dizziness and giddiness: Secondary | ICD-10-CM

## 2019-05-02 MED ORDER — FLUOXETINE HCL 20 MG PO CAPS: 60.0000 mg | ORAL_CAPSULE | Freq: Every day | ORAL | 0 refills | Status: DC

## 2019-05-02 NOTE — Telephone Encounter (Signed)
I changed her RX for fluoxetine to 60 mg (3 x 20 mg caps).

## 2019-05-02 NOTE — Telephone Encounter (Signed)
Pt called stating she would like to increase her Prozac. Pt states she has been on 40mg  for approximately two weeks and is still having depressive symptoms such as low mood and low energy. RN reinforced teaching that medication may take longer to have full effects. Pt states she is aware of this and would still like to increase her dose. Would like MD recommendations. Please advise.

## 2019-05-02 NOTE — Telephone Encounter (Signed)
Pt updated and aware of new RX

## 2019-05-16 ENCOUNTER — Other Ambulatory Visit: Payer: 59

## 2019-05-24 ENCOUNTER — Other Ambulatory Visit: Payer: 59

## 2019-06-14 ENCOUNTER — Other Ambulatory Visit: Payer: Self-pay

## 2019-06-14 ENCOUNTER — Telehealth (INDEPENDENT_AMBULATORY_CARE_PROVIDER_SITE_OTHER): Payer: 59 | Admitting: Psychiatry

## 2019-06-14 DIAGNOSIS — F1020 Alcohol dependence, uncomplicated: Secondary | ICD-10-CM | POA: Diagnosis not present

## 2019-06-14 DIAGNOSIS — F4010 Social phobia, unspecified: Secondary | ICD-10-CM

## 2019-06-14 DIAGNOSIS — F411 Generalized anxiety disorder: Secondary | ICD-10-CM | POA: Diagnosis not present

## 2019-06-14 DIAGNOSIS — F330 Major depressive disorder, recurrent, mild: Secondary | ICD-10-CM

## 2019-06-14 MED ORDER — FLUOXETINE HCL 20 MG PO CAPS: 60.0000 mg | ORAL_CAPSULE | Freq: Every day | ORAL | 0 refills | Status: AC

## 2019-06-14 MED ORDER — TRAZODONE HCL 150 MG PO TABS: 150.0000 mg | ORAL_TABLET | Freq: Every evening | ORAL | 0 refills | Status: AC | PRN

## 2019-06-14 MED ORDER — GABAPENTIN 300 MG PO CAPS
300.0000 mg | ORAL_CAPSULE | Freq: Three times a day (TID) | ORAL | 0 refills | Status: AC
Start: 2019-06-14 — End: 2019-09-12

## 2019-06-14 NOTE — Progress Notes (Addendum)
BH MD/PA/NP OP Progress Note  06/14/2019 9:43 AM Burnadette Baskett  MRN:  [id number] Interview was conducted using videoconferencing application and I verified that I was speaking with the correct person using two identifiers. I discussed the limitations of evaluation and management by telemedicine and  the availability of in person appointments. Patient expressed understanding and agreed to proceed. Patient location - home; physician - home office.  Chief Complaint: Anxiety, relapsed on alcohol.  HPI: 29yo single white female who has been admitted to [facility] (10/25-27/20) after intentional OD on severalmedicationswith suicidal intent. 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 She continues to struggle with insomnia - previously failed trazodone (one of meds she OD on) and recently zolpidem 10 mg. It helped her fall asleep but she was still waking up. She reports being diagnosed with having GAD and OCD in the past. She was discharged on 120 mg of Cymbalta from [facility] but was only taking 60 mg. Hydroxyzine was prescribed for anxiety/sleep but was ineffective. Prazosin for nightmares was added but she did not take it thinking it is for blood pressure. She gradually discontinuebuspirone which she has been taking for over a year. This was her first psychiatric admission but she admits having one previous suicidal attempt by OD in 2018. Several of her symptoms are related to unexpected death of her fiancee in 10-04-2022 this year. She says she has been dx with PTSD because of that (she found him dead). Kierston additionally has been struggling with alcohol drinking problem - she has been in recovery for about one year (attended AA) but has had few relapses recently including the day she OD. She admits to craving alcohol. She has an individual therapist.We have increased Cymbalta to 90  mg restarted prazosin 2 mg at HS and changed Ambien to CR.She noticed that her mood started to deteriorate and sleep has not improved. We then stopped Cymbalta and restarted Lexapro(now on 20 mg)which she has been on before with good response. She again reports marked improvement in her mood. We added Lunesta but she did not see much benefit from it and stopped it. Over past week she has not taken any sleep aids and her sleep has been fairly normal. She also stopped taking prazosin bacause of dizziness and low blood pressure.Nightmares and flashbacks stopped but she recently (over past 1-2 weeks) again started to have insomnia, anxiety increased and she has been experiencing periods of derealization. She resumed taking trazodone with good effect. She also noticed weight gain (10 lbs) which was previously observed with Lexapro. She used to be on Prozac w/o that problem in the past so we started it instead of escitalopram. She reports depresison to be "gone" but anxiety remains a problem. She also has been again drinking wine on practically daily basis (up to a bottle). I previously prescribed her naltrexone which she filld but never took. Rachell will be moving to [place], [place] where she is from later this week.  Visit Diagnosis:    ICD-10-CM   1. Major depressive disorder, recurrent episode, mild (HCC)  F33.0   2. GAD (generalized anxiety disorder)  F41.1   3. Social anxiety disorder  F40.10   4. Alcohol use disorder, moderate, dependence (HCC)  F10.20     Past Psychiatric History: Please see intake H&P>  Past Medical History:  Past Medical History:  Diagnosis Date  .  Alcohol use disorder, moderate, in early remission (HCC) 12/01/2018  . Anxiety   . Bradycardia   . Depression   . GAD (generalized anxiety disorder) 12/01/2018  . Hypokalemia 10/28/2018  . Major depressive disorder, recurrent episode, mild (HCC) 10/29/2018  . MDD (major depressive disorder) 10/31/2018  . OD (overdose of  drug) 10/28/2018  . Posttraumatic stress disorder 11/08/2018  . Social anxiety disorder 12/01/2018  . Suicidal behavior 10/31/2018  . Suicide attempt [facility])     Past Surgical History:  Procedure Laterality Date  . WISDOM TOOTH EXTRACTION      Family Psychiatric History: Reviewed.  Family History:  Family History  Problem Relation Age of Onset  . Depression Mother   . Alcohol abuse Father   . Alcohol abuse Cousin     Social History:  Social History   Socioeconomic History  . Marital status: Single    Spouse name: Not on file  . Number of children: Not on file  . Years of education: Not on file  . Highest education level: Not on file  Occupational History  . Not on file  Tobacco Use  . Smoking status: Never Smoker  . Smokeless tobacco: Never Used  Substance and Sexual Activity  . Alcohol use: Yes    Comment: occ  . Drug use: Never  . Sexual activity: Yes    Birth control/protection: Pill  Other Topics Concern  . Not on file  Social History Narrative  . Not on file   Social Determinants of Health   Financial Resource Strain: Low Risk   . Difficulty of Paying Living Expenses: Not hard at all  Food Insecurity: No Food Insecurity  . Worried About Programme researcher, broadcasting/film/video in the Last Year: Never true  . Ran Out of Food in the Last Year: Never true  Transportation Needs: No Transportation Needs  . Lack of Transportation (Medical): No  . Lack of Transportation (Non-Medical): No  Physical Activity: Unknown  . Days of Exercise per Week: 0 days  . Minutes of Exercise per Session: Not on file  Stress: Stress Concern Present  . Feeling of Stress : Very much  Social Connections: Unknown  . Frequency of Communication with Friends and Family: More than three times a week  . Frequency of Social Gatherings with Friends and Family: Never  . Attends Religious Services: Never  . Active Member of Clubs or Organizations: No  . Attends Banker Meetings: Never  .  Marital Status: Not on file    Allergies: No Known Allergies  Metabolic Disorder Labs: No results found for: HGBA1C, MPG No results found for: PROLACTIN No results found for: CHOL, TRIG, HDL, CHOLHDL, VLDL, LDLCALC No results found for: TSH  Therapeutic Level Labs: No results found for: LITHIUM No results found for: VALPROATE No components found for:  CBMZ  Current Medications: Current Outpatient Medications  Medication Sig Dispense Refill  . CAMRESE LO 0.1-0.02 & 0.01 MG tablet Take 1 tablet by mouth daily.    Marland Kitchen FLUoxetine (PROZAC) 20 MG capsule Take 3 capsules (60 mg total) by mouth daily. 270 capsule 0  . gabapentin (NEURONTIN) 300 MG capsule Take 1 capsule (300 mg total) by mouth 3 (three) times daily. 270 capsule 0  . hydrOXYzine (ATARAX/VISTARIL) 50 MG tablet Take 1 tablet (50 mg total) by mouth 2 (two) times daily as needed for anxiety. 30 tablet 2  . loratadine (CLARITIN) 10 MG tablet Take 10 mg by mouth daily.    . meclizine (ANTIVERT) 25  MG tablet Take 1 tablet (25 mg total) by mouth 3 (three) times daily as needed for dizziness. 30 tablet 0  . midodrine (PROAMATINE) 5 MG tablet Take 1 tablet (5 mg total) by mouth 3 (three) times daily with meals. (Patient not taking: Reported on 04/26/2019) 90 tablet 6  . traZODone (DESYREL) 150 MG tablet Take 1 tablet (150 mg total) by mouth at bedtime as needed for sleep. 90 tablet 0   No current facility-administered medications for this visit.     Psychiatric Specialty Exam: Review of Systems  Psychiatric/Behavioral: The patient is nervous/anxious.   All other systems reviewed and are negative.   There were no vitals taken for this visit.There is no height or weight on file to calculate BMI.  General Appearance: Casual and Fairly Groomed  Eye Contact:  Good  Speech:  Clear and Coherent and Normal Rate  Volume:  Normal  Mood:  Anxious  Affect:  Full Range  Thought Process:  Goal Directed and Linear  Orientation:  Full (Time,  Place, and Person)  Thought Content: Logical   Suicidal Thoughts:  No  Homicidal Thoughts:  No  Memory:  Immediate;   Good Recent;   Good Remote;   Good  Judgement:  Fair  Insight:  Fair  Psychomotor Activity:  Normal  Concentration:  Concentration: Good  Recall:  Good  Fund of Knowledge: Good  Language: Good  Akathisia:  Negative  Handed:  Right  AIMS (if indicated): not done  Assets:  Communication Skills Desire for Improvement Financial Resources/Insurance Housing Physical Health Talents/Skills  ADL's:  Intact  Cognition: WNL  Sleep:  Fair   Screenings: AIMS     Admission (Discharged) from 10/31/2018 in [facility]  AIMS Total Score  0    PHQ2-9     Counselor from 11/15/2018 in [place] Counselor from 11/09/2018 in [place]  PHQ-2 Total Score  1  6  PHQ-9 Total Score  10  19       Assessment and Plan: 29yo single white female who has been admitted to [facility] (10/25-27/20) after intentional OD on severalmedicationswith suicidal intent. 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 She continues to struggle with insomnia - previously failed trazodone (one of meds she OD on) and recently zolpidem 10 mg. It helped her fall asleep but she was still waking up. She reports being diagnosed with having GAD and OCD in the past. She was discharged on 120 mg of Cymbalta from [facility] but was only taking 60 mg. Hydroxyzine was prescribed for anxiety/sleep but was ineffective. Prazosin for nightmares was added but she did not take it thinking it is for blood pressure. She gradually discontinuebuspirone which she has been taking for over a year. This was her first psychiatric admission but she admits having one previous suicidal attempt by OD in 2018.  Several of her symptoms are related to unexpected death of her fiancee in 10-06-22 this year. She says she has been dx with PTSD because of that (she found him dead). Jaella additionally has been struggling with alcohol drinking problem (attended AA) but has had few relapses recently including the day she OD. She admits to craving alcohol and has been drinking wine again lately. We have increased Cymbalta to 90 mg restarted prazosin 2 mg at HS and  changed Ambien to CR.She noticed that her mood started to deteriorate and sleep has not improved. We then stopped Cymbalta and restarted Lexapro(now on 20 mg)which she has been on before with good response. She again reports marked improvement in her mood. We added Lunesta but she did not see much benefit from it and stopped it. Over past week she has not taken any sleep aids and her sleep has been fairly normal. She also stopped taking prazosin bacause of dizziness and low blood pressure.Nightmares and flashbacks stopped but she recently (over past 1-2 weeks) again started to have insomnia, anxiety increased and she has been experiencing periods of derealization. She resumed taking trazodone with good effect. She also noticed weight gain (10 lbs) which was previously observed with Lexapro. She used to be on Prozac w/o that problem in the past so we started it instead of escitalopram. She reports depresison to be "gone" but anxiety remains a problem. She also has been again drinking wine on practically daily basis (up to a bottle). I previously prescribed her naltrexone which she filld but never took. Sheyna will be moving to [place], [place] where she is from later this week.  Dx: MDD recurrent mild; GAD/Social anxiety disorder; PTSD; Alcohol use disorder.  Plan:Continue Prozac 60 mg , trazodone prn insomnia, stop risperidone M tabs 0.5 mg bid prn anxiety (not helpful) and add gabapentin 300 mg tid for anxiety /alcoho,l problem drinking. I also  encouraged her to try naltrexone which she already has. Basha will get 90 day Rx which will allow her some time to look for a new provider in [place]. The plan was discussed with patient who had an opportunity to ask questions and these were all answered. I spend71minutes in videoconferencing with the patient.    Magdalene Patricia, MD 06/14/2019, 9:43 AM

## 2019-07-14 ENCOUNTER — Ambulatory Visit: Admit: 2019-07-14 | Discharge: 2019-07-14 | Payer: PRIVATE HEALTH INSURANCE | Attending: Family | Primary: Family

## 2019-07-14 DIAGNOSIS — F419 Anxiety disorder, unspecified: Secondary | ICD-10-CM

## 2019-07-14 MED ORDER — LAMOTRIGINE 25 MG PO TABS
25 MG | ORAL_TABLET | ORAL | 1 refills | Status: DC
Start: 2019-07-14 — End: 2019-08-11

## 2019-07-14 NOTE — Progress Notes (Signed)
PROGRESS NOTE  Date of Service:  07/14/2019  Address: [facility]  [place] [facility]  [address]  [place] [address]  Dept: [phone]  Loc: [phone]    Subjective:      Patient ID: <[id number]>  Vickie Wallace is a 29 y.o. female    HPI: patient is here to establish care, patient recently lost her fiance from a creatium drug over dose patient did find him. Patient does have history of alcoholism. Patient was 18 months sober. Patient is working as a Public librarian.     Patient does not feel like her anxiety is well controlled. Patient does have hydroxyzine, does not make her tired but does not feel like it works well. Patient was prescribed atarax from a psychiatry. Patient has been doing therapy regularly march of 2020. Patient has been on a lot of medications, celexa, lexapro, zoloft, buspar, effexor, and Cymbalta, gabapentin.  Patient is getting up through the night, she said that she is sweating at night.     Review of Systems   Constitutional: Negative for chills, fatigue and fever.   Psychiatric/Behavioral: Negative for self-injury, sleep disturbance and suicidal ideas. The patient is nervous/anxious.    All other systems reviewed and are negative.      Objective:   Physical Exam  Vitals reviewed.   Constitutional:       Appearance: Normal appearance.   Cardiovascular:      Rate and Rhythm: Normal rate and regular rhythm.      Pulses: Normal pulses.      Heart sounds: Normal heart sounds.   Pulmonary:      Effort: Pulmonary effort is normal.      Breath sounds: Normal breath sounds.   Skin:     Capillary Refill: Capillary refill takes less than 2 seconds.   Neurological:      General: No focal deficit present.      Mental Status: She is alert and oriented to person, place, and time.   Psychiatric:         Mood and Affect: Mood normal.         Behavior: Behavior normal.         Thought Content: Thought content normal.         Judgment: Judgment normal.            Plan:   1. Anxiety  Patient has tried several medications for her anxiety and depression she is on 60 mg of Prozac currently patient is having night sweats she is also taking hydroxyzine 50 mg which is not helping educated patient to increase to 100 mg of hydroxyzine will start patient on Lamictal as well.  - lamoTRIgine (LAMICTAL) 25 MG tablet; 1 tablet a day for 1 week, 2 tablets daily for 2 weeks then, 3 tablets daily for 3 weeks.  Dispense: 90 tablet; Refill: 1    2. Moderate episode of recurrent major depressive disorder (HCC)  Daily patient would benefit from a mood stabilizer.  Patient recently lost fianc??.  We will start Lamictal patient's been on several SSRIs SNRIs.  Do not want to do a controlled medication due to patient's history of addiction.  Gave patient to names today for therapist.  - lamoTRIgine (LAMICTAL) 25 MG tablet; 1 tablet a day for 1 week, 2 tablets daily for 2 weeks then, 3 tablets daily for 3 weeks.  Dispense: 90 tablet; Refill: 1  Electronically signed by Carma Lair, APRN - CNP on 07/14/19 at 1:16 PM EDT     This dictation was generated by voice recognition computer software. Although all attempts are made to edit the dictation for accuracy, there may be errors in the transcription that were not intended.

## 2019-07-14 NOTE — Patient Instructions (Addendum)
[facility]     Address: [address], [place], [place] [address]  Hours:   Open ? Closes 5PM  Phone: [phone]

## 2019-07-22 MED ORDER — FLUOXETINE HCL 20 MG PO CAPS
20 MG | ORAL_CAPSULE | Freq: Every day | ORAL | 1 refills | Status: DC
Start: 2019-07-22 — End: 2019-09-14

## 2019-07-22 MED ORDER — HYDROXYZINE HCL 50 MG PO TABS
50 MG | ORAL_TABLET | Freq: Three times a day (TID) | ORAL | 0 refills | Status: DC | PRN
Start: 2019-07-22 — End: 2019-09-14

## 2019-07-22 MED ORDER — TRAZODONE HCL 150 MG PO TABS
150 MG | ORAL_TABLET | Freq: Every evening | ORAL | 0 refills | Status: DC
Start: 2019-07-22 — End: 2019-09-14

## 2019-07-22 NOTE — Telephone Encounter (Signed)
Medication:   Requested Prescriptions     Pending Prescriptions Disp Refills   ??? FLUoxetine (PROZAC) 20 MG capsule 30 capsule      Sig: Take 3 capsules by mouth daily   ??? hydrOXYzine (ATARAX) 50 MG tablet       Sig: Take 1 tablet by mouth 3 times daily as needed for Itching   ??? traZODone (DESYREL) 150 MG tablet       Sig: Take 1 tablet by mouth nightly        Last Filled:      Patient Phone Number: [phone] (home)     Last appt:  07/14/19  Next appt: 08/11/19    Last OARRS: No flowsheet data found.

## 2019-07-22 NOTE — Telephone Encounter (Signed)
-----   Message from [facility] sent at 07/22/2019 10:38 AM EDT -----  Subject: Refill Request    QUESTIONS  Name of Medication? FLUoxetine (PROZAC) 20 MG capsule  Patient-reported dosage and instructions? 3 pill a day  How many days do you have left? 6  Preferred Pharmacy? [facility]  Pharmacy phone number (if available)? [phone]  ---------------------------------------------------------------------------  --------------,  Name of Medication? traZODone (DESYREL) 150 MG tablet  Patient-reported dosage and instructions? 1 day  How many days do you have left? 6  Preferred Pharmacy? [facility]  Pharmacy phone number (if available)? [phone]  Additional Information for Provider? Pt is also requesting a refill of the   Hydroxyzine, 50 mg takes as needed, she has 5 days left. please call pt to   discuss a refill on this. It is not showing in her chart. [phone]  ---------------------------------------------------------------------------  --------------  Cleotis Lema INFO  What is the best way for the office to contact you? OK to leave message on   voicemail  Preferred Call Back Phone Number? [phone]

## 2019-08-11 ENCOUNTER — Ambulatory Visit: Admit: 2019-08-11 | Discharge: 2019-08-11 | Payer: PRIVATE HEALTH INSURANCE | Attending: Family | Primary: Family

## 2019-08-11 DIAGNOSIS — F331 Major depressive disorder, recurrent, moderate: Secondary | ICD-10-CM

## 2019-08-11 MED ORDER — LAMOTRIGINE 100 MG PO TABS
100 MG | ORAL_TABLET | Freq: Every day | ORAL | 0 refills | Status: DC
Start: 2019-08-11 — End: 2019-09-14

## 2019-08-11 NOTE — Progress Notes (Signed)
PROGRESS NOTE  Date of Service:  08/11/2019  Address: [facility]  [place] [facility]  [address]  [place] [address]  Dept: [phone]  Loc: [phone]    Subjective:      Patient ID: <[id number]>  Vickie Wallace is a 29 y.o. female    HPI: Patient is here for follow up her anxiety.    Anxiety/Depression: Patient states the Lamictal is helping with her anxiety. She is feeling a lot better. She is 1 month sober now. She is still not sleeping well at night even with the trazodone at her current dose. She has no problem falling asleep at night but has trouble staying awake due to nightmares. Denies any problems with her bowels. Her appetite has been okay. She does take her Lamictal at night because if she takes it in the daytime she feels that it would give her heartburn. She has noticed on 2 separate occasions she had leg jerking but doesn't remember when it last occurred. Denies any suicidal ideations or harming of others.       Review of Systems   Constitutional: Negative for appetite change, chills, fatigue and fever.   Respiratory: Negative for chest tightness and shortness of breath.    Cardiovascular: Negative for chest pain and palpitations.   Gastrointestinal: Negative for constipation, diarrhea and nausea.   Psychiatric/Behavioral: Positive for sleep disturbance. Negative for agitation, self-injury and suicidal ideas. The patient is not nervous/anxious.    All other systems reviewed and are negative.      Objective:   Physical Exam  Vitals reviewed.   Constitutional:       Appearance: Normal appearance.   Cardiovascular:      Rate and Rhythm: Normal rate and regular rhythm.      Pulses: Normal pulses.      Heart sounds: Normal heart sounds.   Pulmonary:      Effort: Pulmonary effort is normal.      Breath sounds: Normal breath sounds.   Skin:     Capillary Refill: Capillary refill takes less than 2 seconds.   Neurological:      General: No focal  deficit present.      Mental Status: She is alert and oriented to person, place, and time.   Psychiatric:         Mood and Affect: Mood normal.         Behavior: Behavior normal.         Thought Content: Thought content normal.         Judgment: Judgment normal.       PHQ Scores 08/11/2019 07/14/2019   PHQ2 Score 0 3   PHQ9 Score 6 3      GAD 7 SCORE 08/11/2019 07/14/2019   GAD-7 Total Score 0 16        Plan:   1. Moderate episode of recurrent major depressive disorder [facility])  Patient is doing well with Lamictal.  Patient still not sleeping well.  Patient's depression score is slightly more elevated.  We will continue current medication patient is can decrease Prozac to see if this helps her insomnia and sweating.  Patient did recently lose her boyfriend.  Patient is having nightmares patient's tried medication from nightmares at night.  She said it caused her to having lower blood pressure than she has.  - lamoTRIgine (LAMICTAL) 100 MG tablet; Take 1 tablet by mouth daily  Dispense: 90 tablet; Refill: 0    2. Anxiety  Patient anxiety is much better controlled patient's anxiety was 16 out of 0.  We will continue current medication.  Patient takes Atarax sparingly.  Patient said that does not make her drowsy.  Patient will continue trazodone as well for sleep.  - lamoTRIgine (LAMICTAL) 100 MG tablet; Take 1 tablet by mouth daily  Dispense: 90 tablet; Refill: 0                       Electronically signed by Carma Lair, APRN - CNP on 08/11/19 at 1:15 PM EDT     This dictation was generated by voice recognition computer software. Although all attempts are made to edit the dictation for accuracy, there may be errors in the transcription that were not intended.

## 2019-08-30 ENCOUNTER — Encounter

## 2019-08-30 NOTE — Telephone Encounter (Signed)
Medication:   Requested Prescriptions     Pending Prescriptions Disp Refills   ??? Levonorgest-Eth Estrad 91-Day (CAMRESE LO) 0.1-0.02 & 0.01 MG TABS 91 tablet      Sig: Take by mouth        Last Filled:  07/14/19    Patient Phone Number: [phone] (home)     Last appt: 08/11/2019   Next appt: 09/14/2019    Last OARRS: No flowsheet data found.

## 2019-09-05 MED ORDER — LEVONORGEST-ETH ESTRAD 91-DAY 0.1-0.02 & 0.01 MG PO TABS
ORAL_TABLET | Freq: Every day | ORAL | 2 refills | Status: DC
Start: 2019-09-05 — End: 2019-11-25

## 2019-09-05 NOTE — Telephone Encounter (Signed)
Medication:   Requested Prescriptions     Pending Prescriptions Disp Refills   ??? Levonorgest-Eth Estrad 91-Day (CAMRESE LO) 0.1-0.02 & 0.01 MG TABS 91 tablet 0     Sig: Take 1 tablet by mouth daily        Last Filled:  Listed as historical medication, never filled.     Patient Phone Number: [phone] (home)     Last appt: 08/11/2019   Next appt: 09/14/2019    Last OARRS: No flowsheet data found.

## 2019-09-05 NOTE — Telephone Encounter (Signed)
Disp Refills Start End    Levonorgest-Eth Estrad 91-Day (CAMRESE LO) 0.1-0.02 & 0.01 MG TABS 91 tablet 2 09/05/2019     Sig - Route: Take 1 tablet by mouth daily - Oral    Sent to pharmacy as: Terri Piedra 91-Day 0.1-0.02 & 0.01 MG Oral Tablet (Camrese Lo)    E-Prescribing Status: Receipt confirmed by pharmacy (09/05/2019 ??1:56 PM EDT)      Provider sent in.

## 2019-09-05 NOTE — Telephone Encounter (Signed)
Pt calld said she requested a refill last week on her:    Levonorgest-Eth Estrad 91-Day (CAMRESE LO) 0.1-0.02 & 0.01 MG TABS     Never received would like to switch her pharmacy to the [facility] [address]

## 2019-09-05 NOTE — Telephone Encounter (Signed)
Changed pharmacy in chart.

## 2019-09-14 ENCOUNTER — Telehealth: Admit: 2019-09-14 | Discharge: 2019-09-14 | Payer: PRIVATE HEALTH INSURANCE | Attending: Family | Primary: Family

## 2019-09-14 DIAGNOSIS — F331 Major depressive disorder, recurrent, moderate: Secondary | ICD-10-CM

## 2019-09-14 MED ORDER — LAMOTRIGINE 100 MG PO TABS
100 MG | ORAL_TABLET | Freq: Every day | ORAL | 1 refills | Status: DC
Start: 2019-09-14 — End: 2020-03-22

## 2019-09-14 MED ORDER — FLUOXETINE HCL 40 MG PO CAPS
40 MG | ORAL_CAPSULE | Freq: Every day | ORAL | 1 refills | Status: DC
Start: 2019-09-14 — End: 2020-03-21

## 2019-09-14 MED ORDER — HYDROXYZINE HCL 50 MG PO TABS
50 MG | ORAL_TABLET | Freq: Three times a day (TID) | ORAL | 1 refills | Status: DC | PRN
Start: 2019-09-14 — End: 2020-03-21

## 2019-09-14 MED ORDER — TRAZODONE HCL 150 MG PO TABS
150 MG | ORAL_TABLET | Freq: Every evening | ORAL | 1 refills | Status: DC
Start: 2019-09-14 — End: 2020-03-15

## 2019-09-14 NOTE — Progress Notes (Signed)
09/14/2019    TELEHEALTH EVALUATION -- Audio/Visual (During COVID-19 public health emergency)    HPI:    Vickie Wallace (DOB: [birth year]) has requested an audio/video evaluation for the following concern(s):    Patient is on video visit for anxiety and depression.  Patient is doing well on current medications.  Patient is tolerating medications well patient feels doses are working well for her.  Patient's anxiety and depression score are stable.  Patient feels that she is tolerating medication and not exhibiting side effects.    Review of Systems   Constitutional: Negative for chills, fatigue and fever.   Psychiatric/Behavioral: Negative for self-injury and sleep disturbance. The patient is not nervous/anxious.    All other systems reviewed and are negative.      PHYSICAL EXAMINATION:  [ INSTRUCTIONS:  "[x] " Indicates a positive item  "[] " Indicates a negative item  -- DELETE ALL ITEMS NOT EXAMINED]  Vital Signs: (As obtained by patient/caregiver or practitioner observation)    Patient-Reported Vitals 09/14/2019   Patient-Reported Weight 143lb   Patient-Reported Height 65in   Patient-Reported Temperature 98         Constitutional: [x]  Appears well-developed and well-nourished [x]  No apparent distress      []  Abnormal-   Mental status  [x]  Alert and awake  [x]  Oriented to person/place/time [x] Able to follow commands      Eyes:  EOM    []   Normal  []  Abnormal-  Sclera  []   Normal  []  Abnormal -         Discharge []   None visible  []  Abnormal -    HENT:   []  Normocephalic, atraumatic.  []  Abnormal   []  Mouth/Throat: Mucous membranes are moist.     External Ears []  Normal  []  Abnormal-     Neck: []  No visualized mass     Pulmonary/Chest: [x]  Respiratory effort normal.  [x]  No visualized signs of difficulty breathing or respiratory distress        []  Abnormal-      Musculoskeletal:   [x]  Normal gait with no signs of ataxia         []  Normal range of motion of neck        []  Abnormal-       Neurological:        [x]  No  Facial Asymmetry (Cranial nerve 7 motor function) (limited exam to video visit)          []  No gaze palsy        []  Abnormal-         Skin:        [x]  No significant exanthematous lesions or discoloration noted on facial skin         []  Abnormal-            Psychiatric:       [x]  Normal Affect [x]  No Hallucinations        []  Abnormal-     Other pertinent observable physical exam findings-     PHQ Scores 09/14/2019 08/11/2019 07/14/2019   PHQ2 Score 0 0 3   PHQ9 Score 3 6 3       GAD 7 SCORE 09/14/2019 08/11/2019 07/14/2019   GAD-7 Total Score 2 0 16        ASSESSMENT/PLAN:  1. Moderate episode of recurrent major depressive disorder (HCC)  We will continue current medication.  Patient's depression score is better than last office visit.  - lamoTRIgine (LAMICTAL) 100 MG tablet; Take 1 tablet  by mouth daily  Dispense: 90 tablet; Refill: 1  - FLUoxetine (PROZAC) 40 MG capsule; Take 1 capsule by mouth daily  Dispense: 90 capsule; Refill: 1    2. Anxiety  We will continue current medicine patient is doing well and sleeping well.  Patient's anxiety score is mildly elevated but much better than the first office visit we will continue patient feels this is a manageable anxiety.  - lamoTRIgine (LAMICTAL) 100 MG tablet; Take 1 tablet by mouth daily  Dispense: 90 tablet; Refill: 1  - FLUoxetine (PROZAC) 40 MG capsule; Take 1 capsule by mouth daily  Dispense: 90 capsule; Refill: 1  - hydrOXYzine (ATARAX) 50 MG tablet; Take 1 tablet by mouth 3 times daily as needed for Anxiety  Dispense: 270 tablet; Refill: 1  - traZODone (DESYREL) 150 MG tablet; Take 1 tablet by mouth nightly  Dispense: 90 tablet; Refill: 1      Return in about 6 months (around 03/13/2020) for mood.    Vickie Wallace is a 29 y.o. female being evaluated by a Virtual Visit (video visit) encounter to address concerns as mentioned above.  A caregiver was present when appropriate. 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  Pursuant to the emergency declaration under the [facility] Act and the [employer], 1135 waiver authority and the [employer] and [employer] Act, this Virtual Visit was conducted with patient's (and/or legal guardian's) consent, to reduce the patient's risk of exposure to COVID-19 and provide necessary medical care.  The patient (and/or legal guardian) has also been advised to contact this office for worsening conditions or problems, and seek emergency medical treatment and/or call 911 if deemed necessary.     Patient identification was verified at the start of the visit: Yes    Not billed for time.    Services were provided through a video synchronous discussion virtually to substitute for in-person clinic visit. Patient and provider were located at their individual homes.    --Carma Lair, APRN - CNP on 09/15/2019 at 1:44 PM    An electronic signature was used to authenticate this note.    This dictation was generated by voice recognition computer software. Although all attempts are made to edit the dictation for accuracy, there may be errors in the transcription that were not intended.

## 2019-11-25 MED ORDER — LEVONORGEST-ETH ESTRAD 91-DAY 0.1-0.02 & 0.01 MG PO TABS
ORAL_TABLET | Freq: Every day | ORAL | 2 refills | Status: DC
Start: 2019-11-25 — End: 2020-08-27

## 2019-11-25 NOTE — Telephone Encounter (Signed)
Medication:   Requested Prescriptions     Pending Prescriptions Disp Refills   ??? Levonorgest-Eth Estrad 91-Day (CAMRESE LO) 0.1-0.02 & 0.01 MG TABS 91 tablet 2     Sig: Take 1 tablet by mouth daily        Last Filled:  09/05/19 # 91    Patient Phone Number: [phone] (home)     Last appt: 09/14/2019   Next appt: Visit date not found    Last OARRS: No flowsheet data found.

## 2019-11-25 NOTE — Telephone Encounter (Signed)
Patient requesting a medication refill.    Medication Levonorgest-Eth Estrad 91-day (Camrese LO)  Dosage 0.1-0.02 & 0.01 MG Tabs  Frequency 1 tablet daily  Pharmacy [facility] [id number] [place], [place] - [address] - P [phone] - F [phone]   Next office visit None

## 2020-03-12 NOTE — Telephone Encounter (Signed)
From: Gabriel Earing  To: Carma Lair  Sent: 03/11/2020 11:37 AM EST  Subject: Lamictal    Good morning. I hope you are well. I am struggling over here.     I have had some weird symptoms arise as of recently that I believe are related to my Lamictal. I am having what feels like withdrawal symptoms over the past few weeks or so. I take my 100 mg dose in the morning and around the 10-12 hour mark, I have headaches (pain and what feels like pressure all in my head and face) increased heart rate (60s rest to 130s standing), faintness, shortness of breath, unsteadiness, and this weird tingling/buzzing feeling.   I did some googling last night and studies have shown that ???end-of-dose??? withdrawal symptoms have been present in some people regularly taking this drug (without even ceasing treatment). Is it possible my body as grown accustom to this dosage all of a sudden? If that is the case, instead of increasing my dose I would like to get off of it entirely. I took only 50 mg this morning instead of the 100 mg to see how I manage with that and if we would need a slower approach to tapering. The buzzing stops when I take it so I am convinced this is a withdrawal symptom.   I have had quite a few medication changes that we should discuss as well. I have been diagnosed with ADHD and am receiving medication and therapy for that which has significantly improved my anxiety like I did not believe was ever possible. It has completely changed my life. My meds currently are 20 XR Adderall, 100 mg Lamictal, 75 mg Trazodone, and my birth control.   Can we meet this week?

## 2020-03-14 ENCOUNTER — Encounter

## 2020-03-14 NOTE — Telephone Encounter (Signed)
Medication:   Requested Prescriptions     Pending Prescriptions Disp Refills   ??? traZODone (DESYREL) 150 MG tablet [Pharmacy Med Name: TRAZODONE 150 MG TABLET] 90 tablet 1     Sig: TAKE 1 TABLET BY MOUTH EVERY DAY AT NIGHT   ??? FLUoxetine (PROZAC) 40 MG capsule [Pharmacy Med Name: FLUOXETINE HCL 40 MG CAPSULE] 90 capsule 1     Sig: TAKE 1 CAPSULE BY MOUTH EVERY DAY   ??? hydrOXYzine (ATARAX) 50 MG tablet [Pharmacy Med Name: HYDROXYZINE HCL 50 MG TABLET] 270 tablet 1     Sig: TAKE 1 TABLET BY MOUTH THREE TIMES A DAY AS NEEDED FOR ANXIETY   ??? lamoTRIgine (LAMICTAL) 100 MG tablet [Pharmacy Med Name: LAMOTRIGINE 100 MG TABLET] 90 tablet 1     Sig: TAKE 1 TABLET BY MOUTH EVERY DAY        Last Filled:  09/14/2019 #90days each w/ 1 refill each    Patient Phone Number: [phone] (home)      Last appt: 09/14/2019 Return in about 6 months (around 03/13/2020) for mood.      Next appt: Visit date not found - MY CHART APPT REMINDER SENT    Last OARRS: No flowsheet data found.

## 2020-03-14 NOTE — Telephone Encounter (Signed)
LM for patient to call us back for refills and appointment per CW- also sent message in her [facility] account.

## 2020-03-14 NOTE — Telephone Encounter (Signed)
Please asked patient if she still needs these prescriptions?  I believe that she is tapering off of them.  Please ask her which when she needs.  Also looks like she wants to set up a video visit.  Can you please schedule this for her as well.

## 2020-03-15 MED ORDER — TRAZODONE HCL 150 MG PO TABS
150 MG | ORAL_TABLET | ORAL | 1 refills | Status: DC
Start: 2020-03-15 — End: 2020-08-02

## 2020-03-15 NOTE — Telephone Encounter (Signed)
Please note patient message.

## 2020-03-15 NOTE — Telephone Encounter (Signed)
Appt. Scheduled 3/17

## 2020-03-22 ENCOUNTER — Encounter: Payer: PRIVATE HEALTH INSURANCE | Attending: Family | Primary: Family

## 2020-03-22 ENCOUNTER — Telehealth: Admit: 2020-03-22 | Discharge: 2020-03-22 | Payer: PRIVATE HEALTH INSURANCE | Attending: Family | Primary: Family

## 2020-03-22 DIAGNOSIS — F419 Anxiety disorder, unspecified: Secondary | ICD-10-CM

## 2020-03-22 MED ORDER — LAMOTRIGINE 25 MG PO TABS
25 MG | ORAL_TABLET | Freq: Two times a day (BID) | ORAL | 0 refills | Status: DC
Start: 2020-03-22 — End: 2020-04-18

## 2020-03-22 NOTE — Progress Notes (Signed)
03/22/2020    TELEHEALTH EVALUATION -- Audio/Visual (During COVID-19 public health emergency)    HPI:    Vickie Wallace (DOB: [birth year]) has requested an audio/video evaluation for the following concern(s):    Patient is here on vv for her anxiety. Patient is seeing psychiatrist for her adhd. Patient said that adderall is making a big difference. Patient said that she increase her adderall to 30 mg. Patient said that she does drink a lot of water. Patient said that she has been doing liquid iv in the morning which helps. Patient said she started cutting down lamictal 2 weeks ago. Patient said that she broke up with her boyfriend and she notices her anxiety is worse. Patient said that she has been super anxious. She is sleeping well, she said she is getting 7-8 hours. She was getting closer to 9 hours at night but does not feel tired.     Review of Systems   Constitutional: Negative for chills.   Psychiatric/Behavioral: Negative for self-injury, sleep disturbance and suicidal ideas. The patient is nervous/anxious.    All other systems reviewed and are negative.      PHYSICAL EXAMINATION:  [ INSTRUCTIONS:  "[x] " Indicates a positive item  "[] " Indicates a negative item  -- DELETE ALL ITEMS NOT EXAMINED]  Vital Signs: (As obtained by patient/caregiver or practitioner observation)    Patient-Reported Vitals 03/22/2020 09/14/2019   Patient-Reported Weight 145lb 143lb   Patient-Reported Height 66in 65in   Patient-Reported Temperature - 98         Constitutional: [x]  Appears well-developed and well-nourished [x]  No apparent distress      []  Abnormal-   Mental status  [x]  Alert and awake  [x]  Oriented to person/place/time [x] Able to follow commands      Eyes:  EOM    []   Normal  []  Abnormal-  Sclera  []   Normal  []  Abnormal -         Discharge []   None visible  []  Abnormal -    HENT:   [x]  Normocephalic, atraumatic.  []  Abnormal   []  Mouth/Throat: Mucous membranes are moist.     External Ears [x]  Normal  []  Abnormal-      Neck: [x]  No visualized mass     Pulmonary/Chest: [x]  Respiratory effort normal.  [x]  No visualized signs of difficulty breathing or respiratory distress        []  Abnormal-      Musculoskeletal:   [x]  Normal gait with no signs of ataxia         []  Normal range of motion of neck        []  Abnormal-       Neurological:        [x]  No Facial Asymmetry (Cranial nerve 7 motor function) (limited exam to video visit)          []  No gaze palsy        []  Abnormal-         Skin:        [x]  No significant exanthematous lesions or discoloration noted on facial skin         []  Abnormal-            Psychiatric:       [x]  Normal Affect [x]  No Hallucinations        []  Abnormal-     Other pertinent observable physical exam findings-     ASSESSMENT/PLAN:  1. Anxiety  Patient's anxiety has been greater patient does not know  if it is related to her recent change in her moving by herself or she also has had an increase in her Adderall.  Patient understands is important for her to taper her Lamictal slowly.  Patient was on Prozac but stopped and felt like her anxiety was not worse due to this.  Patient has been off Prozac since November and anxiety started a couple weeks ago.  We will try hydroxyzine twice a day to see if this helps with her anxiety patient also talk to her psychiatrist about managing her anxiety as well as her ADHD.  - lamoTRIgine (LAMICTAL) 25 MG tablet; Take 1 tablet by mouth 2 times daily  Dispense: 60 tablet; Refill: 0      Return in about 6 weeks (around 05/03/2020) for mood, vv .    Vickie Wallace is a 30 y.o. female being evaluated by a Virtual Visit (video visit) encounter to address concerns as mentioned above.  A caregiver was present when appropriate. 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  Pursuant to the emergency declaration  under the [facility] Act and the [employer], 1135 waiver authority and the [employer] Act, this Virtual Visit was conducted with patient's (and/or legal guardian's) consent, to reduce the patient's risk of exposure to COVID-19 and provide necessary medical care.  The patient (and/or legal guardian) has also been advised to contact this office for worsening conditions or problems, and seek emergency medical treatment and/or call 911 if deemed necessary.     Patient identification was verified at the start of the visit: Yes    Not billed for time.    Services were provided through a video synchronous discussion virtually to substitute for in-person clinic visit. Patient and provider were located at their individual homes.    --Carma Lair, APRN - CNP on 03/23/2020 at 1:18 PM    An electronic signature was used to authenticate this note.    This dictation was generated by voice recognition computer software. Although all attempts are made to edit the dictation for accuracy, there may be errors in the transcription that were not intended.

## 2020-03-23 ENCOUNTER — Encounter

## 2020-03-23 MED ORDER — HYDROXYZINE HCL 25 MG PO TABS
25 MG | ORAL_TABLET | ORAL | 0 refills | Status: AC | PRN
Start: 2020-03-23 — End: 2021-09-12

## 2020-04-17 ENCOUNTER — Encounter

## 2020-04-17 NOTE — Telephone Encounter (Signed)
Medication:   Requested Prescriptions     Pending Prescriptions Disp Refills   ??? lamoTRIgine (LAMICTAL) 25 MG tablet [Pharmacy Med Name: LAMOTRIGINE 25 MG TABLET] 60 tablet 0     Sig: TAKE 1 TABLET BY MOUTH TWICE A DAY        Last Filled:  [id number]    Patient Phone Number: [phone] (home)     Last appt: 03/22/2020   Next appt: Visit date not found    Last OARRS: No flowsheet data found.

## 2020-04-18 MED ORDER — LAMOTRIGINE 25 MG PO TABS
25 MG | ORAL_TABLET | ORAL | 0 refills | Status: DC
Start: 2020-04-18 — End: 2020-06-21

## 2020-04-23 ENCOUNTER — Encounter

## 2020-06-21 ENCOUNTER — Telehealth: Admit: 2020-06-21 | Discharge: 2020-06-21 | Payer: PRIVATE HEALTH INSURANCE | Attending: Family | Primary: Family

## 2020-06-21 DIAGNOSIS — F419 Anxiety disorder, unspecified: Secondary | ICD-10-CM

## 2020-06-21 MED ORDER — SERTRALINE HCL 25 MG PO TABS
25 MG | ORAL_TABLET | Freq: Every day | ORAL | 1 refills | Status: DC
Start: 2020-06-21 — End: 2020-08-02

## 2020-06-21 NOTE — Telephone Encounter (Signed)
Gabriel Earing "[name]"  Carma Lair, APRN - CNP 1 hour ago (10:50 AM)     KR      Good morning.  I hope you are well.  I think I need to get back on an antidepressant.  My withdrawal from Lamictal has finally subsided (thank God) and though I do not feel depressed, my anxiety is a little too high and particularly around my period.  I have PMDD and just had my first period unmedicated (I only have four a year) and it was brutal when it comes to anxiety.  I am currently prescribed Concerta for my ADHD and I tried talking to my ADHD doctor about getting on an antidepressant but she only deals with ADHD medication and can only fill prescriptions that were already prescribed to me but I am uninterested in taking Prozac again bexause of the night sweats.  I am interested in taking Zoloft.  Please let me know your thoughts and if we should set up an online visit to discuss.  I will call to schedule if so.  Thanks!  [name]

## 2020-06-21 NOTE — Progress Notes (Signed)
06/21/2020    TELEHEALTH EVALUATION -- Audio/Visual (During COVID-19 public health emergency)    HPI:    Vickie Wallace (DOB: 1990-02-06) has requested an audio/video evaluation for the following concern(s):    Patient is on vv for zoloft, patient said that she had been on it shortly but did not have side effects. Patient is having dizziness everyday she feels it is from anxiety. Patient said she does feel much better on anxiety for her adhd. Patient said that she was on the verge of a panic attack due to her periods.     Review of Systems   Constitutional: Negative for chills.   Psychiatric/Behavioral: Negative for self-injury, sleep disturbance and suicidal ideas. The patient is nervous/anxious.    All other systems reviewed and are negative.      PHYSICAL EXAMINATION:  [ INSTRUCTIONS:  "[x] " Indicates a positive item  "[] " Indicates a negative item  -- DELETE ALL ITEMS NOT EXAMINED]  Vital Signs: (As obtained by patient/caregiver or practitioner observation)    Patient-Reported Vitals 06/21/2020 06/21/2020 03/22/2020   Patient-Reported Weight 135lb 135 145lb   Patient-Reported Height 5 6 5???6 66in   Patient-Reported Systolic - 118 -   Patient-Reported Diastolic - 78 -   Patient-Reported Pulse - 65 -   Patient-Reported Temperature - 98 -   Patient-Reported SpO2 - 99 -         Constitutional: [x]  Appears well-developed and well-nourished [x]  No apparent distress      []  Abnormal-   Mental status  [x]  Alert and awake  [x]  Oriented to person/place/time [x] Able to follow commands      Eyes:  EOM    []   Normal  []  Abnormal-  Sclera  []   Normal  []  Abnormal -         Discharge []   None visible  []  Abnormal -    HENT:   [x]  Normocephalic, atraumatic.  []  Abnormal   []  Mouth/Throat: Mucous membranes are moist.     External Ears [x]  Normal  []  Abnormal-     Neck: []  No visualized mass     Pulmonary/Chest: [x]  Respiratory effort normal.  [x]  No visualized signs of difficulty breathing or respiratory distress        []   Abnormal-      Musculoskeletal:   [x]  Normal gait with no signs of ataxia         []  Normal range of motion of neck        []  Abnormal-       Neurological:        [x]  No Facial Asymmetry (Cranial nerve 7 motor function) (limited exam to video visit)          []  No gaze palsy        []  Abnormal-         Skin:        [x]  No significant exanthematous lesions or discoloration noted on facial skin         []  Abnormal-            Psychiatric:       [x]  Normal Affect [x]  No Hallucinations        []  Abnormal-     Other pertinent observable physical exam findings-     ASSESSMENT/PLAN:  1. Anxiety  - patient will start using zoloft for her anxiety patient understand side effects.   - sertraline (ZOLOFT) 25 MG tablet; Take 1 tablet by mouth daily  Dispense: 90 tablet; Refill: 1  Return in about 6 weeks (around 08/02/2020) for mood, VV.    Vickie Wallace is a 30 y.o. female being evaluated by a Virtual Visit (video visit) encounter to address concerns as mentioned above.  A caregiver was present when appropriate. 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  Pursuant to the emergency declaration under the [facility] Act and the [employer], 1135 waiver authority and the [employer] Act, this Virtual Visit was conducted with patient's (and/or legal guardian's) consent, to reduce the patient's risk of exposure to COVID-19 and provide necessary medical care.  The patient (and/or legal guardian) has also been advised to contact this office for worsening conditions or problems, and seek emergency medical treatment and/or call 911 if deemed necessary.     Patient identification was verified at the start of the visit: Yes    Not billed for time.    Services were provided through a video synchronous  discussion virtually to substitute for in-person clinic visit. Patient and provider were located at their individual homes.    --Carma Lair, APRN - CNP on 06/21/2020 at 5:02 PM    An electronic signature was used to authenticate this note.    This dictation was generated by voice recognition computer software. Although all attempts are made to edit the dictation for accuracy, there may be errors in the transcription that were not intended.

## 2020-08-02 ENCOUNTER — Telehealth: Admit: 2020-08-02 | Discharge: 2020-08-02 | Payer: PRIVATE HEALTH INSURANCE | Attending: Family | Primary: Family

## 2020-08-02 DIAGNOSIS — F419 Anxiety disorder, unspecified: Secondary | ICD-10-CM

## 2020-08-02 MED ORDER — SERTRALINE HCL 25 MG PO TABS
25 MG | ORAL_TABLET | Freq: Every day | ORAL | 1 refills | Status: DC
Start: 2020-08-02 — End: 2021-03-15

## 2020-08-02 MED ORDER — TRAZODONE HCL 150 MG PO TABS
150 MG | ORAL_TABLET | ORAL | 1 refills | Status: DC
Start: 2020-08-02 — End: 2021-08-22

## 2020-08-02 NOTE — Progress Notes (Signed)
08/02/2020    TELEHEALTH EVALUATION -- Audio/Visual (During COVID-19 public health emergency)    HPI:    Vickie Wallace (DOB: 1990-03-07) has requested an audio/video evaluation for the following concern(s):    Patient is on vv for her anxiety. Patient said that she has started running which she feels does help adhd, patient said that she did not like the stimulant when she is running. Patient is getting ok sleep, she was only getting 6-7 hours she said she sleeps better going to bed to sleep     GAD 7 SCORE 08/02/2020 09/14/2019 08/11/2019   GAD-7 Total Score 2 2 0          Review of Systems   Constitutional:  Negative for chills, fatigue and fever.   Psychiatric/Behavioral:  Negative for self-injury, sleep disturbance and suicidal ideas. The patient is nervous/anxious.    All other systems reviewed and are negative.    PHYSICAL EXAMINATION:  [ INSTRUCTIONS:  "[x] " Indicates a positive item  "[] " Indicates a negative item  -- DELETE ALL ITEMS NOT EXAMINED]  Vital Signs: (As obtained by patient/caregiver or practitioner observation)    Patient-Reported Vitals 08/02/2020 06/21/2020 06/21/2020   Patient-Reported Weight 135lb 135lb 135   Patient-Reported Height 66in 5 6 5???6   Patient-Reported Systolic - - 118   Patient-Reported Diastolic - - 78   Patient-Reported Pulse - - 65   Patient-Reported Temperature - - 98   Patient-Reported SpO2 - - 99         Constitutional: [x]  Appears well-developed and well-nourished [x]  No apparent distress      []  Abnormal-   Mental status  [x]  Alert and awake  [x]  Oriented to person/place/time [x] Able to follow commands      Eyes:  EOM    [x]   Normal  []  Abnormal-  Sclera  []   Normal  []  Abnormal -         Discharge []   None visible  []  Abnormal -    HENT:   [x]  Normocephalic, atraumatic.  []  Abnormal   []  Mouth/Throat: Mucous membranes are moist.     External Ears [x]  Normal  []  Abnormal-     Neck: [x]  No visualized mass     Pulmonary/Chest: [x]  Respiratory effort normal.  []  No visualized  signs of difficulty breathing or respiratory distress        []  Abnormal-      Musculoskeletal:   [x]  Normal gait with no signs of ataxia         []  Normal range of motion of neck        []  Abnormal-       Neurological:        [x]  No Facial Asymmetry (Cranial nerve 7 motor function) (limited exam to video visit)          []  No gaze palsy        []  Abnormal-         Skin:        [x]  No significant exanthematous lesions or discoloration noted on facial skin         []  Abnormal-            Psychiatric:       [x]  Normal Affect [x]  No Hallucinations        []  Abnormal-     Other pertinent observable physical exam findings-     ASSESSMENT/PLAN:  1. Anxiety  Patient is doing well on current medications.  We will continue Zoloft and  trazodone patient will follow up in 6 months.  - sertraline (ZOLOFT) 25 MG tablet; Take 1 tablet by mouth in the morning.  Dispense: 90 tablet; Refill: 1  - traZODone (DESYREL) 150 MG tablet; TAKE 1 TABLET BY MOUTH EVERY DAY AT NIGHT  Dispense: 90 tablet; Refill: 1    No follow-ups on file.    Vickie Wallace is a 30 y.o. female being evaluated by a Virtual Visit (video visit) encounter to address concerns as mentioned above.  A caregiver was present when appropriate. 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  Pursuant to the emergency declaration under the [facility] Act and the [employer], 1135 waiver authority and the [employer] and [employer] Act, this Virtual Visit was conducted with patient's (and/or legal guardian's) consent, to reduce the patient's risk of exposure to COVID-19 and provide necessary medical care.  The patient (and/or legal guardian) has also been advised to contact this office for worsening conditions or problems, and seek emergency medical treatment  and/or call 911 if deemed necessary.     Patient identification was verified at the start of the visit: Yes    Not billed for time.    Services were provided through a video synchronous discussion virtually to substitute for in-person clinic visit. Patient and provider were located at their individual homes.    --Carma Lair, APRN - CNP on 08/02/2020 at 12:44 PM    An electronic signature was used to authenticate this note.    This dictation was generated by voice recognition computer software. Although all attempts are made to edit the dictation for accuracy, there may be errors in the transcription that were not intended.

## 2020-08-27 MED ORDER — LEVONORGEST-ETH ESTRAD 91-DAY 0.1-0.02 & 0.01 MG PO TABS
ORAL_TABLET | ORAL | 2 refills | Status: AC
Start: 2020-08-27 — End: 2021-05-29

## 2020-08-27 NOTE — Telephone Encounter (Signed)
Medication:   Requested Prescriptions     Pending Prescriptions Disp Refills    Levonorgest-Eth Estrad 91-Day 0.1-0.02 & 0.01 MG TABS [Pharmacy Med Name: LEVONOR-E ESTRAD 0.1-0.02-0.01] 91 tablet 2     Sig: TAKE 1 TABLET BY MOUTH EVERY DAY        Last Filled: [id number]     Patient Phone Number: [phone] (home)     Last appt: 08/02/2020   Next appt: Visit date not found    Last OARRS: No flowsheet data found.

## 2020-10-27 IMAGING — MR MR HEAD W/O CM
10 series · 48 of 48 positions shown · non-contrast
Comparison: None.

CLINICAL DATA: Dizziness, worsening for 5 months.

EXAM:
MRI HEAD WITHOUT CONTRAST
TECHNIQUE: Multiplanar, multiecho pulse sequences of the brain and surrounding
structures were obtained without intravenous contrast.

[Series 2: t1_se_sag · sagittal · 5.0mm · 0.45mm/px · 2 of 22 slices shown]
[im 1/22]
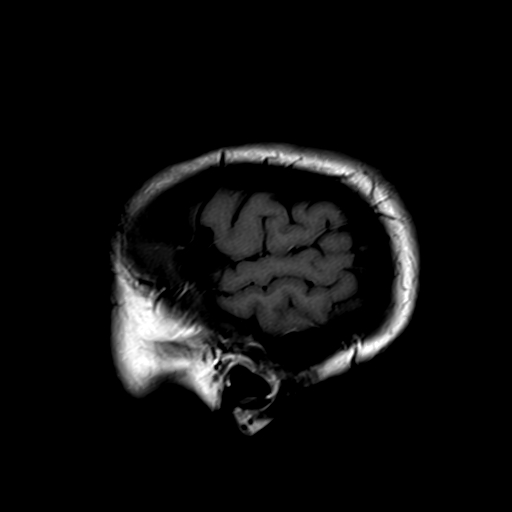
[im 22/22]
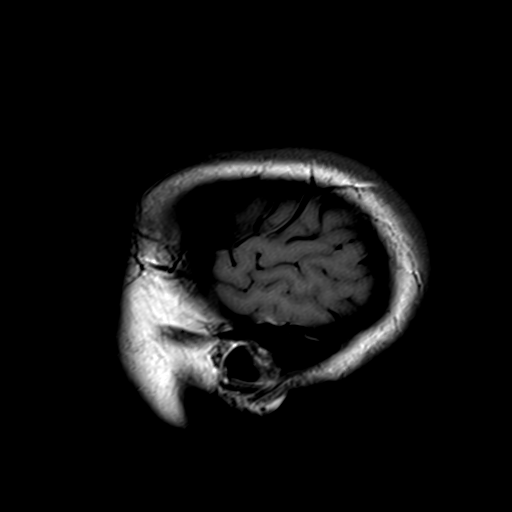

[Series 3: t2_tse_tra_512 · axial · 5.0mm · 0.60mm/px · z∈[-47,+89]mm · 2 of 24 slices shown]
[im 1/24]
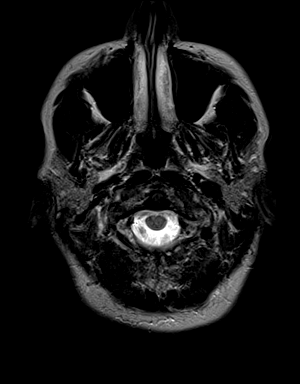
[im 24/24]
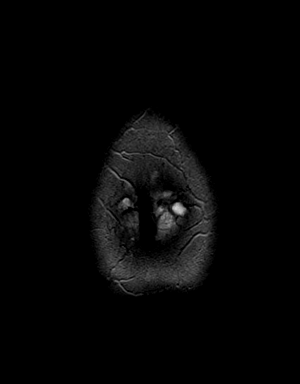

[Series 4: ep2d_diff_ · axial · 3.0mm · 1.80mm/px · z∈[-58,+76]mm · 9 of 92 slices shown]
[im 1/92]
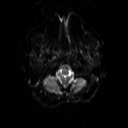
[im 12/92]
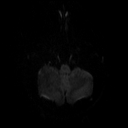
[im 23/92]
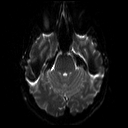
[im 35/92]
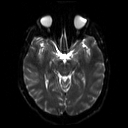
[im 46/92]
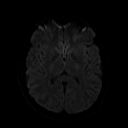
[im 57/92]
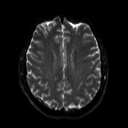
[im 69/92]
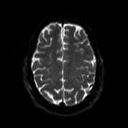
[im 80/92]
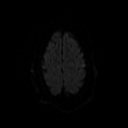
[im 92/92]
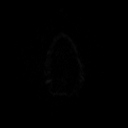

[Series 5: ep2d_diff__adc · axial · 3.0mm · 1.80mm/px · z∈[-58,+76]mm · 4 of 46 slices shown]
[im 1/46]
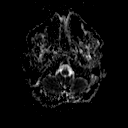
[im 16/46]
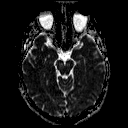
[im 31/46]
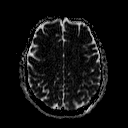
[im 46/46]
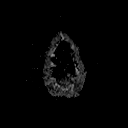

[Series 6: ep2d_diff_cor · coronal · 5.0mm · 1.77mm/px · 5 of 52 slices shown]
[im 1/52]
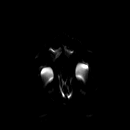
[im 13/52]
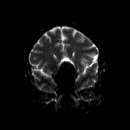
[im 26/52]
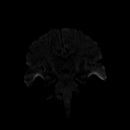
[im 39/52]
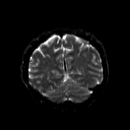
[im 52/52]
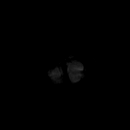

[Series 7: ep2d_diff_cor_adc · coronal · 5.0mm · 1.77mm/px · 3 of 26 slices shown]
[im 1/26]
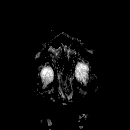
[im 13/26]
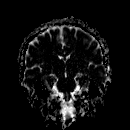
[im 26/26]
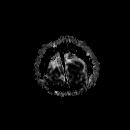

[Series 9: swi_images · axial · 4.0mm · 0.90mm/px · z∈[-48,+90]mm · 3 of 36 slices shown]
[im 1/36]
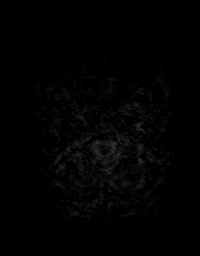
[im 18/36]
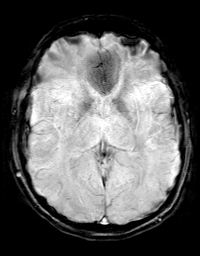
[im 36/36]
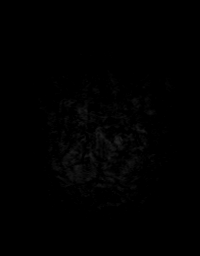

[Series 10: FLAIR · axial · 3.0mm · 0.43mm/px · z∈[-58,+95]mm · 3 of 27 slices shown]
[im 1/27]
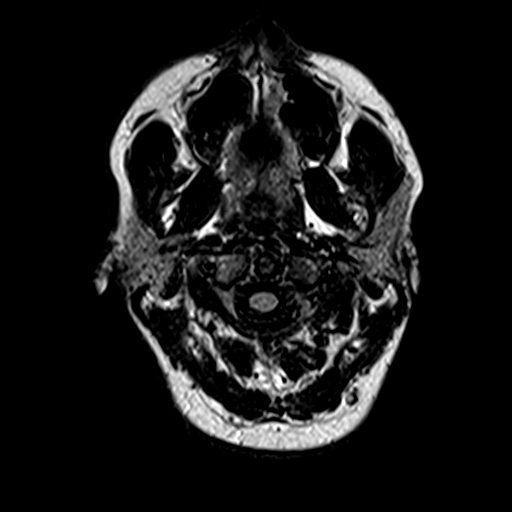
[im 14/27]
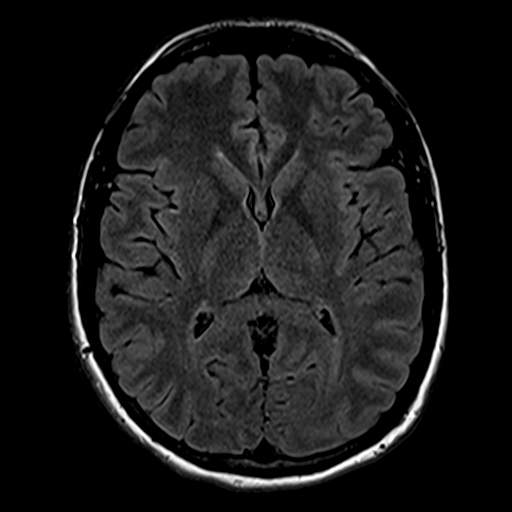
[im 27/27]
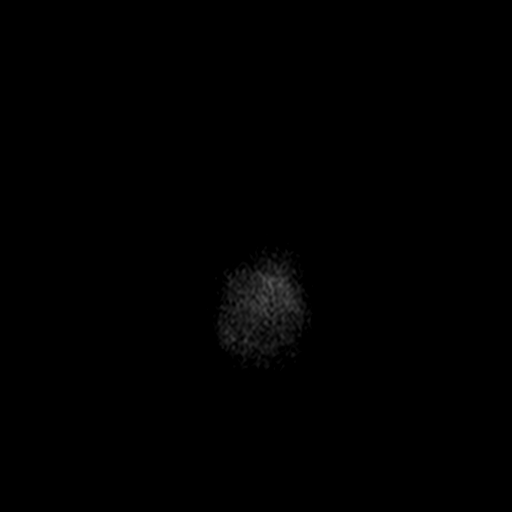

[Series 11: t1_mpr_tra · axial · 1.0mm · 0.72mm/px · z∈[-49,+91]mm · 14 of 144 slices shown]
[im 1/144]
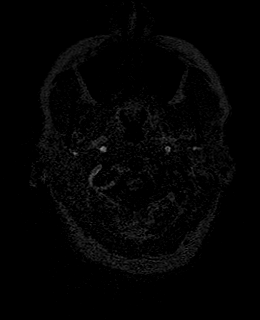
[im 12/144]
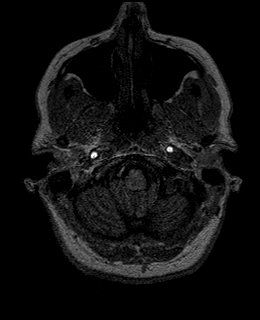
[im 23/144]
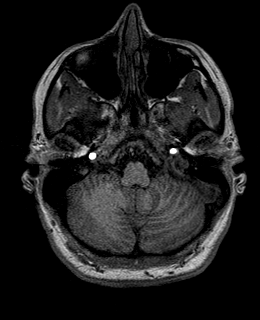
[im 34/144]
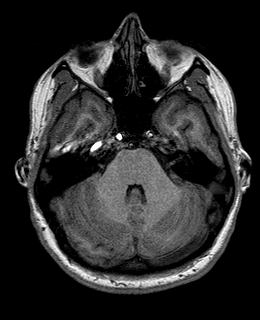
[im 45/144]
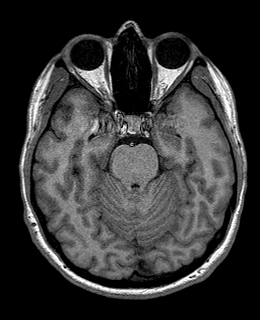
[im 56/144]
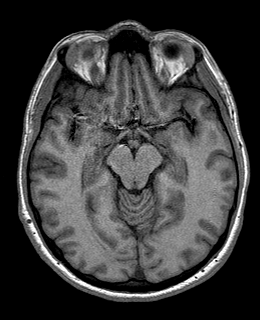
[im 67/144]
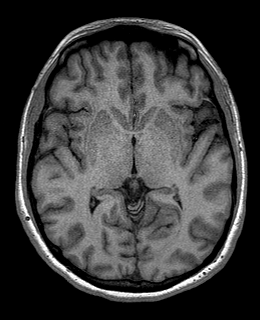
[im 78/144]
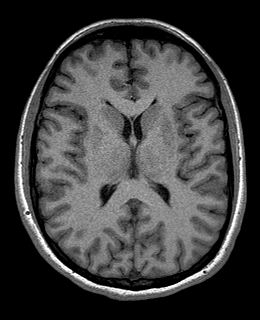
[im 89/144]
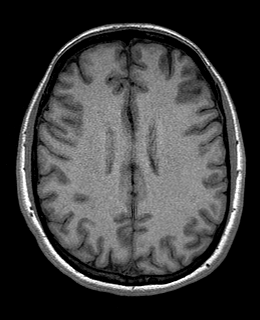
[im 100/144]
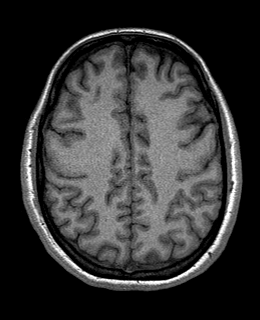
[im 111/144]
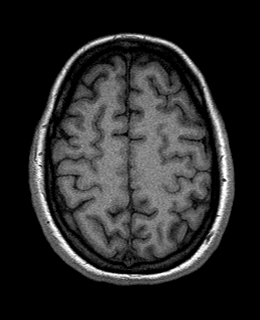
[im 122/144]
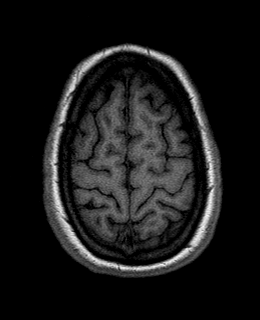
[im 133/144]
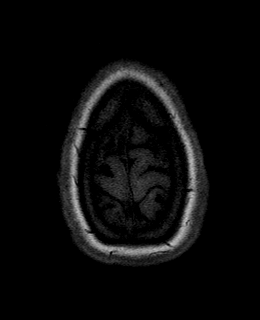
[im 144/144]
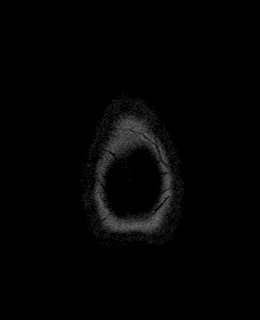

[Series 12: T2 · coronal · 5.0mm · 0.45mm/px · 3 of 26 slices shown]
[im 1/26]
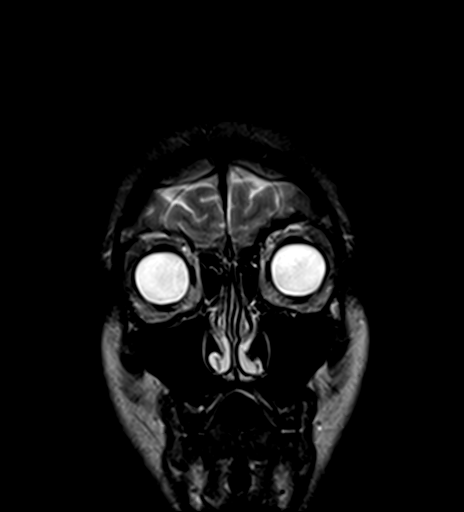
[im 13/26]
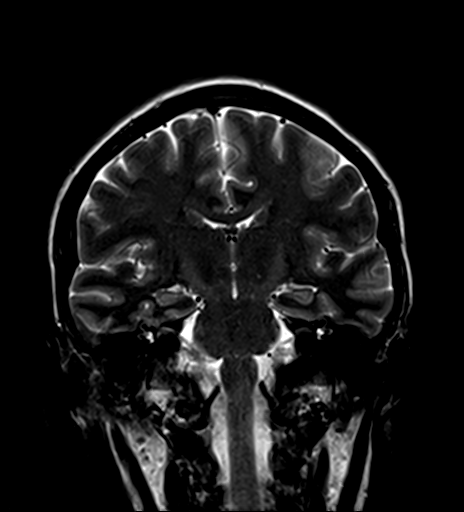
[im 26/26]
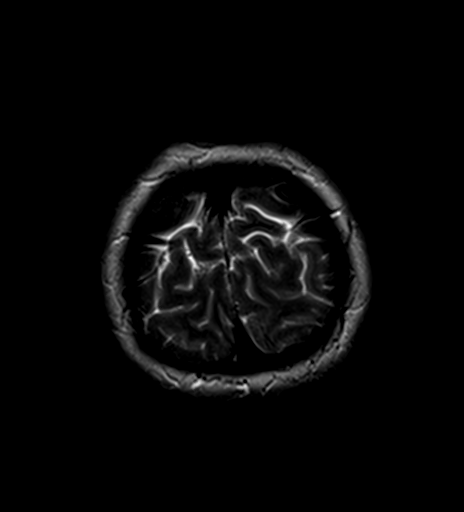

[48 of 48 positions shown; findings below may reference images not displayed]

FINDINGS: BRAIN: No acute infarct, acute hemorrhage or extra-axial collection.
Normal white matter signal for age. Normal volume of brain
parenchyma and CSF spaces. Midline structures are normal.

VASCULAR: Major flow voids are preserved. Susceptibility-sensitive
sequences show no chronic microhemorrhage or superficial siderosis.

SKULL AND UPPER CERVICAL SPINE: Normal calvarium and skull base.
Visualized upper cervical spine and soft tissues are normal.

SINUSES/ORBITS: No paranasal sinus fluid levels or advanced mucosal
thickening. No mastoid or middle ear effusion. Normal orbits.
IMPRESSION: Normal brain MRI.

## 2020-11-01 ENCOUNTER — Ambulatory Visit: Admit: 2020-11-01 | Discharge: 2020-11-01 | Payer: PRIVATE HEALTH INSURANCE | Attending: Family | Primary: Family

## 2020-11-01 DIAGNOSIS — R42 Dizziness and giddiness: Secondary | ICD-10-CM

## 2020-11-01 MED ORDER — PREDNISONE 20 MG PO TABS
20 MG | ORAL_TABLET | Freq: Every day | ORAL | 0 refills | Status: DC
Start: 2020-11-01 — End: 2021-01-17

## 2020-11-01 NOTE — Progress Notes (Signed)
PROGRESS NOTE  Date of Service:  11/01/2020  Address: [facility]  [facility]  [address]  [place] [address]  Dept: [phone]  Loc: [phone]    Subjective:      Patient ID: [id number]  Vickie Wallace is a 30 y.o. female    HPI: patient said that she is having dizziness. Patient said that she is dizzy, she said her eyes feels weird, she said she feels like she is moving. She feels like she is on a boat. Patient did have eye exam last year, she said last year it was ok. Patient said the dizziness started getting bad again in the last couple of weeks, patient does have pressure in her head, she does take ibuprofen every once and a while. Patient ran a half marathon at the beginning. Patient was having dizziness then.     Patient said since she came off the lamictal she felt weird internal vibrating, she said she feels like she would be shaking. She said it was bad when she was stopping it but she constantly feels like she is shaking. Patient is sleeping 8 hours at night, she is sleeping most nights.     Review of Systems   Constitutional:  Negative for chills and fatigue.   Eyes:  Negative for pain and redness.   Gastrointestinal:  Negative for constipation, diarrhea, nausea, rectal pain and vomiting.   Neurological:  Positive for dizziness and headaches.   Psychiatric/Behavioral:  The patient is nervous/anxious.    All other systems reviewed and are negative.  Objective:   Physical Exam  Vitals reviewed.   Constitutional:       Appearance: Normal appearance. She is well-developed.   HENT:      Head: Normocephalic and atraumatic.      Right Ear: Ear canal and external ear normal. A middle ear effusion is present.      Left Ear: Ear canal and external ear normal. A middle ear effusion is present.      Nose: Nose normal.      Mouth/Throat:      Pharynx: Uvula midline. No oropharyngeal exudate.   Cardiovascular:      Rate and Rhythm: Normal rate and  regular rhythm.      Pulses: Normal pulses.      Heart sounds: Normal heart sounds. No murmur heard.  Pulmonary:      Effort: Pulmonary effort is normal.      Breath sounds: Normal breath sounds.   Skin:     General: Skin is warm and dry.      Capillary Refill: Capillary refill takes less than 2 seconds.   Neurological:      General: No focal deficit present.      Mental Status: She is alert and oriented to person, place, and time.   Psychiatric:         Mood and Affect: Mood normal.         Behavior: Behavior normal.         Thought Content: Thought content normal.         Judgment: Judgment normal.       Plan:   1. Dizziness unsure etiology of dizziness its been going on for several years.  Patient's had an echo and MRI which were benign.  Patient will get blood work today.  Patient does have fluid and inner ears are recommend patient try Flonase educated patient how to use it correctly patient  has been using but will try it in a different way patient will also try steroids as well patient understands this can make her more jittery and worsen her anxiety will take hydroxyzine to help with this.  -     CBC with Auto Differential  -     TSH with Reflex  -     Vitamin D 25 Hydroxy  -     Comprehensive Metabolic Panel  -     Vitamin J85 & Folate  2. Intractable chronic cluster headache patient's been having ongoing headaches.  Will refer patient to ENT or neurology based off of what her blood work show.  -     predniSONE (DELTASONE) 20 MG tablet; Take 2 tablets by mouth daily, Disp-10 tablet, R-0Normal  3. Screening cholesterol level  -     Lipid Panel           Electronically signed by Carma Lair, APRN - CNP on 11/01/20 at 1:30 PM EDT     This dictation was generated by voice recognition computer software. Although all attempts are made to edit the dictation for accuracy, there may be errors in the transcription that were not intended.

## 2020-11-01 NOTE — Patient Instructions (Signed)
[email or web address]

## 2020-11-02 LAB — LIPID PANEL
Cholesterol, Total: 238 mg/dL — ABNORMAL HIGH (ref 0–199)
HDL: 55 mg/dL (ref 40–60)
LDL Calculated: 142 mg/dL — ABNORMAL HIGH (ref <100)
Triglycerides: 203 mg/dL — ABNORMAL HIGH (ref 0–150)
VLDL Cholesterol Calculated: 41 mg/dL

## 2020-11-02 LAB — COMPREHENSIVE METABOLIC PANEL
ALT: 13 U/L (ref 10–40)
AST: 14 U/L — ABNORMAL LOW (ref 15–37)
Albumin/Globulin Ratio: 2.1 (ref 1.1–2.2)
Albumin: 4.6 g/dL (ref 3.4–5.0)
Alkaline Phosphatase: 43 U/L (ref 40–129)
Anion Gap: 12 (ref 3–16)
BUN: 13 mg/dL (ref 7–20)
CO2: 26 mmol/L (ref 21–32)
Calcium: 9.6 mg/dL (ref 8.3–10.6)
Chloride: 101 mmol/L (ref 99–110)
Creatinine: 0.9 mg/dL (ref 0.6–1.1)
Est, Glom Filt Rate: >60 (ref >60)
Glucose: 90 mg/dL (ref 70–99)
Potassium: 4.9 mmol/L (ref 3.5–5.1)
Sodium: 139 mmol/L (ref 136–145)
Total Bilirubin: <0.2 mg/dL (ref 0.0–1.0)
Total Protein: 6.8 g/dL (ref 6.4–8.2)

## 2020-11-02 LAB — CBC WITH AUTO DIFFERENTIAL
Basophils %: 0.5 %
Basophils Absolute: 0 10*3/uL (ref 0.0–0.2)
Eosinophils %: 5.4 %
Eosinophils Absolute: 0.3 10*3/uL (ref 0.0–0.6)
Hematocrit: 40.4 % (ref 36.0–48.0)
Hemoglobin: 14.1 g/dL (ref 12.0–16.0)
Lymphocytes %: 35.1 %
Lymphocytes Absolute: 2.1 10*3/uL (ref 1.0–5.1)
MCH: 30.4 pg (ref 26.0–34.0)
MCHC: 35 g/dL (ref 31.0–36.0)
MCV: 87.1 fL (ref 80.0–100.0)
MPV: 10 fL (ref 5.0–10.5)
Monocytes %: 6.7 %
Monocytes Absolute: 0.4 10*3/uL (ref 0.0–1.3)
Neutrophils %: 52.3 %
Neutrophils Absolute: 3.2 10*3/uL (ref 1.7–7.7)
Platelets: 243 10*3/uL (ref 135–450)
RBC: 4.64 M/uL (ref 4.00–5.20)
RDW: 13.3 % (ref 12.4–15.4)
WBC: 6.1 10*3/uL (ref 4.0–11.0)

## 2020-11-02 LAB — TSH WITH REFLEX: TSH: 4.31 u[IU]/mL — ABNORMAL HIGH (ref 0.27–4.20)

## 2020-11-02 LAB — T4, FREE: T4 Free: 1.3 ng/dL (ref 0.9–1.8)

## 2020-11-02 LAB — VITAMIN D 25 HYDROXY: Vit D, 25-Hydroxy: 73 ng/mL (ref >=30)

## 2020-11-02 NOTE — Telephone Encounter (Signed)
Please advise patient of test results question

## 2020-11-04 LAB — VITAMIN B12 & FOLATE
Folate: 18.58 ng/mL (ref 4.78–24.20)
Vitamin B-12: 615 pg/mL (ref 211–911)

## 2020-11-05 ENCOUNTER — Encounter

## 2020-11-05 NOTE — Telephone Encounter (Signed)
From: Gabriel Earing  Sent: 11/05/2020 1:38 PM EDT  To: [facility] Clinical Support  Subject: Question regarding COMPREHENSIVE METABOLIC PANEL    Good afternoon. I hope you had a nice weekend. I have been feeling a little bit better since taking the steroids but I???d say I???m back to about my regular baseline of dizziness. The steroids have definitely increased my amount of drainage so I think they helped a lot with the inflammation and things are starting to clear out. I???ve been having headaches but not taking anything for them like you said not to. I was curious if we can get me set up to speak with the ENT? Please let me know. Thanks!

## 2020-11-19 ENCOUNTER — Encounter

## 2020-11-20 ENCOUNTER — Ambulatory Visit
Admit: 2020-11-20 | Discharge: 2020-11-20 | Payer: PRIVATE HEALTH INSURANCE | Attending: Otolaryngology | Primary: Family

## 2020-11-20 ENCOUNTER — Ambulatory Visit: Admit: 2020-11-20 | Discharge: 2020-11-20 | Payer: PRIVATE HEALTH INSURANCE | Attending: Audiologist | Primary: Family

## 2020-11-20 DIAGNOSIS — H9011 Conductive hearing loss, unilateral, right ear, with unrestricted hearing on the contralateral side: Secondary | ICD-10-CM

## 2020-11-20 DIAGNOSIS — R42 Dizziness and giddiness: Secondary | ICD-10-CM

## 2020-11-20 NOTE — Progress Notes (Signed)
[facility]  [address], [address]  [place], [place] [address]  P: [phone]  F: [phone]       Patient     Vickie Wallace  1990/07/13    ChiefComplaint     Chief Complaint   Patient presents with    New Patient     Patient is here today because she has been battling dizziness for the past 3 years, she starts out ok but as the day progresses the dizziness will become worse and when that happens it will not subside until the next day, driving and the grocery store will trigger the dizziness       History of Present Illness     Vickie Wallace is a pleasant 30 y.o. female who presents as a new consultation referred by her primary care physician for dizziness.  The patient has been experiencing dizziness which she describes as a sensation of motion, rocking, instability for the past 3 years.  She is seeing an outside ENT, neurologist and cardiologist.  She had underwent an MRI of her brain which was essentially normal per the patient.  The dizziness is relieved by sleeping.  Typically will occur every day at some time throughout the day.  I will get worse from that point until she sleeps again.  She does have associated headaches 3-4 times a week.  She occasionally will have some nausea.  She does admit to photophobia and phonophobia.  Denies any tinnitus, otalgia, otorrhea.  Denies any head trauma.  She did experience an ear infection of the right ear few years ago.  Since then, she has had some diminished hearing in that ear.  Denies any history of ear surgeries.  Denies family history of ear issues.    Past Medical History     Past Medical History:   Diagnosis Date    Anxiety     Depression     Insomnia        Past Surgical History     No past surgical history on file.    Family History     Family History   Problem Relation Age of Onset    No Known Problems Mother     Other Father     Heart Attack Father         heart 64    No Known Problems Brother     Diabetes Maternal  Grandmother     Cancer Maternal Grandfather     Other Paternal Grandfather        Social History     Social History     Socioeconomic History    Marital status: Single     Spouse name: Not on file    Number of children: Not on file    Years of education: Not on file    Highest education level: Not on file   Occupational History    Not on file   Tobacco Use    Smoking status: Every Day     Passive exposure: Current    Smokeless tobacco: Never    Tobacco comments:     Vapes nicotene daily   Vaping Use    Vaping Use: Every day    Substances: Nicotine    Devices: Pre-filled or refillable cartridge   Substance and Sexual Activity    Alcohol use: Not Currently     Comment: daily    Drug use: No    Sexual activity: Not Currently     Partners:  Male   Other Topics Concern    Not on file   Social History Narrative    Not on file     Social Determinants of Health     Financial Resource Strain: Low Risk     Difficulty of Paying Living Expenses: Not hard at all   Food Insecurity: No Food Insecurity    Worried About Running Out of Food in the Last Year: Never true    Ran Out of Food in the Last Year: Never true   Transportation Needs: Not on file   Physical Activity: Not on file   Stress: Not on file   Social Connections: Not on file   Intimate Partner Violence: Not on file   Housing Stability: Not on file       Allergies     No Known Allergies    Medications     Current Outpatient Medications   Medication Sig Dispense Refill    predniSONE (DELTASONE) 20 MG tablet Take 2 tablets by mouth daily 10 tablet 0    Levonorgest-Eth Estrad 91-Day 0.1-0.02 & 0.01 MG TABS TAKE 1 TABLET BY MOUTH EVERY DAY 91 tablet 2    sertraline (ZOLOFT) 25 MG tablet Take 1 tablet by mouth in the morning. 90 tablet 1    traZODone (DESYREL) 150 MG tablet TAKE 1 TABLET BY MOUTH EVERY DAY AT NIGHT 90 tablet 1    hydrOXYzine (ATARAX) 25 MG tablet Take 1 tablet by mouth every 4 hours as needed for Anxiety 90 tablet 0     No current facility-administered  medications for this visit.       Review of Systems     Review of Systems   Constitutional:  Negative for activity change, appetite change, chills, diaphoresis, fatigue, fever and unexpected weight change.   HENT:  Positive for hearing loss (right). Negative for congestion, dental problem, drooling, ear discharge, ear pain, facial swelling, mouth sores, nosebleeds, postnasal drip, rhinorrhea, sinus pressure, sinus pain, sneezing, sore throat, tinnitus, trouble swallowing and voice change.    Eyes:  Negative for photophobia, discharge, itching and visual disturbance.   Respiratory:  Negative for cough, choking, shortness of breath, wheezing and stridor.    Gastrointestinal:  Negative for blood in stool, constipation, diarrhea, nausea and vomiting.   Endocrine: Negative for cold intolerance, heat intolerance, polyphagia and polyuria.   Musculoskeletal:  Negative for back pain, gait problem, neck pain and neck stiffness.   Skin:  Negative for color change, pallor, Wallace and wound.   Neurological:  Positive for dizziness and headaches. Negative for syncope, facial asymmetry, speech difficulty, light-headedness and numbness.   Hematological:  Negative for adenopathy. Does not bruise/bleed easily.   Psychiatric/Behavioral:  Negative for agitation, confusion and sleep disturbance.        PhysicalExam     Vitals:    11/20/20 1012   BP: 110/74   Pulse: (!) 45       Physical Exam  Constitutional:       Appearance: She is well-developed.   HENT:      Head: Normocephalic and atraumatic. Not macrocephalic and not microcephalic. No raccoon eyes, Battle's sign, abrasion, contusion, right periorbital erythema, left periorbital erythema or laceration. Hair is normal.      Jaw: No trismus.      Right Ear: Hearing, tympanic membrane and external ear normal. No decreased hearing noted. No drainage, swelling or tenderness. No middle ear effusion. No mastoid tenderness. Tympanic membrane is not perforated, retracted or bulging. Tympanic  membrane  has normal mobility.      Left Ear: Hearing, tympanic membrane and external ear normal. No decreased hearing noted. No drainage, swelling or tenderness.  No middle ear effusion. No mastoid tenderness. Tympanic membrane is not perforated, retracted or bulging. Tympanic membrane has normal mobility.      Nose: No nasal deformity, septal deviation, laceration, mucosal edema or rhinorrhea.      Right Sinus: No maxillary sinus tenderness or frontal sinus tenderness.      Left Sinus: No maxillary sinus tenderness or frontal sinus tenderness.      Mouth/Throat:      Mouth: Mucous membranes are not pale, not dry and not cyanotic. No lacerations or oral lesions.      Dentition: Normal dentition. No dental caries or dental abscesses.      Pharynx: Uvula midline. No oropharyngeal exudate, posterior oropharyngeal erythema or uvula swelling.      Tonsils: No tonsillar abscesses.   Eyes:      General: Lids are normal.         Right eye: No discharge.         Left eye: No discharge.      Extraocular Movements:      Right eye: Normal extraocular motion and no nystagmus.      Left eye: Normal extraocular motion and no nystagmus.      Conjunctiva/sclera:      Right eye: No chemosis or exudate.     Left eye: No chemosis or exudate.  Neck:      Thyroid: No thyroid mass or thyromegaly.      Vascular: Normal carotid pulses.      Trachea: No tracheal tenderness or tracheal deviation.   Cardiovascular:      Rate and Rhythm: Normal rate and regular rhythm.   Pulmonary:      Effort: No tachypnea, bradypnea or respiratory distress.      Breath sounds: No stridor.   Musculoskeletal:      Right shoulder: Normal range of motion.      Cervical back: Neck supple.   Lymphadenopathy:      Head:      Right side of head: No submental, submandibular, tonsillar, preauricular, posterior auricular or occipital adenopathy.      Left side of head: No submental, submandibular, tonsillar, preauricular, posterior auricular or occipital adenopathy.       Cervical: No cervical adenopathy.      Right cervical: No superficial, deep or posterior cervical adenopathy.     Left cervical: No superficial, deep or posterior cervical adenopathy.   Skin:     General: Skin is warm and dry.      Findings: No bruising, erythema, laceration, lesion or Wallace.   Neurological:      Mental Status: She is alert and oriented to person, place, and time.      Cranial Nerves: No cranial nerve deficit.      Comments: Functional neuro exam performed.  Dix-Hallpike negative bilaterally.  Finger-nose normal.  Cranial nerves II to XII gross intact.  Romberg negative.  Fukuda step test normal   Psychiatric:         Speech: Speech normal.         Behavior: Behavior normal.         Procedure     Otomicroscopy    An operating microscope was utilized to visualize the external auditory canals using a 41mm speculum. The external auditory canals are clear. The tympanic membrane is intact. Ossicles appear intact. No fluid  visualized in the middle ear.        Assessment and Plan     1. Dizziness  Audiogram performed the office today reveals right-sided mild low-frequency conductive loss returning to normal around 2000 Hz with a absent ipsilateral acoustic reflex.  Left-sided normal hearing.  Type a tympanograms.  Excellent word recognition scores.  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    The etiology of the patient's dizziness is most consistent with vestibular migraine, especially given sensitivity in grocery stores into motion and other visual stimulation.  Additionally, she experiences headaches with light and sound sensitivity.  She does have a significant intake of peanut butter daily.  Additionally, she has caffeine every morning.  I encouraged her to cut out the caffeine and peanut butter.  Provided migraine handout.  Recommend supplementation with B2, co-Q10 and magnesium.  Risks of supplements  discussed.  Follow-up with me in 2 months to assess symptom improvement.  If symptoms persist, will consider migraine preventative medication.    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 Vestibular migraine        Return in about 2 months (around 01/20/2021).      Portions of this note were dictated using Dragon. There may be linguistic errors secondary to the use of this program.

## 2020-11-20 NOTE — Progress Notes (Signed)
Vickie Wallace   [birth year], 30 y.o. female   [id number]       Referring Provider: Marrion Coy, DO  Referral Type: In an order in Epic    Reason for Visit: Evaluation of suspected change in hearing, tinnitus, or balance.      ADULT AUDIOLOGIC EVALUATION      Vickie Wallace is a 30 y.o. female seen today, 11/20/2020, for an initial audiologic evaluation.     AUDIOLOGIC AND OTHER PERTINENT MEDICAL HISTORY:        Vickie Wallace noted concern for dizziness, has been going on for about 3 years,  tinnitus that comes and goes; some fullness RE>LE; noted decreased hearing in right ear, had ear infection about 5-6 years ago and feels her hearing has been reduced since that time.      Vickie Wallace denied otalgia, otorrhea, history of occupational/recreational noise exposure, history of head trauma, history of ear surgery, and family history of hearing loss.    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  Hearing loss is significant enough to result in difficulty understanding speech in at least some listening environments.  Follow medical recommendations from Dr. Lucrezia Europe.    ASSESSMENT AND FINDINGS:       Otoscopy revealed: Clear ear canals bilaterally      RIGHT EAR:  Hearing Sensitivity: Mild conductive hearing loss through 1000 Hz rising to within normal limits.  Speech Recognition Threshold: 20 dBHL  Word Recognition: Excellent (100%), based on NU-6 25-word list at 45 dBHL using recorded speech stimuli.    Tympanometry: Normal peak pressure and compliance, Type A tympanogram, consistent with normal middle ear function.      LEFT EAR:  Hearing Sensitivity: Within normal limits.  Speech Recognition Threshold: 5 dBHL  Word Recognition: Excellent (100%), based on NU-6 25-word list at 45 dBHL using  recorded speech stimuli.    Tympanometry: Normal peak pressure and compliance, Type A tympanogram, consistent with normal middle ear function.      Reliability: Good  Transducer: Inserts    See scanned audiogram dated 11/20/2020 for results.      PATIENT EDUCATION:       The following items were discussed with the patient:   - Good Communication Strategies  - Tinnitus Management Strategies    - Dizziness    Educational information was shared in the After Visit Summary.                                                RECOMMENDATIONS:  The following items are recommended based on patient report and results from today's appointment:   - Continue medical follow-up with Marrion Coy, DO.   - Retest hearing as medically indicated and/or sooner if a change in hearing is noted.  - Utilize "Good Communication Strategies" as discussed to assist in speech understanding with communication partners.  - Maintain a sound enriched environment to assist in the management of tinnitus symptoms.  - If medically indicated, consider vestibular evaluation to further investigate symptoms of dizziness.         [facility] [employer], [place]  Audiologist       Chart CC'd to: Marrion Coy, DO      Degree of   Hearing Sensitivity dB Range   Within Normal Limits (WNL) 0 - 20   Mild 20 - 40   Moderate 40 - 55   Moderately-Severe 55 - 70   Severe 70 - 90   Profound 90 +

## 2020-11-20 NOTE — Patient Instructions (Signed)
Good Communication Strategies    Communication can be challenging for anyone, but can be especially difficult for those with some degree of hearing loss.  While we may not be able to control every factor that may lead to difficulty with communication, there are Good Communication Strategies that we can all use in our day-to-day lives.  Communication takes both parties working together for it to be successful.    Tips as a Listener:   Control your environment.  It is important to limit the amount of background noise in the room when possible.  You should also consider having a good light source in the room to best see the other person.  Ask for clarification.  Instead of saying "What?", you can use parts of what you heard to make a new question.  For example, if you heard the word "Thursday" but not the rest of the week, you may ask "What was that about Thursday?" or "What did you want to do Thursday?".  This shows the person talking that you are listening and will help them better explain what they are saying.  Be an advocate for yourself.  If you are hearing but not understanding, tell the other person "I can hear you, but I need you to slow down when you speak."  Or if someone is facing the other direction, say "I cannot hear you when you are not looking at me when we talk."       Tips as a Talker:   - Sit or stand 3 to 6 feet away to maximize audibility         -- It is unrealistic to believe someone else will fully hear your message if you are speaking from across the room or in a different room in the house   - Stay at eye level to help with visual cues   - Make sure you have the person???s attention before speaking   - Use facial expressions and gestures to accentuate your message   - Raise your voice slightly (do not scream)   - Speak slowly and distinctly   - Use short, simple sentences   - Rephrase your words if the person is having a hard time understanding you    - To avoid distortion, don???t speak directly  into a person???s ear      Some additional items that may be helpful:   - Remain patient - this is important for both parties   - Write down items that still cannot be heard/understood.  You may write with pen/paper or consider typing/texting on a cell phone or smart device.   - If background noise is unavoidable, try to keep yourself in a good position in the room.  By sitting at a booth on the side of the restaurant (preferably a corner), it will be easier to communicate than if you were sitting at a table in the middle with background noise surrounding you.  Keep yourself positioned away from music speakers or heavy foot traffic.   - If you have difficulty with the television, consider these options:      -- Use closed-captioning, which is a setting you can turn on that displays the spoken words in a written form on the screen.  There may be a slight delay, but this can help fill in missing information.  This can be especially helpful when watching programs with accented speech.      -- Consider use of a sound bar or speakers that   come from the front of the TV.  With modern flat screen TVs, many of them have speakers that come out of the back of the device, which makes sound bounce off the wall behind it, then go into the room.  Sound bars can allow the sound to go straight in your direction and can improve sound quality.      -- Consider ear level devices to help improve the volume and/or sound quality of the program.  There are devices that work like headphones that you can adjust the volume for your ears while others can have the volume at a more comfortable level, such as "TV Ears".  Most hearing aids have devices that allow them to connect directly to the TV and improve sound quality.     Tinnitus: Overview and Management Strategies          Many people have some ringing sounds in their ears once in a while. You may hear a roar, a hiss, a tinkle, or a buzz. The sound usually lasts only a few minutes. If it  goes on all the time, you may have tinnitus.    Tinnitus is usually caused by long-term exposure to loud noise. This damages the nerves in the inner ear. It can occur with all types of hearing loss. It may be a symptom of almost any ear problem.  Tinnitus may be caused by a buildup of earwax. Or, it may be caused by ear infections or certain medicines (especially antibiotics or large amounts of aspirin). You can also hear noises in your ears because of an injury to the ears, drinking too much alcohol or caffeine, or a medical condition.  Other conditions may also contribute to tinnitus, including: head and neck trauma, temporomandibular joint disorder (TMJ), sinus pressure and barometric trauma, traumatic brain injury, metabolic disorders, autoimmune disorders, stress, and high blood pressure.    You may need tests to evaluate your hearing and to find causes of long-lasting tinnitus.  Your doctor may suggest one or more treatments to help you cope with the tinnitus. You can also do things at home to help reduce symptoms.    Follow-up care is a key part of your treatment and safety. Be sure to make and go to all appointments, and call your doctor if you are having problems. It's also a good idea to know your test results and keep a list of the medicines you take.    How can you care for yourself at home?  Limit or cut out alcohol, caffeine, and sodium. They can make your symptoms worse.  Do not smoke or use other tobacco products. Nicotine reduces blood flow to the ear and makes tinnitus worse. If you need help quitting, talk to your doctor about stop-smoking programs and medicines. These can increase your chances of quitting for good.  Talk to your doctor about whether to stop taking aspirin and similar products such as ibuprofen or naproxen.  Get exercise often. It can help improve blood flow to the ear.    Ways to manage/cope with tinnitus  Some tinnitus may last a long time. To manage your tinnitus, try  to:  Avoid noises that you think caused your tinnitus. If you can't avoid loud noises, wear earplugs or earmuffs.  Ignore the sound by paying attention to other things.  Keeping your brain busy with other tasks or background noise can help your brain not focus on the tinnitus.  Try to not give the tinnitus an emotional reaction.    Do your best to ignore the sound and not let it bother you. Relax using biofeedback, meditation, or yoga. Feeling stressed and being tired can make tinnitus worse.  Play music or white noise to help you sleep.  Background noise may cover up the noise that you hear in your ears.  You can buy a tabletop machine or a device that sits under your pillow to play soothing sounds, like ocean waves.  Smart phones have free apps, such as Whist, Relax Melodies, ReSound Relief, and White Noise Lite.  These apps have different types of sounds/noise, some of which you can blend together to find sounds that are most soothing to you.  Hearing aid technology, especially when there is some hearing loss, may help reduce tinnitus symptoms by giving your brain better access to the sounds it is missing.  There are some hearing aids with built-in noise generator programs, which may help when amplification alone is not enough.        Additional resources may be found through the American Tinnitus Association at www.ata.org    When should you call for help?  Call 911 anytime you think you may need emergency care. For example, call if:    You have symptoms of a stroke. These may include:  Sudden numbness, tingling, weakness, or loss of movement in your face, arm, or leg, especially on only one side of your body.  Sudden vision changes.  Sudden trouble speaking.  Sudden confusion or trouble understanding simple statements.  Sudden problems with walking or balance.  A sudden, severe headache that is different from past headaches.    Call your doctor now or seek immediate medical care if:    You develop other symptoms.  These may include hearing loss (or worse hearing loss), balance problems, dizziness, nausea, or vomiting.    Watch closely for changes in your health, and be sure to contact your doctor if:    Your tinnitus moves from both ears to one ear.     Your hearing loss gets worse within 1 day after an ear injury.     Your tinnitus or hearing loss does not get better within 1 week after an ear injury.     Your tinnitus bothers you enough that you want to take medicines to help you cope with it.      If you notice changes in your tinnitus and/or your hearing, it is recommended that you have your hearing tested by your audiologist and to follow-up with your physician that manages your hearing loss (such as your ENT or Primary Care doctor).     Dizziness: Care Instructions  Your Care Instructions  Dizziness is the feeling of unsteadiness or fuzziness in your head. It is different than having vertigo, which is a feeling that the room is spinning or that you are moving or falling. It is also different from lightheadedness, which is the feeling that you are about to faint.    It can be hard to know what causes dizziness. Some people feel dizzy when they have migraine headaches. Sometimes bouts of flu can make you feel dizzy. Some medical conditions, such as heart problems or high blood pressure, can make you feel dizzy. Many medicines can cause dizziness, including medicines for high blood pressure, pain, or anxiety.    If a medicine causes your symptoms, your doctor may recommend that you stop or change the medicine. If it is a problem with your heart, you may need medicine to help your   heart work better. If there is no clear reason for your symptoms, your doctor may suggest watching and waiting for a while to see if the dizziness goes away on its own.    Follow-up care is a key part of your treatment and safety.     Be sure to make and go to all appointments, and call your doctor if you are having problems. It's also a good idea  to know your test results and keep a list of the medicines you take.    How can you care for yourself at home?    If your doctor recommends or prescribes medicine, take it exactly as directed. Call your doctor if you think you are having a problem with your medicine.  Do not drive while you feel dizzy.  Try to prevent falls. Steps you can take include:  Using nonskid mats, adding grab bars near the tub, and using night-lights.  Clearing your home so that walkways are free of anything you might trip on.  Letting family and friends know that you have been feeling dizzy. This will help them know how to help you.    When should you call for help?  Call 911 anytime you think you may need emergency care. For example, call if:    You passed out (lost consciousness).     You have dizziness along with symptoms of a heart attack. These may include:  Chest pain or pressure, or a strange feeling in the chest.  Sweating.  Shortness of breath.  Nausea or vomiting.  Pain, pressure, or a strange feeling in the back, neck, jaw, or upper belly or in one or both shoulders or arms.  Lightheadedness or sudden weakness.  A fast or irregular heartbeat.     You have symptoms of a stroke. These may include:  Sudden numbness, tingling, weakness, or loss of movement in your face, arm, or leg, especially on only one side of your body.  Sudden vision changes.  Sudden trouble speaking.  Sudden confusion or trouble understanding simple statements.  Sudden problems with walking or balance.  A sudden, severe headache that is different from past headaches.    Call your doctor now or seek immediate medical care if:    You feel dizzy and have a fever, headache, or ringing in your ears.     You have new or increased nausea and vomiting.     Your dizziness does not go away or comes back.    Watch closely for changes in your health, and be sure to contact your doctor if:    You do not get better as expected.   Where can you learn more?  Go to  [email or web address] and sign in to your MyChart account. Enter Q823 in the Search Health Information box to learn more about "Dizziness: Care Instructions."     If you do not have an account, please click on the "Sign Up Now" link.  Current as of: September 28, 2016  Content Version: 11.9  ?? 2006-2018 [employer]. Care instructions adapted under license by [facility]. If you have questions about a medical condition or this instruction, always ask your healthcare professional. Healthwise, Inc[employer] disclaims any warranty or liability for your use of this information.

## 2020-12-16 ENCOUNTER — Encounter

## 2020-12-17 NOTE — Telephone Encounter (Signed)
Routing to Dr. Vajen

## 2021-01-15 NOTE — Telephone Encounter (Signed)
Please advise patient on treatment plan

## 2021-01-16 ENCOUNTER — Encounter

## 2021-01-17 ENCOUNTER — Ambulatory Visit: Admit: 2021-01-17 | Discharge: 2021-01-18 | Payer: PRIVATE HEALTH INSURANCE | Primary: Family

## 2021-01-17 ENCOUNTER — Ambulatory Visit
Admit: 2021-01-17 | Discharge: 2021-01-17 | Payer: PRIVATE HEALTH INSURANCE | Attending: Orthopaedic Surgery | Primary: Family

## 2021-01-17 DIAGNOSIS — M25572 Pain in left ankle and joints of left foot: Secondary | ICD-10-CM

## 2021-01-17 NOTE — Progress Notes (Signed)
CHIEF COMPLAINT: Left ankle pain /distal tibia stress fracture      HISTORY:  [name] 31 y.o. Caucasian female presents today for consultation request from Carma Lair, APRN - CNP   for evaluation of a left ankle injury which occurred when she was walking and tripped. She is complaining of anterior ankle pain and swelling 5/10. This is better with elevation and worse with bearing any wt. she rates her pain a 5/10 VAS.  The pain is sharp and not radiating. No numbness or tingling sensation. Alleviating factors: rest. No other complaint.  She states when she was training for the flying pig half marathon last year she had increased pain in the anterior distal tibia.  It improved after she ran her brace because she was not running as much.  She is currently training for the full marathon flying pig.  She is able to run 3 to 4 miles without much pain.    Past Medical History:   Diagnosis Date    Anxiety     Depression     Insomnia        No past surgical history on file.    Social History     Socioeconomic History    Marital status: Single     Spouse name: Not on file    Number of children: Not on file    Years of education: Not on file    Highest education level: Not on file   Occupational History    Not on file   Tobacco Use    Smoking status: Former     Years: 3.00     Types: Cigarettes     Quit date: 01/03/2021     Years since quitting: 0.0     Passive exposure: Current    Smokeless tobacco: Never    Tobacco comments:     Vapes nicotene daily   Vaping Use    Vaping Use: Every day    Substances: Nicotine    Devices: Pre-filled or refillable cartridge   Substance and Sexual Activity    Alcohol use: Not Currently     Comment: daily    Drug use: No    Sexual activity: Not Currently     Partners: Male   Other Topics Concern    Not on file   Social History Narrative    Not on file     Social Determinants of Health     Financial Resource Strain: Low Risk     Difficulty of Paying Living Expenses: Not hard at  all   Food Insecurity: No Food Insecurity    Worried About Programme researcher, broadcasting/film/video in the Last Year: Never true    Barista in the Last Year: Never true   Transportation Needs: Not on file   Physical Activity: Sufficiently Active    Days of Exercise per Week: 6 days    Minutes of Exercise per Session: 40 min   Stress: Not on file   Social Connections: Not on file   Intimate Partner Violence: Not At Risk    Fear of Current or Ex-Partner: No    Emotionally Abused: No    Physically Abused: No    Sexually Abused: No   Housing Stability: Not on file       Family History   Problem Relation Age of Onset    No Known Problems Mother     Other Father     Heart Attack Father  heart 62    No Known Problems Brother     Diabetes Maternal Grandmother     Cancer Maternal Grandfather     Other Paternal Grandfather        Current Outpatient Medications on File Prior to Visit   Medication Sig Dispense Refill    Levonorgest-Eth Estrad 91-Day 0.1-0.02 & 0.01 MG TABS TAKE 1 TABLET BY MOUTH EVERY DAY 91 tablet 2    sertraline (ZOLOFT) 25 MG tablet Take 1 tablet by mouth in the morning. 90 tablet 1    traZODone (DESYREL) 150 MG tablet TAKE 1 TABLET BY MOUTH EVERY DAY AT NIGHT 90 tablet 1    hydrOXYzine (ATARAX) 25 MG tablet Take 1 tablet by mouth every 4 hours as needed for Anxiety 90 tablet 0     No current facility-administered medications on file prior to visit.       Pertinent items are noted in HPI  Review of systems reviewed from Patient History Form and available in the patient's chart under the Media tab.        PHYSICAL EXAMINATION:  Vickie Wallace is a very pleasant 31 y.o. Caucasian female who presents today in no acute distress, awake, alert, and oriented.  She is well dressed, nourished and  groomed.  Patient with normal affect.  Height is  5\' 6"  (1.676 m), weight is 138 lb (62.6 kg), Body mass index is 22.27 kg/m??.  Resting respiratory rate is 16.     Examination of the gait, showed that the patient walks  WB left  leg  Examination of both ankles showing a full range of motion of the left ankle compare to the other side.  There is no swelling that can be seen, no ecchymosis. She has intact sensation and good pedal pulses.  She has significant tenderness on deep palpation over the distal medial tibia compared to the other side.    She can dorsiflex 15 to degrees from neutral on the right ankle and 10 degrees from neutral on the left.  She has mild tenderness to palpation over the mid substance Achilles tendon left ankle.    IMAGING: Xrays, 3 views of the left ankle dated today in office,  were reviewed, and showed no acute fracture.      IMPRESSION: Left distal tibia stress fracture.    PLAN:  I discussed that no acute fracture was seen, but could not exclude a distal tibia stress fracture.  Recommend she avoid any heavy impact activities and avoid running at this point.  She was instructed to stretch her calf which was demonstrated to her today in office.  Recommend getting an MRI to further evaluate for a distal tibia stress fracture.  Follow-up results.    Thank you very much for the kind consultation and allowing me to participate in this patient's care.  I will continue to keep you apprised of her progress.        , MD

## 2021-01-18 NOTE — Telephone Encounter (Signed)
Left detailed voicemail notifying patient she can schedule her MRI and follow up in office.

## 2021-01-21 ENCOUNTER — Encounter: Payer: PRIVATE HEALTH INSURANCE | Primary: Family

## 2021-01-21 NOTE — Telephone Encounter (Signed)
Medical Facility Question     Facility Name: [facility]  Contact Name: OLIVE, ZMUDA Number: [phone]  Request or Information:       THE PT NEEDS AN ORDER FOR MRI OF HER LEFT ANKLE FAXED TO [facility] AT [phone]

## 2021-01-21 NOTE — Telephone Encounter (Signed)
Faxed order for MRI tib fib ordered on 1/12. NPR.

## 2021-01-24 ENCOUNTER — Ambulatory Visit
Admit: 2021-01-24 | Discharge: 2021-01-24 | Payer: PRIVATE HEALTH INSURANCE | Attending: Otolaryngology | Primary: Family

## 2021-01-24 DIAGNOSIS — R42 Dizziness and giddiness: Secondary | ICD-10-CM

## 2021-01-24 MED ORDER — NORTRIPTYLINE HCL 10 MG PO CAPS
10 MG | ORAL_CAPSULE | ORAL | 0 refills | Status: DC
Start: 2021-01-24 — End: 2021-02-19

## 2021-01-24 NOTE — Progress Notes (Signed)
[facility]  [address], [address]  [place], [place] [id number]  P: [phone]  F: [phone]       Patient     Vickie EaringKatherine Lafontant  01/14/1990    ChiefComplaint     Chief Complaint   Patient presents with    Follow-up     Patient is here today for her follow up on dizziness, the dizziness is about the same maybe a little better since taking the supplements        History of Present Illness     Vickie EaringKatherine Wallace is a pleasant 31 y.o. female here for 5838-month follow-up for suspected vestibular migraine.  She did not tolerate magnesium due to gastrointestinal issues.  She has been taking B2 and co-Q10.  She notices maybe a minor improvement of symptoms.  She has been experiencing headaches as well, sometimes with dermal pain.  Hearing feels stable.    Past Medical History     Past Medical History:   Diagnosis Date    Anxiety     Depression     Insomnia        Past Surgical History     No past surgical history on file.    Family History     Family History   Problem Relation Age of Onset    No Known Problems Mother     Other Father     Heart Attack Father         heart [id number]    No Known Problems Brother     Diabetes Maternal Grandmother     Cancer Maternal Grandfather     Other Paternal Grandfather        Social History     Social History     Socioeconomic History    Marital status: Single     Spouse name: Not on file    Number of children: Not on file    Years of education: Not on file    Highest education level: Not on file   Occupational History    Not on file   Tobacco Use    Smoking status: Former     Years: 3.00     Types: Cigarettes     Quit date: 01/03/2021     Years since quitting: 0.0     Passive exposure: Current    Smokeless tobacco: Never    Tobacco comments:     Vapes nicotene daily   Vaping Use    Vaping Use: Every day    Substances: Nicotine    Devices: Pre-filled or refillable cartridge   Substance and Sexual Activity    Alcohol use: Not Currently     Comment: daily    Drug use: No     Sexual activity: Not Currently     Partners: Male   Other Topics Concern    Not on file   Social History Narrative    Not on file     Social Determinants of Health     Financial Resource Strain: Low Risk     Difficulty of Paying Living Expenses: Not hard at all   Food Insecurity: No Food Insecurity    Worried About Programme researcher, broadcasting/film/videounning Out of Food in the Last Year: Never true    Ran Out of Food in the Last Year: Never true   Transportation Needs: Not on file   Physical Activity: Sufficiently Active    Days of Exercise per Week: 6 days    Minutes of Exercise per  Session: 40 min   Stress: Not on file   Social Connections: Not on file   Intimate Partner Violence: Not At Risk    Fear of Current or Ex-Partner: No    Emotionally Abused: No    Physically Abused: No    Sexually Abused: No   Housing Stability: Not on file       Allergies     No Known Allergies    Medications     Current Outpatient Medications   Medication Sig Dispense Refill    nortriptyline (PAMELOR) 10 MG capsule 1st week: x1 10 mg tablet nightly; 2nd week: x2 10 mg tablets nightly; 3rd week: x3 10 mg tabs nightly; 4th week: x4 10 mg tabs nightly 100 capsule 0    Levonorgest-Eth Estrad 91-Day 0.1-0.02 & 0.01 MG TABS TAKE 1 TABLET BY MOUTH EVERY DAY 91 tablet 2    sertraline (ZOLOFT) 25 MG tablet Take 1 tablet by mouth in the morning. 90 tablet 1    traZODone (DESYREL) 150 MG tablet TAKE 1 TABLET BY MOUTH EVERY DAY AT NIGHT 90 tablet 1    hydrOXYzine (ATARAX) 25 MG tablet Take 1 tablet by mouth every 4 hours as needed for Anxiety 90 tablet 0     No current facility-administered medications for this visit.       Review of Systems     Review of Systems   Constitutional:  Negative for chills, fatigue and fever.   HENT:  Negative for congestion, ear discharge, ear pain, facial swelling, hearing loss, nosebleeds, postnasal drip, rhinorrhea, sinus pressure, sinus pain, sneezing, sore throat, tinnitus, trouble swallowing and voice change.    Eyes:  Negative for photophobia,  pain, redness, itching and visual disturbance.   Respiratory:  Negative for cough, choking, shortness of breath and stridor.    Gastrointestinal:  Negative for diarrhea and nausea.   Musculoskeletal:  Negative for neck pain and neck stiffness.   Skin:  Negative for color change and rash.   Neurological:  Positive for dizziness and headaches. Negative for facial asymmetry and light-headedness.   Hematological:  Negative for adenopathy.   Psychiatric/Behavioral:  Negative for agitation and confusion.        PhysicalExam     Vitals:    01/24/21 0856   BP: 123/77   Pulse: 53       Physical Exam  Constitutional:       Appearance: She is well-developed.   HENT:      Head: Normocephalic and atraumatic.      Jaw: No trismus.      Right Ear: Tympanic membrane, ear canal and external ear normal. No drainage. No middle ear effusion. Tympanic membrane is not perforated.      Left Ear: Tympanic membrane, ear canal and external ear normal. No drainage.  No middle ear effusion. Tympanic membrane is not perforated.      Nose: No septal deviation, mucosal edema or rhinorrhea.      Mouth/Throat:      Dentition: Normal dentition.      Pharynx: Uvula midline. No oropharyngeal exudate.   Eyes:      General: No scleral icterus.        Right eye: No discharge.         Left eye: No discharge.      Pupils: Pupils are equal, round, and reactive to light.   Neck:      Thyroid: No thyromegaly.      Trachea: Phonation normal. No tracheal deviation.  Pulmonary:      Effort: Pulmonary effort is normal. No respiratory distress.      Breath sounds: No stridor.   Musculoskeletal:      Cervical back: Neck supple.   Lymphadenopathy:      Cervical: No cervical adenopathy.   Skin:     General: Skin is warm and dry.   Neurological:      Mental Status: She is alert and oriented to person, place, and time.      Cranial Nerves: No cranial nerve deficit.   Psychiatric:         Behavior: Behavior normal.         Procedure           Assessment and Plan     1.  Dizziness  Unfortunately, patient has persistent symptoms despite supplement therapy.  Discussed prescription options including propranolol, nortriptyline, topiramate.  Patient had bad withdrawal reaction to Lamictal and would like to avoid topiramate as an initial trial.  Additionally, she states she has a very low resting heart rate and blood pressure and would like to avoid beta-blocker.  Recommend trial of nortriptyline.  She is already on Zoloft and trazodone.  She does have increased risk for serotonin syndrome.  Proper administration and risks of medication discussed including serotonin syndrome symptoms.  If her PCP is in agreement, I would recommend discontinuing the Zoloft in place of the nortriptyline.  Escalation therapy discussed.  Prescription sent to pharmacy.  Follow-up in 1 month.  - nortriptyline (PAMELOR) 10 MG capsule; 1st week: x1 10 mg tablet nightly; 2nd week: x2 10 mg tablets nightly; 3rd week: x3 10 mg tabs nightly; 4th week: x4 10 mg tabs nightly  Dispense: 100 capsule; Refill: 0    2. Vestibular migraine    - nortriptyline (PAMELOR) 10 MG capsule; 1st week: x1 10 mg tablet nightly; 2nd week: x2 10 mg tablets nightly; 3rd week: x3 10 mg tabs nightly; 4th week: x4 10 mg tabs nightly  Dispense: 100 capsule; Refill: 0    3. Conductive hearing loss of right ear with unrestricted hearing of left ear      Return in about 4 weeks (around 02/21/2021).    [X]  medication management  [ ]  Review/order radiology tests   [ ]  Independent interpretation of diagnostic test by another provider  [ ]  Discussed case with another provider  [ ]  High risk of loss of major body function  [ ]  Elective major surgery with risk factors    Portions of this note were dictated using Dragon. There may be linguistic errors secondary to the use of this program.

## 2021-02-07 ENCOUNTER — Encounter: Payer: PRIVATE HEALTH INSURANCE | Attending: Orthopaedic Surgery | Primary: Family

## 2021-02-17 ENCOUNTER — Encounter

## 2021-02-19 MED ORDER — NORTRIPTYLINE HCL 50 MG PO CAPS
50 MG | ORAL_CAPSULE | Freq: Every evening | ORAL | 0 refills | Status: DC
Start: 2021-02-19 — End: 2021-03-07

## 2021-03-07 MED ORDER — NORTRIPTYLINE HCL 10 MG PO CAPS
10 MG | ORAL_CAPSULE | ORAL | 0 refills | Status: DC
Start: 2021-03-07 — End: 2021-04-25

## 2021-03-07 NOTE — Progress Notes (Signed)
[facility]  [address], [address]  [place], [place] [address]  P: [phone]  F: [phone]       Patient     Vickie Wallace  [birth year]    ChiefComplaint     Chief Complaint   Patient presents with    Follow-up     Patient is here today for her 1 month follow up for vertigo, she states that her dizziness is better but she does not want to take the nortriptyline because she is in training and her heart rate has increased, muscle fatigue, anxiety       History of Present Illness     Vickie Wallace is a pleasant 31 y.o. female here for 1 month follow-up for vestibular migraine.  She discontinued SSRI and titrated up nortriptyline to 50 mg nightly.  She has noticed significant increase in body fatigue.  She feels somewhat dissociated.  This is impeding her training for marathon.  Her resting heart rate has gone up 20 to 30 bpm since going off SSRI and starting nortriptyline.  She would like to de-escalate therapy and try something else.  That being said, she has noticed some improvement of her dizziness.    Past Medical History     Past Medical History:   Diagnosis Date    Anxiety     Depression     Insomnia        Past Surgical History     No past surgical history on file.    Family History     Family History   Problem Relation Age of Onset    No Known Problems Mother     Other Father     Heart Attack Father         heart 29    No Known Problems Brother     Diabetes Maternal Grandmother     Cancer Maternal Grandfather     Other Paternal Grandfather        Social History     Social History     Socioeconomic History    Marital status: Single     Spouse name: Not on file    Number of children: Not on file    Years of education: Not on file    Highest education level: Not on file   Occupational History    Not on file   Tobacco Use    Smoking status: Former     Years: 3.00     Types: Cigarettes     Quit date: 01/03/2021     Years since quitting: 0.1     Passive exposure: Current     Smokeless tobacco: Never    Tobacco comments:     Vapes nicotene daily   Vaping Use    Vaping Use: Every day    Substances: Nicotine    Devices: Pre-filled or refillable cartridge   Substance and Sexual Activity    Alcohol use: Not Currently     Comment: daily    Drug use: No    Sexual activity: Not Currently     Partners: Male   Other Topics Concern    Not on file   Social History Narrative    Not on file     Social Determinants of Health     Financial Resource Strain: Low Risk     Difficulty of Paying Living Expenses: Not hard at all   Food Insecurity: No Food Insecurity    Worried About Running Out of  Food in the Last Year: Never true    Ran Out of Food in the Last Year: Never true   Transportation Needs: Not on file   Physical Activity: Sufficiently Active    Days of Exercise per Week: 6 days    Minutes of Exercise per Session: 40 min   Stress: Not on file   Social Connections: Not on file   Intimate Partner Violence: Not At Risk    Fear of Current or Ex-Partner: No    Emotionally Abused: No    Physically Abused: No    Sexually Abused: No   Housing Stability: Not on file       Allergies     No Known Allergies    Medications     Current Outpatient Medications   Medication Sig Dispense Refill    nortriptyline (PAMELOR) 10 MG capsule Take 4 capsules by mouth nightly for 7 days, THEN 3 capsules nightly for 7 days, THEN 2 capsules nightly for 7 days, THEN 1 capsule nightly for 7 days. 70 capsule 0    Levonorgest-Eth Estrad 91-Day 0.1-0.02 & 0.01 MG TABS TAKE 1 TABLET BY MOUTH EVERY DAY 91 tablet 2    sertraline (ZOLOFT) 25 MG tablet Take 1 tablet by mouth in the morning. 90 tablet 1    traZODone (DESYREL) 150 MG tablet TAKE 1 TABLET BY MOUTH EVERY DAY AT NIGHT 90 tablet 1    hydrOXYzine (ATARAX) 25 MG tablet Take 1 tablet by mouth every 4 hours as needed for Anxiety 90 tablet 0     No current facility-administered medications for this visit.       Review of Systems     Review of Systems   Constitutional:  Positive  for fatigue. Negative for chills and fever.   HENT:  Negative for congestion, ear discharge, ear pain, facial swelling, hearing loss, nosebleeds, postnasal drip, rhinorrhea, sinus pressure, sinus pain, sneezing, sore throat, tinnitus, trouble swallowing and voice change.    Eyes:  Negative for photophobia, pain, redness, itching and visual disturbance.   Respiratory:  Negative for cough, choking, shortness of breath and stridor.    Gastrointestinal:  Negative for diarrhea and nausea.   Musculoskeletal:  Negative for neck pain and neck stiffness.   Skin:  Negative for color change and rash.   Neurological:  Negative for dizziness, facial asymmetry and light-headedness.   Hematological:  Negative for adenopathy.   Psychiatric/Behavioral:  Negative for agitation and confusion.        PhysicalExam     Vitals:    03/07/21 0903   BP: 115/77   Pulse: 76   Temp: 98.6 ??F (37 ??C)       Physical Exam  Constitutional:       Appearance: She is well-developed.   HENT:      Head: Normocephalic and atraumatic.      Jaw: No trismus.      Right Ear: Tympanic membrane, ear canal and external ear normal. No drainage. No middle ear effusion. Tympanic membrane is not perforated.      Left Ear: Tympanic membrane, ear canal and external ear normal. No drainage.  No middle ear effusion. Tympanic membrane is not perforated.      Nose: No septal deviation, mucosal edema or rhinorrhea.      Mouth/Throat:      Dentition: Normal dentition.      Pharynx: Uvula midline. No oropharyngeal exudate.   Eyes:      General: No scleral icterus.  Right eye: No discharge.         Left eye: No discharge.      Pupils: Pupils are equal, round, and reactive to light.   Neck:      Thyroid: No thyromegaly.      Trachea: Phonation normal. No tracheal deviation.   Pulmonary:      Effort: Pulmonary effort is normal. No respiratory distress.      Breath sounds: No stridor.   Musculoskeletal:      Cervical back: Neck supple.   Lymphadenopathy:      Cervical: No  cervical adenopathy.   Skin:     General: Skin is warm and dry.   Neurological:      Mental Status: She is alert and oriented to person, place, and time.      Cranial Nerves: No cranial nerve deficit.   Psychiatric:         Behavior: Behavior normal.         Procedure           Assessment and Plan     1. Vestibular migraine  Due to side effects of weakness, feeling of dissociation, increased heart rate, will de-escalate nortriptyline therapy.  Follow-up in 1 month.  If resting heart rate is within a reasonable range, will likely initiate beta-blocker prevention therapy.  Patient is in agreement with this plan.  - nortriptyline (PAMELOR) 10 MG capsule; Take 4 capsules by mouth nightly for 7 days, THEN 3 capsules nightly for 7 days, THEN 2 capsules nightly for 7 days, THEN 1 capsule nightly for 7 days.  Dispense: 70 capsule; Refill: 0    2. Dizziness    - nortriptyline (PAMELOR) 10 MG capsule; Take 4 capsules by mouth nightly for 7 days, THEN 3 capsules nightly for 7 days, THEN 2 capsules nightly for 7 days, THEN 1 capsule nightly for 7 days.  Dispense: 70 capsule; Refill: 0    Return in about 4 weeks (around 04/04/2021).    [X]  medication management  [ ]  Review/order radiology tests   [ ]  Independent interpretation of diagnostic test by another provider  [ ]  Discussed case with another provider  [ ]  High risk of loss of major body function  [ ]  Elective major surgery with risk factors    Portions of this note were dictated using Dragon. There may be linguistic errors secondary to the use of this program.

## 2021-03-15 ENCOUNTER — Encounter

## 2021-03-15 MED ORDER — SERTRALINE HCL 25 MG PO TABS
25 MG | ORAL_TABLET | ORAL | 1 refills | Status: DC
Start: 2021-03-15 — End: 2021-07-04

## 2021-03-15 NOTE — Telephone Encounter (Signed)
Medication:   Requested Prescriptions     Pending Prescriptions Disp Refills    sertraline (ZOLOFT) 25 MG tablet [Pharmacy Med Name: SERTRALINE HCL 25 MG TABLET] 90 tablet 1     Sig: TAKE 1 TABLET BY MOUTH EVERY DAY IN THE MORNING        Last Filled:  [id number]    Patient Phone Number: [phone] (home)     Last appt: 11/01/2020   Next appt: Visit date not found    Last OARRS: No flowsheet data found.

## 2021-03-27 ENCOUNTER — Encounter

## 2021-03-28 ENCOUNTER — Ambulatory Visit
Admit: 2021-03-28 | Discharge: 2021-03-28 | Payer: PRIVATE HEALTH INSURANCE | Attending: Otolaryngology | Primary: Family

## 2021-03-28 DIAGNOSIS — G43809 Other migraine, not intractable, without status migrainosus: Secondary | ICD-10-CM

## 2021-03-28 MED ORDER — NADOLOL 20 MG PO TABS
20 MG | ORAL_TABLET | Freq: Every day | ORAL | 1 refills | Status: DC
Start: 2021-03-28 — End: 2021-04-25

## 2021-03-28 NOTE — Progress Notes (Signed)
[facility]  [address], [address]  [place], [place] [address]  P: [phone]  F: [phone]       Patient     Vickie Wallace  Aug 23, 1990    ChiefComplaint     Chief Complaint   Patient presents with    Follow-up     Patient is here today for her follow up on her vertigo and to retest her heart rate due to the side effects of the medication       History of Present Illness     Vickie Wallace is a pleasant 31 y.o. female here for 1 month follow-up vestibular migraine.  She has spent the last month titrating off nortriptyline.  She has noticed a improvement of her fatigue, dissociation feeling and muscle aches.  However, she has noticed a return of the dizzy symptoms.  She is interested in trying a beta-blocker.  She does have a history of depression.  No history of asthma.    Past Medical History     Past Medical History:   Diagnosis Date    Anxiety     Depression     Insomnia        Past Surgical History     No past surgical history on file.    Family History     Family History   Problem Relation Age of Onset    No Known Problems Mother     Other Father     Heart Attack Father         heart 56    No Known Problems Brother     Diabetes Maternal Grandmother     Cancer Maternal Grandfather     Other Paternal Grandfather        Social History     Social History     Socioeconomic History    Marital status: Single     Spouse name: Not on file    Number of children: Not on file    Years of education: Not on file    Highest education level: Not on file   Occupational History    Not on file   Tobacco Use    Smoking status: Former     Years: 3.00     Types: Cigarettes     Quit date: 01/03/2021     Years since quitting: 0.2     Passive exposure: Current    Smokeless tobacco: Never    Tobacco comments:     Vapes nicotene daily   Vaping Use    Vaping Use: Every day    Substances: Nicotine    Devices: Pre-filled or refillable cartridge   Substance and Sexual Activity    Alcohol use: Not Currently      Comment: daily    Drug use: No    Sexual activity: Not Currently     Partners: Male   Other Topics Concern    Not on file   Social History Narrative    Not on file     Social Determinants of Health     Financial Resource Strain: Low Risk     Difficulty of Paying Living Expenses: Not hard at all   Food Insecurity: No Food Insecurity    Worried About Programme researcher, broadcasting/film/video in the Last Year: Never true    Ran Out of Food in the Last Year: Never true   Transportation Needs: Not on file   Physical Activity: Sufficiently Active    Days of  Exercise per Week: 6 days    Minutes of Exercise per Session: 40 min   Stress: Not on file   Social Connections: Not on file   Intimate Partner Violence: Not At Risk    Fear of Current or Ex-Partner: No    Emotionally Abused: No    Physically Abused: No    Sexually Abused: No   Housing Stability: Not on file       Allergies     No Known Allergies    Medications     Current Outpatient Medications   Medication Sig Dispense Refill    nadolol (CORGARD) 20 MG tablet Take 1 tablet by mouth daily 30 tablet 1    sertraline (ZOLOFT) 25 MG tablet TAKE 1 TABLET BY MOUTH EVERY DAY IN THE MORNING 90 tablet 1    nortriptyline (PAMELOR) 10 MG capsule Take 4 capsules by mouth nightly for 7 days, THEN 3 capsules nightly for 7 days, THEN 2 capsules nightly for 7 days, THEN 1 capsule nightly for 7 days. 70 capsule 0    Levonorgest-Eth Estrad 91-Day 0.1-0.02 & 0.01 MG TABS TAKE 1 TABLET BY MOUTH EVERY DAY 91 tablet 2    traZODone (DESYREL) 150 MG tablet TAKE 1 TABLET BY MOUTH EVERY DAY AT NIGHT 90 tablet 1    hydrOXYzine (ATARAX) 25 MG tablet Take 1 tablet by mouth every 4 hours as needed for Anxiety 90 tablet 0     No current facility-administered medications for this visit.       Review of Systems     Review of Systems   Constitutional:  Negative for chills, fatigue and fever.   HENT:  Negative for congestion, ear discharge, ear pain, facial swelling, hearing loss, nosebleeds, postnasal drip, rhinorrhea,  sinus pressure, sinus pain, sneezing, sore throat, tinnitus, trouble swallowing and voice change.    Eyes:  Negative for photophobia, pain, redness, itching and visual disturbance.   Respiratory:  Negative for cough, choking, shortness of breath and stridor.    Gastrointestinal:  Negative for diarrhea and nausea.   Musculoskeletal:  Negative for neck pain and neck stiffness.   Skin:  Negative for color change and rash.   Neurological:  Positive for dizziness. Negative for facial asymmetry and light-headedness.   Hematological:  Negative for adenopathy.   Psychiatric/Behavioral:  Negative for agitation and confusion.        PhysicalExam     Vitals:    03/28/21 1032   BP: 114/83   Pulse: 56   Temp: 98.4 ??F (36.9 ??C)   SpO2: 99%       Physical Exam  Constitutional:       Appearance: She is well-developed.   HENT:      Head: Normocephalic and atraumatic.      Jaw: No trismus.      Right Ear: Tympanic membrane, ear canal and external ear normal. No drainage. No middle ear effusion. Tympanic membrane is not perforated.      Left Ear: Tympanic membrane, ear canal and external ear normal. No drainage.  No middle ear effusion. Tympanic membrane is not perforated.      Nose: No septal deviation, mucosal edema or rhinorrhea.      Mouth/Throat:      Dentition: Normal dentition.      Pharynx: Uvula midline. No oropharyngeal exudate.   Eyes:      General: No scleral icterus.        Right eye: No discharge.  Left eye: No discharge.      Pupils: Pupils are equal, round, and reactive to light.   Neck:      Thyroid: No thyromegaly.      Trachea: Phonation normal. No tracheal deviation.   Pulmonary:      Effort: Pulmonary effort is normal. No respiratory distress.      Breath sounds: No stridor.   Musculoskeletal:      Cervical back: Neck supple.   Lymphadenopathy:      Cervical: No cervical adenopathy.   Skin:     General: Skin is warm and dry.   Neurological:      Mental Status: She is alert and oriented to person, place, and  time.      Cranial Nerves: No cranial nerve deficit.   Psychiatric:         Behavior: Behavior normal.         Procedure           Assessment and Plan     1. Vestibular migraine  The patient did notice improvement of dizzy symptoms with nortriptyline, but did not tolerate the brain fog, dissociated feeling, fatigue and muscle aches.  She has been de-escalating nortriptyline therapy and currently has 3 days left of the medication.  Given her history of depression, I recommend doing a trial of nadolol.  We will begin with 20 mg daily, titrate up as needed for symptom control.  Will monitor vitals.  Vitals are currently stable at this time and she should tolerate the medication okay.  Decision to use nadolol over propranolol due to history of depression.  Follow-up in 1 month.  Risks of medication discussed.  - nadolol (CORGARD) 20 MG tablet; Take 1 tablet by mouth daily  Dispense: 30 tablet; Refill: 1    2. Dizziness    - nadolol (CORGARD) 20 MG tablet; Take 1 tablet by mouth daily  Dispense: 30 tablet; Refill: 1    3. Conductive hearing loss of right ear with unrestricted hearing of left ear      Return in about 4 weeks (around 04/25/2021).      [ ]  Review/order radiology tests   [ ]  Independent interpretation of diagnostic test by another provider  [ ]  Discussed case with another provider  [ ]  High risk of loss of major body function  [ ]  Elective major surgery with risk factors    Portions of this note were dictated using Dragon. There may be linguistic errors secondary to the use of this program.

## 2021-04-09 NOTE — Telephone Encounter (Signed)
Routing to Dr. Vajen

## 2021-04-19 ENCOUNTER — Encounter

## 2021-04-25 ENCOUNTER — Encounter
Admit: 2021-04-25 | Discharge: 2021-04-25 | Payer: PRIVATE HEALTH INSURANCE | Attending: Otolaryngology | Primary: Family

## 2021-04-25 DIAGNOSIS — G43809 Other migraine, not intractable, without status migrainosus: Secondary | ICD-10-CM

## 2021-04-25 MED ORDER — NADOLOL 20 MG PO TABS
20 MG | ORAL_TABLET | ORAL | 1 refills | Status: DC
Start: 2021-04-25 — End: 2021-05-20

## 2021-04-25 NOTE — Progress Notes (Signed)
[facility]  [address], [address]  [place], [place] [id number]  P: [phone]  F: [phone]       Patient     Vickie EaringKatherine Binford  02/21/1990    ChiefComplaint     Chief Complaint   Patient presents with    Follow-up     Patient is here today for his 1 month follow up, she states that she is still pretty dizzy       History of Present Illness     Vickie EaringKatherine Sibilia is a pleasant 31 y.o. female here for 1 month follow-up for vestibular migraine and dizziness.  She has begun nadolol 20 mg daily.  It does cause some mild fatigue.  She has not noticed any improvement of her dizzy symptoms.  She has since discontinued nortriptyline.  She does not want to increase any doses or change medications at this time as she has a marathon coming up in the next few weeks.  She states her resting heart rate at home is in the mid 40s.  She has noticed improvement of occasional heart palpitations since starting the nadolol.    Past Medical History     Past Medical History:   Diagnosis Date    Anxiety     Depression     Insomnia        Past Surgical History     No past surgical history on file.    Family History     Family History   Problem Relation Age of Onset    No Known Problems Mother     Other Father     Heart Attack Father         heart [id number]    No Known Problems Brother     Diabetes Maternal Grandmother     Cancer Maternal Grandfather     Other Paternal Grandfather        Social History     Social History     Socioeconomic History    Marital status: Single     Spouse name: Not on file    Number of children: Not on file    Years of education: Not on file    Highest education level: Not on file   Occupational History    Not on file   Tobacco Use    Smoking status: Former     Years: 3.00     Types: Cigarettes     Quit date: 01/03/2021     Years since quitting: 0.3     Passive exposure: Current    Smokeless tobacco: Never    Tobacco comments:     Vapes nicotene daily   Vaping Use    Vaping Use: Every day     Substances: Nicotine    Devices: Pre-filled or refillable cartridge   Substance and Sexual Activity    Alcohol use: Not Currently     Comment: daily    Drug use: No    Sexual activity: Not Currently     Partners: Male   Other Topics Concern    Not on file   Social History Narrative    Not on file     Social Determinants of Health     Financial Resource Strain: Low Risk     Difficulty of Paying Living Expenses: Not hard at all   Food Insecurity: No Food Insecurity    Worried About Radiation protection practitionerunning Out of Food in the Last Year: Never true    Ran Out  of Food in the Last Year: Never true   Transportation Needs: Not on file   Physical Activity: Sufficiently Active    Days of Exercise per Week: 6 days    Minutes of Exercise per Session: 40 min   Stress: Not on file   Social Connections: Not on file   Intimate Partner Violence: Not At Risk    Fear of Current or Ex-Partner: No    Emotionally Abused: No    Physically Abused: No    Sexually Abused: No   Housing Stability: Not on file       Allergies     No Known Allergies    Medications     Current Outpatient Medications   Medication Sig Dispense Refill    sertraline (ZOLOFT) 25 MG tablet TAKE 1 TABLET BY MOUTH EVERY DAY IN THE MORNING 90 tablet 1    Levonorgest-Eth Estrad 91-Day 0.1-0.02 & 0.01 MG TABS TAKE 1 TABLET BY MOUTH EVERY DAY 91 tablet 2    traZODone (DESYREL) 150 MG tablet TAKE 1 TABLET BY MOUTH EVERY DAY AT NIGHT 90 tablet 1    hydrOXYzine (ATARAX) 25 MG tablet Take 1 tablet by mouth every 4 hours as needed for Anxiety 90 tablet 0    nadolol (CORGARD) 20 MG tablet TAKE 1 TABLET BY MOUTH EVERY DAY 30 tablet 1     No current facility-administered medications for this visit.       Review of Systems     Review of Systems   Constitutional:  Negative for chills, fatigue and fever.   HENT:  Negative for congestion, ear discharge, ear pain, facial swelling, hearing loss, nosebleeds, postnasal drip, rhinorrhea, sinus pressure, sinus pain, sneezing, sore throat, tinnitus, trouble  swallowing and voice change.    Eyes:  Negative for photophobia, pain, redness, itching and visual disturbance.   Respiratory:  Negative for cough, choking, shortness of breath and stridor.    Gastrointestinal:  Negative for diarrhea and nausea.   Musculoskeletal:  Negative for neck pain and neck stiffness.   Skin:  Negative for color change and rash.   Neurological:  Positive for dizziness. Negative for facial asymmetry and light-headedness.   Hematological:  Negative for adenopathy.   Psychiatric/Behavioral:  Negative for agitation and confusion.        PhysicalExam     Vitals:    04/25/21 1001   BP: 116/74   Pulse: 64   Temp: 98.5 F (36.9 C)   SpO2: 99%       Physical Exam  Constitutional:       Appearance: She is well-developed.   HENT:      Head: Normocephalic and atraumatic.      Jaw: No trismus.      Right Ear: Tympanic membrane, ear canal and external ear normal. No drainage. No middle ear effusion. Tympanic membrane is not perforated.      Left Ear: Tympanic membrane, ear canal and external ear normal. No drainage.  No middle ear effusion. Tympanic membrane is not perforated.      Nose: No septal deviation, mucosal edema or rhinorrhea.      Mouth/Throat:      Dentition: Normal dentition.      Pharynx: Uvula midline. No oropharyngeal exudate.   Eyes:      General: No scleral icterus.        Right eye: No discharge.         Left eye: No discharge.      Pupils: Pupils are equal, round,  and reactive to light.   Neck:      Thyroid: No thyromegaly.      Trachea: Phonation normal. No tracheal deviation.   Pulmonary:      Effort: Pulmonary effort is normal. No respiratory distress.      Breath sounds: No stridor.   Musculoskeletal:      Cervical back: Neck supple.   Lymphadenopathy:      Cervical: No cervical adenopathy.   Skin:     General: Skin is warm and dry.   Neurological:      Mental Status: She is alert and oriented to person, place, and time.      Cranial Nerves: No cranial nerve deficit.   Psychiatric:          Behavior: Behavior normal.         Procedure           Assessment and Plan     1. Vestibular migraine  We will continue 20 mg nadolol for the time being.  After the marathon, she is going to increase to 40 and let me know at that time.  We will assess symptom improvement, heart rate and blood pressure afterward.  She does not tolerate, may consider switching medications or adding back any nortriptyline.  The nadolol may counteract the tachycardia she was experiencing with nortriptyline.    2. Dizziness        Return in about 2 months (around 06/25/2021).      [ ]  Review/order radiology tests   [ ]  Independent interpretation of diagnostic test by another provider  [ ]  Discussed case with another provider  [ ]  High risk of loss of major body function  [ ]  Elective major surgery with risk factors    Portions of this note were dictated using Dragon. There may be linguistic errors secondary to the use of this program.

## 2021-05-15 ENCOUNTER — Encounter

## 2021-05-15 MED ORDER — NORTRIPTYLINE HCL 10 MG PO CAPS
10 MG | ORAL_CAPSULE | Freq: Every evening | ORAL | 1 refills | Status: DC
Start: 2021-05-15 — End: 2021-06-07

## 2021-05-15 NOTE — Telephone Encounter (Signed)
Routing to Dr. Vajen

## 2021-05-20 ENCOUNTER — Encounter

## 2021-05-20 MED ORDER — NADOLOL 20 MG PO TABS
20 MG | ORAL_TABLET | ORAL | 1 refills | Status: DC
Start: 2021-05-20 — End: 2021-06-20

## 2021-05-29 MED ORDER — LEVONORGEST-ETH ESTRAD 91-DAY 0.1-0.02 & 0.01 MG PO TABS
ORAL_TABLET | ORAL | 2 refills | Status: DC
Start: 2021-05-29 — End: 2021-09-12

## 2021-05-29 NOTE — Telephone Encounter (Signed)
Medication:   Requested Prescriptions     Pending Prescriptions Disp Refills    Levonorgest-Eth Estrad 91-Day 0.1-0.02 & 0.01 MG TABS [Pharmacy Med Name: LEVONOR-E ESTRAD 0.1-0.02-0.01] 91 tablet 2     Sig: TAKE 1 TABLET BY MOUTH EVERY DAY       Last Filled:  [id number] LM for patient to  call back for appointment    Patient Phone Number: [phone] (home)     Last appt: 11/01/2020   Next appt: Visit date not found    Last Thyroid:   Lab Results   Component Value Date/Time    T4FREE 1.3 11/01/2020 01:42 PM

## 2021-06-01 ENCOUNTER — Encounter

## 2021-06-04 NOTE — Telephone Encounter (Signed)
Called pt, no answer, LVMTCB

## 2021-06-04 NOTE — Telephone Encounter (Signed)
PATIENT NEEDS APPOINTMENT, LAST OV > 6 MO FOR REFILL        Medication:   Requested Prescriptions     Pending Prescriptions Disp Refills    traZODone (DESYREL) 150 MG tablet [Pharmacy Med Name: TRAZODONE 150 MG TABLET] 90 tablet 1     Sig: TAKE 1 TABLET BY MOUTH EVERY DAY AT NIGHT        Last Filled:  08/02/2020 #90 w/ 1 refill     Patient Phone Number: [phone] (home)     Last appt: 11/01/2020   Next appt: Visit date not found    Last OARRS: No flowsheet data found.

## 2021-06-06 ENCOUNTER — Encounter

## 2021-06-07 MED ORDER — NORTRIPTYLINE HCL 25 MG PO CAPS
25 MG | ORAL_CAPSULE | Freq: Every evening | ORAL | 1 refills | Status: DC
Start: 2021-06-07 — End: 2021-07-03

## 2021-06-07 NOTE — Telephone Encounter (Signed)
LOV 04-25-21    Routing to Dr. Vajen

## 2021-06-19 ENCOUNTER — Encounter

## 2021-06-20 MED ORDER — NADOLOL 20 MG PO TABS
20 MG | ORAL_TABLET | ORAL | 1 refills | Status: DC
Start: 2021-06-20 — End: 2021-07-17

## 2021-07-03 ENCOUNTER — Encounter

## 2021-07-03 MED ORDER — NORTRIPTYLINE HCL 25 MG PO CAPS
25 MG | ORAL_CAPSULE | ORAL | 1 refills | Status: DC
Start: 2021-07-03 — End: 2021-08-02

## 2021-07-03 NOTE — Telephone Encounter (Signed)
Routing to Dr. Vajen

## 2021-07-04 ENCOUNTER — Ambulatory Visit
Admit: 2021-07-04 | Discharge: 2021-07-04 | Payer: PRIVATE HEALTH INSURANCE | Attending: Otolaryngology | Primary: Family

## 2021-07-04 DIAGNOSIS — G43809 Other migraine, not intractable, without status migrainosus: Secondary | ICD-10-CM

## 2021-07-04 NOTE — Progress Notes (Signed)
[facility]  [address], [address]  [place], [place] [address]  P: [phone]  F: [phone]       Patient     Vickie Wallace  17-Mar-1990    ChiefComplaint     Chief Complaint   Patient presents with    Other     She is here to get a refill on her headache medicine.  She has been having some headaches and dizziness.       History of Present Illness     Vickie Wallace is a pleasant 31 y.o. female who presents for 66-month follow-up for vestibular migraine, dizziness, headache.  She has been taking nadolol and nortriptyline at nighttime.  She feels that there is some symptom improvement.  However she notices towards the evening before she takes her medications that the symptoms seem to worsen.  She experiences worsening dizziness at that time.  She does not want to go up on the nortriptyline as she feels like it causes some muscle fatigue.  She has not experienced any lightheadedness or syncope.  She has been experiencing some allergic rhinitis symptoms.  She takes Flonase as needed.  She has not taken Claritin recently    Past Medical History     Past Medical History:   Diagnosis Date    Anxiety     Depression     Dizziness     Headache     Insomnia        Past Surgical History     No past surgical history on file.    Family History     Family History   Problem Relation Age of Onset    No Known Problems Mother     Other Father     Heart Attack Father         heart 20    No Known Problems Brother     Diabetes Maternal Grandmother     Cancer Maternal Grandfather     Other Paternal Grandfather        Social History     Social History     Socioeconomic History    Marital status: Single     Spouse name: Not on file    Number of children: Not on file    Years of education: Not on file    Highest education level: Not on file   Occupational History    Not on file   Tobacco Use    Smoking status: Former     Years: 3.00     Types: Cigarettes     Quit date: 01/03/2021     Years since quitting:  0.4     Passive exposure: Current    Smokeless tobacco: Never    Tobacco comments:     Vapes nicotene daily   Vaping Use    Vaping Use: Every day    Substances: Nicotine, Flavoring    Devices: Pre-filled or refillable cartridge   Substance and Sexual Activity    Alcohol use: Not Currently     Comment: daily    Drug use: No    Sexual activity: Not Currently     Partners: Male   Other Topics Concern    Not on file   Social History Narrative    Not on file     Social Determinants of Health     Financial Resource Strain: Low Risk     Difficulty of Paying Living Expenses: Not hard at all   Food Insecurity:  No Food Insecurity    Worried About Programme researcher, broadcasting/film/video in the Last Year: Never true    Ran Out of Food in the Last Year: Never true   Transportation Needs: Not on file   Physical Activity: Sufficiently Active    Days of Exercise per Week: 6 days    Minutes of Exercise per Session: 40 min   Stress: Not on file   Social Connections: Not on file   Intimate Partner Violence: Not At Risk    Fear of Current or Ex-Partner: No    Emotionally Abused: No    Physically Abused: No    Sexually Abused: No   Housing Stability: Not on file       Allergies     No Known Allergies    Medications     Current Outpatient Medications   Medication Sig Dispense Refill    nortriptyline (PAMELOR) 25 MG capsule TAKE 1 CAPSULE BY MOUTH EVERY DAY AT NIGHT 30 capsule 1    nadolol (CORGARD) 20 MG tablet TAKE 1 TABLET BY MOUTH EVERY DAY 30 tablet 1    Levonorgest-Eth Estrad 91-Day 0.1-0.02 & 0.01 MG TABS TAKE 1 TABLET BY MOUTH EVERY DAY 91 tablet 2    traZODone (DESYREL) 150 MG tablet TAKE 1 TABLET BY MOUTH EVERY DAY AT NIGHT 90 tablet 1    hydrOXYzine (ATARAX) 25 MG tablet Take 1 tablet by mouth every 4 hours as needed for Anxiety 90 tablet 0     No current facility-administered medications for this visit.       Review of Systems     Review of Systems   Constitutional:  Negative for chills, fatigue and fever.   HENT:  Positive for rhinorrhea and  sneezing. Negative for congestion, ear discharge, ear pain, facial swelling, hearing loss, nosebleeds, postnasal drip, sinus pressure, sinus pain, sore throat, tinnitus, trouble swallowing and voice change.    Eyes:  Negative for photophobia, pain, redness, itching and visual disturbance.   Respiratory:  Negative for cough, choking, shortness of breath and stridor.    Gastrointestinal:  Negative for diarrhea and nausea.   Musculoskeletal:  Negative for neck pain and neck stiffness.   Skin:  Negative for color change and rash.   Neurological:  Positive for dizziness and headaches. Negative for facial asymmetry and light-headedness.   Hematological:  Negative for adenopathy.   Psychiatric/Behavioral:  Negative for agitation and confusion.        PhysicalExam     Vitals:    07/04/21 1052   BP: 130/86   Pulse: (!) 49   Temp: 98.3 F (36.8 C)       Physical Exam  Constitutional:       Appearance: She is well-developed.   HENT:      Head: Normocephalic and atraumatic.      Jaw: No trismus.      Right Ear: Tympanic membrane, ear canal and external ear normal. No drainage. No middle ear effusion. Tympanic membrane is not perforated.      Left Ear: Tympanic membrane, ear canal and external ear normal. No drainage.  No middle ear effusion. Tympanic membrane is not perforated.      Nose: No septal deviation, mucosal edema or rhinorrhea.      Mouth/Throat:      Dentition: Normal dentition.      Pharynx: Uvula midline. No oropharyngeal exudate.   Eyes:      General: No scleral icterus.        Right eye:  No discharge.         Left eye: No discharge.      Pupils: Pupils are equal, round, and reactive to light.   Neck:      Thyroid: No thyromegaly.      Trachea: Phonation normal. No tracheal deviation.   Pulmonary:      Effort: Pulmonary effort is normal. No respiratory distress.      Breath sounds: No stridor.   Musculoskeletal:      Cervical back: Neck supple.   Lymphadenopathy:      Cervical: No cervical adenopathy.   Skin:      General: Skin is warm and dry.   Neurological:      Mental Status: She is alert and oriented to person, place, and time.      Cranial Nerves: No cranial nerve deficit.   Psychiatric:         Behavior: Behavior normal.         Procedure           Assessment and Plan     1. Vestibular migraine  Given that symptoms seem to be worsening in the evening before she takes her medication doses, I recommend spreading out the medications and doing the nadolol in the morning.  She is can give this a 1 month trial before he try to increase the dosage.  Her heart rate today was 49.  Additionally, I recommend she take her nasal steroid and oral antihistamine daily as this can exacerbate symptoms.  We will assess improvement at follow-up    2. Dizziness      3. Allergic rhinitis, unspecified seasonality, unspecified trigger        Return in about 3 months (around 10/04/2021).      [ ]  Review/order radiology tests   [ ]  Independent interpretation of diagnostic test by another provider  [ ]  Discussed case with another provider  [ ]  High risk of loss of major body function  [ ]  Elective major surgery with risk factors    Portions of this note were dictated using Dragon. There may be linguistic errors secondary to the use of this program.

## 2021-07-17 ENCOUNTER — Encounter

## 2021-07-17 MED ORDER — NADOLOL 20 MG PO TABS
20 MG | ORAL_TABLET | ORAL | 1 refills | Status: AC
Start: 2021-07-17 — End: 2021-08-13

## 2021-08-01 ENCOUNTER — Encounter

## 2021-08-02 MED ORDER — NORTRIPTYLINE HCL 25 MG PO CAPS
25 MG | ORAL_CAPSULE | ORAL | 1 refills | Status: DC
Start: 2021-08-02 — End: 2021-08-19

## 2021-08-02 NOTE — Telephone Encounter (Signed)
Routing to Dr Vajen

## 2021-08-13 ENCOUNTER — Encounter

## 2021-08-13 MED ORDER — NADOLOL 20 MG PO TABS
20 MG | ORAL_TABLET | ORAL | 1 refills | Status: DC
Start: 2021-08-13 — End: 2021-09-12

## 2021-08-16 ENCOUNTER — Encounter

## 2021-08-19 MED ORDER — NORTRIPTYLINE HCL 25 MG PO CAPS
25 MG | ORAL_CAPSULE | Freq: Every evening | ORAL | 1 refills | Status: DC
Start: 2021-08-19 — End: 2021-08-22

## 2021-08-21 ENCOUNTER — Encounter

## 2021-08-22 ENCOUNTER — Encounter

## 2021-08-22 MED ORDER — TRAZODONE HCL 150 MG PO TABS
150 MG | ORAL_TABLET | ORAL | 1 refills | Status: DC
Start: 2021-08-22 — End: 2021-09-12

## 2021-08-22 MED ORDER — NORTRIPTYLINE HCL 25 MG PO CAPS
25 MG | ORAL_CAPSULE | Freq: Every evening | ORAL | 1 refills | Status: DC
Start: 2021-08-22 — End: 2021-09-12

## 2021-08-22 NOTE — Telephone Encounter (Signed)
LM for patent to call back for an appointment per CW

## 2021-08-22 NOTE — Telephone Encounter (Signed)
Patient is due for appointment end of oct beginning of November  please have her schedule

## 2021-09-10 NOTE — Telephone Encounter (Signed)
From: Vickie Wallace  To: Dr. Orlie Pollen  Sent: 09/10/2021 11:52 AM EDT  Subject: Nadolol    Good morning! I hope you are doing well and that you had a nice holiday weekend. I have been making a lot of prescription changes recently. I stopped taking the nortripyline about a month or so ago, I stopped taking my trazodone (used for sleep) within the past few weeks, and two weeks ago I decided to take a break from my birth control. I have seen a significant improvement in my dizziness since stopping my sleep medication so I am elated about that! I was curious if it would be ok to stop taking the Nadolol and see how I do? I really only like the Nadolol because it gets rid of my heart papiltations I was having, but I was curious now that I have basically nothing else going on in my system anymore, if the heart palpitations have also stopped. If not, I'd like to resume taking it but I was curious how safe it would be to just stop taking it since I am on the lowest milligram (I believe) and see how I feel. I am going for my physical with Vernia Buff this Thursday but   I wanted to check in with you since you wrote me the prescription. Please let me know!

## 2021-09-12 ENCOUNTER — Ambulatory Visit: Admit: 2021-09-12 | Discharge: 2021-09-12 | Payer: PRIVATE HEALTH INSURANCE | Attending: Family | Primary: Family

## 2021-09-12 DIAGNOSIS — Z0000 Encounter for general adult medical examination without abnormal findings: Secondary | ICD-10-CM

## 2021-09-12 NOTE — Patient Instructions (Addendum)
20-30 g of protein     Pastabilitis     1955 kcal --     Maintain:     Protein  119 grams/day  Carbs  Includes Sugar  261 grams/day  Fat  Includes Saturated Fat  56 grams/day    High protein   Protein  167 grams/day  Carbs  222 grams/day  Fat  50 grams/day

## 2021-09-12 NOTE — Progress Notes (Signed)
PROGRESS NOTE  Date of Service:  09/12/2021  Address: [facility]  [facility]  [address]  [place] [address]  Dept: [phone]  Loc: [phone]    Subjective:      Patient ID: [id number]  Vickie Wallace is a 31 y.o. female    HPI: patient is here for a physical. Patient said she stopped all her medications, she was feeling dizzy and recognized that her dizziness started when she started trazodone. Patient also stopped birth control which she has not has a normal cycle in a long time she wants to see how her cycle is.     Patient said might have felt swelling on the right side for her lymph nodes. She said her left tonsil has been irritated for the last 2 weeks. She denies increased fatigue.     Patient does exercise, patient ran the flying pig full. She is running queen bee.     Review of Systems   Constitutional:  Negative for diaphoresis, fatigue and fever.   Respiratory:  Negative for cough and shortness of breath.    Cardiovascular:  Negative for chest pain, palpitations and leg swelling.   Gastrointestinal:  Negative for blood in stool, constipation, diarrhea, nausea and vomiting.   Psychiatric/Behavioral:  Negative for self-injury and suicidal ideas. The patient is not nervous/anxious.    All other systems reviewed and are negative.  Objective:   Physical Exam  Vitals reviewed.   Constitutional:       Appearance: Normal appearance. She is well-developed.   HENT:      Head: Normocephalic and atraumatic.      Right Ear: Tympanic membrane, ear canal and external ear normal.      Left Ear: Tympanic membrane, ear canal and external ear normal.      Nose: Nose normal.      Mouth/Throat:      Pharynx: Uvula midline. No oropharyngeal exudate.   Eyes:      Conjunctiva/sclera: Conjunctivae normal.      Pupils: Pupils are equal, round, and reactive to light.   Neck:      Thyroid: No thyromegaly.   Cardiovascular:      Rate and Rhythm: Normal rate and  regular rhythm.      Pulses: Normal pulses.      Heart sounds: Normal heart sounds. No murmur heard.  Pulmonary:      Effort: Pulmonary effort is normal.      Breath sounds: Normal breath sounds.   Abdominal:      General: Abdomen is flat. Bowel sounds are normal.   Musculoskeletal:      Cervical back: Normal range of motion and neck supple.   Lymphadenopathy:      Cervical: Cervical adenopathy present.      Right cervical: Posterior cervical adenopathy present.   Skin:     General: Skin is warm.      Capillary Refill: Capillary refill takes less than 2 seconds.   Neurological:      General: No focal deficit present.      Mental Status: She is alert and oriented to person, place, and time.   Psychiatric:         Mood and Affect: Mood normal.         Behavior: Behavior normal.         Thought Content: Thought content normal.         Judgment: Judgment normal.  Plan:   1. Encounter for preventive care patient will get blood work fasting.  Spoke today with patient about nutrition calculate her macros for her running patient agrees to start trying to stick to these macros.  -     Mononucleosis Screen; Future  -     Lipid Panel; Future  2. Enlarged lymph nodes patient does have slight enlarged lymph node denies any other symptoms.  We will check blood work along with getting an ultrasound possibly in the near future patient will continue monitoring area.  -     CBC with Auto Differential; Future  -     C-Reactive Protein; Future             Electronically signed by Carma Lair, APRN - CNP on 09/12/21 at 1:28 PM EDT     This dictation was generated by voice recognition computer software. Although all attempts are made to edit the dictation for accuracy, there may be errors in the transcription that were not intended.

## 2021-09-14 LAB — LIPID PANEL
Cholesterol non HDL: 170
Cholesterol, Total: 263 mg/dL
HDL: 47 mg/dL (ref 35–70)
Triglycerides: 246 mg/dL
VLDL: 46 mg/dL

## 2021-09-14 LAB — CBC, EXTERNAL
HCT, EXTERNAL: 46
HGB, EXTERNAL: 15.3
MCH, EXTERNAL: 29.8
MCHC, EXTERNAL: 33.3
MCV, EXTERNAL: 90
NEUTROPHILS, EXTERNAL: 2.7
Platelets, External: 251
RBC, External: 5.13
RDW, EXTERNAL: 12.6
WBC, EXTERNAL: 5.2

## 2021-09-16 ENCOUNTER — Encounter

## 2021-09-16 NOTE — Telephone Encounter (Signed)
Please advise patient on procedure

## 2021-09-20 ENCOUNTER — Inpatient Hospital Stay: Admit: 2021-09-20 | Payer: PRIVATE HEALTH INSURANCE | Primary: Family

## 2021-09-20 DIAGNOSIS — R590 Localized enlarged lymph nodes: Secondary | ICD-10-CM

## 2021-09-20 DIAGNOSIS — R599 Enlarged lymph nodes, unspecified: Secondary | ICD-10-CM

## 2021-09-23 ENCOUNTER — Encounter: Payer: PRIVATE HEALTH INSURANCE | Primary: Family

## 2021-10-21 ENCOUNTER — Encounter

## 2021-11-04 ENCOUNTER — Encounter

## 2021-11-04 MED ORDER — DROSPIRENONE-ETHINYL ESTRADIOL 3-0.02 MG PO TABS
3-0.02 MG | PACK | Freq: Every day | ORAL | 11 refills | Status: DC
Start: 2021-11-04 — End: 2022-12-25

## 2021-11-04 NOTE — Telephone Encounter (Signed)
From: Vickie Millet Wallace  To: Randa Evens  Sent: 11/04/2021 10:27 AM EDT  Subject: Birth Control    Good morning! I hope you had a nice weekend.      I would like to go back on birth control. I would like something that specifically can help my acne so I did some research. I value your input if you have any suggestions, but I think something like Yaz (or a generic form of it) could help. I would like to be on an oral birth control that allows me to have a period every month instead of every three months like I had before. Please let me know your thoughts and if you'd like me to stop in for a visit to discuss.      Thanks!  [name]

## 2021-12-09 ENCOUNTER — Encounter
Admit: 2021-12-09 | Discharge: 2021-12-09 | Payer: PRIVATE HEALTH INSURANCE | Attending: Cardiovascular Disease | Primary: Family

## 2021-12-09 DIAGNOSIS — I499 Cardiac arrhythmia, unspecified: Secondary | ICD-10-CM

## 2021-12-09 NOTE — Progress Notes (Signed)
Subjective:      Patient ID: Vickie Wallace is a 31 y.o. female.    HPI referred for irregular HB/Lipid/FH.  Wants to estab cardiac care.  Active.  Exercises.  Running.  4-5x per week.  Exercising 1/2- hr.  Runs in marathons.  Notes HR 40s at times.  Nervous.  No chest pain.  No sob.  No syncope.  No pnd or orthopnea.  Wt stable. Dizziness. Noticeable to mild.      Past Medical History:   Diagnosis Date    Anxiety     Depression     Dizziness     Headache     Insomnia      No past surgical history on file.    Social History     Socioeconomic History    Marital status: Single     Spouse name: Not on file    Number of children: Not on file    Years of education: Not on file    Highest education level: Not on file   Occupational History    Not on file   Tobacco Use    Smoking status: Former     Years: 3     Types: Cigarettes     Quit date: 01/03/2021     Years since quitting: 0.9     Passive exposure: Current    Smokeless tobacco: Never    Tobacco comments:     Vapes nicotene daily   Vaping Use    Vaping Use: Every day    Substances: Nicotine, Flavoring    Devices: Pre-filled or refillable cartridge   Substance and Sexual Activity    Alcohol use: Not Currently     Comment: daily    Drug use: No    Sexual activity: Not Currently     Partners: Male   Other Topics Concern    Not on file   Social History Narrative    Not on file     Social Determinants of Health     Financial Resource Strain: Low Risk  (09/12/2021)    Overall Financial Resource Strain (CARDIA)     Difficulty of Paying Living Expenses: Not hard at all   Food Insecurity: Not on file (09/12/2021)   Transportation Needs: Unknown (09/12/2021)    PRAPARE - Therapist, art (Medical): Not on file     Lack of Transportation (Non-Medical): No   Physical Activity: Sufficiently Active (01/17/2021)    Exercise Vital Sign     Days of Exercise per Week: 6 days     Minutes of Exercise per Session: 40 min   Stress: Not on file   Social  Connections: Not on file   Intimate Partner Violence: Not At Risk (01/17/2021)    Humiliation, Afraid, Rape, and Kick questionnaire     Fear of Current or Ex-Partner: No     Emotionally Abused: No     Physically Abused: No     Sexually Abused: No   Housing Stability: Unknown (09/12/2021)    Housing Stability Vital Sign     Unable to Pay for Housing in the Last Year: Not on file     Number of Places Lived in the Last Year: Not on file     Unstable Housing in the Last Year: No     FH reviewed, Fa died mi 24.  GM with mi 50s.     Vitals:    12/09/21 1400   BP: 115/78   Pulse:  60   SpO2: 99%           Review of Systems   Constitutional:  Negative for activity change, appetite change and fatigue.   Respiratory:  Negative for cough, choking, chest tightness and shortness of breath.    Cardiovascular:  Positive for palpitations. Negative for chest pain and leg swelling.        Denies PND or orthopnea. No tachycardia or syncope.    Neurological:  Negative for dizziness, syncope and light-headedness.   Psychiatric/Behavioral:  Negative for agitation, behavioral problems and confusion.    All other systems reviewed and are negative.      Objective:   Physical Exam  Constitutional:       General: She is not in acute distress.     Appearance: Normal appearance. She is well-developed.   HENT:      Head: Normocephalic and atraumatic.      Right Ear: External ear normal.      Left Ear: External ear normal.      Nose: Nose normal.   Neck:      Vascular: No JVD.   Cardiovascular:      Rate and Rhythm: Normal rate and regular rhythm.      Heart sounds: Normal heart sounds. No murmur heard.     No gallop.   Pulmonary:      Effort: Pulmonary effort is normal. No respiratory distress.      Breath sounds: Normal breath sounds. No wheezing or rales.   Abdominal:      General: Bowel sounds are normal.      Palpations: Abdomen is soft.      Tenderness: There is no abdominal tenderness.   Musculoskeletal:         General: No swelling. Normal  range of motion.      Cervical back: Normal range of motion.   Skin:     General: Skin is warm and dry.   Neurological:      General: No focal deficit present.      Mental Status: She is alert and oriented to person, place, and time.   Psychiatric:         Behavior: Behavior normal.         Assessment:       Diagnosis Orders   1. Irregular heart rate  EKG 12 Lead              Plan:      Asymptomatic.  Exercises regularly. EKG today shows.SB, otherwise normal.  She exercises regularly.  Plain stress.  If ok will see annually.          Glennon Hamilton, MD

## 2021-12-10 ENCOUNTER — Encounter: Payer: PRIVATE HEALTH INSURANCE | Attending: Internal Medicine | Primary: Family

## 2021-12-10 NOTE — Progress Notes (Deleted)
[facility]     Outpatient Cardiology         Patient Name:  Vickie Wallace  Requesting Physician: No admitting provider for patient encounter.  Primary Care Physician: Randa Evens, APRN - CNP    Reason for Consultation/Chief Complaint:   No chief complaint on file.        History of Present Illness:    HPI     Vickie Decelles Rutherfordis a 31 y.o. female with PMH of anxiety, depression, orthostatic hypotension, dizziness, insomnia.   Here for family history of coronary artery disease.       PMH  Past Medical History:   Diagnosis Date    Anxiety     Depression     Dizziness     Headache     Insomnia        PSH  No past surgical history on file.     Social HIstory  Social History     Tobacco Use    Smoking status: Former     Years: 3     Types: Cigarettes     Quit date: 01/03/2021     Years since quitting: 0.8     Passive exposure: Current    Smokeless tobacco: Never    Tobacco comments:     Vapes nicotene daily   Vaping Use    Vaping Use: Every day    Substances: Nicotine, Flavoring    Devices: Pre-filled or refillable cartridge   Substance Use Topics    Alcohol use: Not Currently     Comment: daily    Drug use: No       Family History  Family History   Problem Relation Age of Onset    No Known Problems Mother     Other Father     Heart Attack Father         heart 64    No Known Problems Brother     Diabetes Maternal Grandmother     Cancer Maternal Grandfather     Other Paternal Grandfather        Allergies   No Known Allergies    Medications:     Home Medications:  Were reviewed and are listed in nursing record. and/or listed below    Prior to Admission medications    Medication Sig Start Date End Date Taking? Authorizing Provider   drospirenone-ethinyl estradiol (YAZ) 3-0.02 MG per tablet Take 1 tablet by mouth daily 11/04/21   Randa Evens, APRN - CNP   nortriptyline (PAMELOR) 25 MG capsule  09/15/21   [provider]        Review of Systems    There were no  vitals filed for this visit.         There were no vitals filed for this visit.    BP Readings from Last 3 Encounters:   09/12/21 114/70   07/04/21 130/86   04/25/21 116/74       Wt Readings from Last 3 Encounters:   09/12/21 60.8 kg (134 lb)   07/04/21 61.7 kg (136 lb)   04/25/21 62.6 kg (138 lb)       Physical Exam    Labs:       Lab Results   Component Value Date    WBC 6.1 11/01/2020    HGB 14.1 11/01/2020    HCT 40.4 11/01/2020    MCV 87.1 11/01/2020    PLT 243 11/01/2020     Lab Results  Component Value Date    NA 139 11/01/2020    K 4.9 11/01/2020    CL 101 11/01/2020    CO2 26 11/01/2020    BUN 13 11/01/2020    CREATININE 0.9 11/01/2020    GLUCOSE 90 11/01/2020    CALCIUM 9.6 11/01/2020    PROT 6.8 11/01/2020    LABALBU 4.6 11/01/2020    BILITOT <0.2 11/01/2020    ALKPHOS 43 11/01/2020    AST 14 (L) 11/01/2020    ALT 13 11/01/2020    LABGLOM >60 11/01/2020    GFRAA >60 04/01/2012    AGRATIO 2.1 11/01/2020         Lab Results   Component Value Date    CHOL 263 09/14/2021    CHOL 238 (H) 11/01/2020     Lab Results   Component Value Date    TRIG 246 09/14/2021    TRIG 203 (H) 11/01/2020     Lab Results   Component Value Date    HDL 47 09/14/2021    HDL 55 11/01/2020     Lab Results   Component Value Date    LDLCALC 142 (H) 11/01/2020     Lab Results   Component Value Date    LABVLDL 41 11/01/2020    VLDL 46 09/14/2021     No results found for: "CHOLHDLRATIO"    No results found for: "INR", "PROTIME"    The ASCVD Risk score (Arnett DK, et al., 2019) failed to calculate for the following reasons:    The 2019 ASCVD risk score is only valid for ages 32 to 72      Imaging:       Last ECG (if available, Personally interpreted):    Last Monitor/Holter (if available):    Last Stress (if available):    Last Cath (if available):    Last TTE/TEE(if available): 03/23/19  MPRESSIONS   1. Left ventricular ejection fraction, by estimation, is 60 to 65%. The left ventricle has normal function. The left ventricle has no  regional wall motion abnormalities. Left ventricular diastolic parameters were normal.    2. Right ventricular systolic function is normal. The right ventricular size is normal.    3. The mitral valve is normal in structure. No evidence of mitral valve regurgitation.    4. The aortic valve is tricuspid. Aortic valve regurgitation is not visualized.    5. The inferior vena cava is normal in size with greater than 50% respiratory variability, suggesting right atrial pressure of 3 mmHg.       Last CMR  (if available):    Last Coronary Artery Calcium Score:     Ankle-brachial index:    Carotid ultrasound screening:    Abdominal aortic aneurysm screening:       Assessment / Plan:     ***    Tobacco use was discussed with the patient and educated on the negative effects.I have asked the patient to not utilize these agents.    All questions and concerns were addressed to the patient/family. Alternatives to my treatment were discussed. The note was completed using EMR. Every effort wasmade to ensure accuracy; however, inadvertent computerized transcription errors may be present.    Thank you for allowing me to participate in thecare or Vickie Igou R. Shawnie Dapper, MD, [facility], [facility]  Advanced Cardiac Imaging  [facility]

## 2021-12-17 ENCOUNTER — Encounter: Payer: PRIVATE HEALTH INSURANCE | Primary: Family

## 2022-09-23 ENCOUNTER — Ambulatory Visit
Admit: 2022-09-23 | Discharge: 2022-09-23 | Payer: PRIVATE HEALTH INSURANCE | Attending: Family Medicine | Primary: Family Medicine

## 2022-09-23 VITALS — BP 126/88 | HR 60 | Temp 97.10000°F | Wt 141.1 lb

## 2022-09-23 DIAGNOSIS — M6289 Other specified disorders of muscle: Secondary | ICD-10-CM

## 2022-09-23 NOTE — Progress Notes (Signed)
 09/23/2022    Vickie Wallace (DOB:  Dec 14, 1990) is a 32 y.o. female, here for a preventive medicine evaluation.    Subjective   There is no problem list on file for this patient.    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    Feels like she is on a boat, and that if she stops walking she feels the ground is still moving.  Grocery stores or large crowds make it worse, looking into far distance makes her dizzy, not changed with position , will sometimes have ringing in ears that come and go.  Will sometimes feel numb on both sides of face maybe a couple times a week will last a couple hours at a time, and not associated with HA's. Has tried betablocker and didn't help, tried magnesium and coQ10 for a month and didn't help, also tried nortriptyline      Takes claritin daily for allergies     She's very athletic and competed in half iron man in July and since then she feels more muscle fatigue than usual, very athletic but feels very  heavy and unable to walk for short distance without body feeling like she ran a marathon. Gets some SHORTNESS OF BREATH.      Will feel a little more cold than usual    Loose BM's every day for 3 days and then will be fine for a couple days , no black or bloody stools, will have some normal BM's, will have BM 3 times a day.       Review of Systems   All other systems reviewed and are negative.      Prior to Visit Medications    Medication Sig Taking? Authorizing Provider   drospirenone -ethinyl estradiol  (YAZ) 3-0.02 MG per tablet Take 1 tablet by mouth daily Yes Jennifer Dorothyann HERO, APRN - CNP         No Known Allergies    Past Medical History:   Diagnosis Date    ADHD (attention deficit hyperactivity disorder) 11/2019    Anxiety     Depression     Dizziness     Headache     Insomnia     Substance abuse (HCC)        No past surgical history on file.    Social History     Socioeconomic History    Marital status: Single     Spouse name: Not on file    Number of children: Not on file    Years of education: Not on file    Highest education level: Not on file   Occupational History    Not on file   Tobacco Use    Smoking status: Former     Current packs/day: 0.00     Types: E-Cigarettes, Cigarettes     Start date: 03/17/2017     Quit date: 01/02/2022     Years since quitting: 0.7     Passive exposure: Current    Smokeless tobacco: Never    Tobacco comments:     Vapes nicotene  daily   Vaping Use    Vaping status: Every Day    Substances: Nicotine, Flavoring    Devices: Pre-filled or refillable cartridge   Substance and Sexual Activity    Alcohol use: Not Currently     Comment: daily    Drug use: No    Sexual activity: Yes     Partners: Male   Other Topics Concern    Not on file   Social History Narrative    Not on file     Social Determinants of Health     Financial Resource Strain: Low Risk  (09/23/2022)    Overall Financial Resource Strain (CARDIA)     Difficulty of Paying Living Expenses: Not hard at all   Food Insecurity: No Food Insecurity (09/23/2022)    Hunger Vital Sign     Worried About Running Out of Food in the Last Year: Never true     Ran Out of Food in the Last Year: Never true   Transportation Needs: Unknown (09/23/2022)    PRAPARE - Therapist, Art (Medical): Not on file     Lack of Transportation (Non-Medical): No   Physical Activity: Sufficiently Active (09/20/2022)    Exercise Vital Sign     Days of Exercise per Week: 5 days     Minutes of Exercise per Session: 60 min   Stress: Stress Concern Present (11/09/2018)    Received from [facility], [facility]    [facility]  [facility] Occupational [facility] - Occupational Stress Questionnaire     Feeling of Stress : Very much   Social Connections: Unknown (11/09/2018)    Received from [facility], [facility]    Social Connection and Isolation Panel [NHANES]     Frequency of Communication with Friends and Family: More than three times a week     Frequency of Social Gatherings with Friends and Family: Never     Attends Religious Services: Never     Database Administrator or Organizations: No     Attends Banker Meetings: Never     Marital Status: Not on file   Intimate Partner Violence: Not At Risk (01/17/2021)    Humiliation, Afraid, Rape, and Kick questionnaire     Fear of Current or Ex-Partner: No     Emotionally Abused: No     Physically Abused: No     Sexually Abused: No   Housing Stability: Unknown (09/23/2022)    Housing Stability Vital Sign     Unable to Pay for Housing in the Last Year: Not on file     Number of Times Moved in the Last Year: Not on file     Homeless in the Last Year: No        Family History   Problem Relation Age of Onset    Mental Illness Mother     Other Father     Heart Attack Father         heart 80    High Cholesterol Father     No Known Problems Brother     Diabetes Maternal Grandmother     Cancer Maternal Grandfather     Other Paternal Grandfather        ADVANCE DIRECTIVE: N, <no information>    Vitals:    09/23/22 1327   BP: 126/88   Site: Left Upper Arm   Position: Sitting   Cuff Size: Medium Adult   Pulse: 60   Temp: 97.1 F (  36.2 C)   SpO2: 98%   Weight: 64 kg (141 lb 0.8 oz)     Estimated body mass index is 23.47 kg/m as calculated from the following:    Height as of 12/09/21: 1.651 m (5' 5).    Weight as of this encounter: 64 kg (141 lb 0.8 oz).       Objective   Physical Exam  Vitals reviewed.   Constitutional:       General: She is not in acute distress.     Appearance: Normal appearance. She is not ill-appearing.   HENT:      Head: Normocephalic and atraumatic.      Right Ear: Tympanic  membrane, ear canal and external ear normal.      Left Ear: Tympanic membrane, ear canal and external ear normal.      Nose: Nose normal. No rhinorrhea.      Mouth/Throat:      Mouth: Mucous membranes are moist.      Pharynx: Oropharynx is clear. No oropharyngeal exudate.   Eyes:      General: No scleral icterus.        Right eye: No discharge.         Left eye: No discharge.      Extraocular Movements: Extraocular movements intact.      Conjunctiva/sclera: Conjunctivae normal.   Cardiovascular:      Rate and Rhythm: Normal rate and regular rhythm.      Pulses: Normal pulses.      Heart sounds: Normal heart sounds. No murmur heard.     No gallop.   Pulmonary:      Effort: Pulmonary effort is normal.      Breath sounds: Normal breath sounds. No wheezing, rhonchi or rales.   Abdominal:      General: Abdomen is flat. Bowel sounds are normal. There is no distension.      Palpations: Abdomen is soft. There is no mass.   Musculoskeletal:         General: Normal range of motion.      Cervical back: Neck supple. No muscular tenderness.   Lymphadenopathy:      Cervical: No cervical adenopathy.   Skin:     General: Skin is warm.      Capillary Refill: Capillary refill takes less than 2 seconds.      Findings: No rash.   Neurological:      General: No focal deficit present.      Mental Status: She is alert.      Cranial Nerves: No cranial nerve deficit.      Sensory: No sensory deficit.      Motor: No weakness.      Coordination: Coordination normal.      Gait: Gait normal.      Deep Tendon Reflexes: Reflexes normal.      Comments: Normal rapid alternating movements  Normal heel-to-shin bilaterally  All cranial nerves grossly intact  DTRs within normal limits bilaterally  5/5 strength upper and lower extremities and grip strength 5/5       Psychiatric:         Mood and Affect: Mood normal.         Behavior: Behavior normal.             Latest Ref Rng & Units 09/14/2021    12:00 AM 11/01/2020     1:42 PM 04/01/2012     4:00 PM    LAB [facility]   GLU random  70 - 99 mg/dL  90  866    CHOL mg/dL 736     761     TRIG mg/dL 753     796     HDL 35 - 70 mg/dL 47     55     LDL CALC <100 mg/dL  857     NA 863 - 854 mmol/L  139  137    K 3.5 - 5.1 mmol/L  4.9  3.6    BUN 7 - 20 mg/dL  13  15    CR 0.6 - 1.1 mg/dL  0.9  0.8    GFR >39  >39     CA 8.3 - 10.6 mg/dL  9.6  8.9    ALT 10 - 40 U/L  13  13    AST 15 - 37 U/L  14  14    TSH 0.27 - 4.20 uIU/mL  4.31     HGB 12.0 - 16.0 g/dL  85.8  85.3        This result is from an external source.       Lab Results   Component Value Date/Time    CHOL 263 09/14/2021 12:00 AM    CHOL 238 11/01/2020 01:42 PM    TRIG 246 09/14/2021 12:00 AM    TRIG 203 11/01/2020 01:42 PM    HDL 47 09/14/2021 12:00 AM    HDL 55 11/01/2020 01:42 PM    GLUCOSE 90 11/01/2020 01:42 PM       The ASCVD Risk score (Arnett DK, et al., 2019) failed to calculate for the following reasons:    The 2019 ASCVD risk score is only valid for ages 2 to 40      There is no immunization history on file for this patient.    Health Maintenance   Topic Date Due    Hepatitis C screen  Never done    Hepatitis B vaccine (1 of 3 - 19+ 3-dose series) Never done    DTaP/Tdap/Td vaccine (1 - Tdap) Never done    Cervical cancer screen  Never done    Flu vaccine (1) Never done    COVID-19 Vaccine (1 - 2023-24 season) Never done    Depression Screen  09/13/2022    HIV screen  Completed    Hepatitis A vaccine  Aged Out    Hib vaccine  Aged Out    HPV vaccine  Aged Out    Polio vaccine  Aged Out    Meningococcal (ACWY) vaccine  Aged Out    Pneumococcal 0-64 years Vaccine  Aged Out    Varicella vaccine  Discontinued    Depression Monitoring  Discontinued           Assessment & Plan  Muscle fatigue       Orders:    CBC with Auto Differential; Future    TSH with Reflex to FT4 ([place] Only); Future    THYROID PEROXIDASE ANTIBODY; Future    Comprehensive Metabolic Panel; Future    CK; Future    Vitamin D 25 Hydroxy; Future    C-Reactive Protein; Future     Sedimentation Rate; Future    ANA; Future    Urinalysis with Reflex to Culture; Future    Vitamin B12 & Folate; Future    Ferritin; Future    Iron and TIBC; Future    Hypothyroidism, unspecified type       Orders:    CBC with Auto Differential; Future  TSH with Reflex to FT4 ([place] Only); Future    THYROID PEROXIDASE ANTIBODY; Future    Comprehensive Metabolic Panel; Future    CK; Future    Vitamin D 25 Hydroxy; Future    C-Reactive Protein; Future    Sedimentation Rate; Future    ANA; Future    Urinalysis with Reflex to Culture; Future    Vitamin B12 & Folate; Future    Ferritin; Future    Iron and TIBC; Future    Lightheaded       Orders:    Cortisol AM, Total; Future    Mixed hyperlipidemia       Orders:    Lipid Panel; Future    Hyperglycemia       Orders:    Hemoglobin A1C; Future      No follow-ups on file.           --Leotis Birk, MD

## 2022-09-24 ENCOUNTER — Encounter

## 2022-09-24 LAB — CBC WITH AUTO DIFFERENTIAL
Basophils %: 0.9 %
Basophils Absolute: 0 10*3/uL (ref 0.0–0.2)
Eosinophils %: 2.8 %
Eosinophils Absolute: 0.1 10*3/uL (ref 0.0–0.6)
Hematocrit: 41.9 % (ref 36.0–48.0)
Hemoglobin: 14.6 g/dL (ref 12.0–16.0)
Lymphocytes %: 42.6 %
Lymphocytes Absolute: 2.1 10*3/uL (ref 1.0–5.1)
MCH: 30.3 pg (ref 26.0–34.0)
MCHC: 34.7 g/dL (ref 31.0–36.0)
MCV: 87.2 fL (ref 80.0–100.0)
MPV: 9.6 fL (ref 5.0–10.5)
Monocytes %: 6.7 %
Monocytes Absolute: 0.3 10*3/uL (ref 0.0–1.3)
Neutrophils %: 47 %
Neutrophils Absolute: 2.4 10*3/uL (ref 1.7–7.7)
Platelets: 232 10*3/uL (ref 135–450)
RBC: 4.81 M/uL (ref 4.00–5.20)
RDW: 12.5 % (ref 12.4–15.4)
WBC: 5 10*3/uL (ref 4.0–11.0)

## 2022-09-24 LAB — SEDIMENTATION RATE: Sed Rate, Automated: 4 mm/h (ref 0–20)

## 2022-09-24 LAB — VITAMIN B12 & FOLATE
Folate: 27.5 ng/mL — ABNORMAL HIGH (ref 4.78–24.20)
Vitamin B-12: 688 pg/mL (ref 211–911)

## 2022-09-24 LAB — VITAMIN D 25 HYDROXY: Vit D, 25-Hydroxy: 52.8 ng/mL (ref >=30)

## 2022-09-24 LAB — THYROID PEROXIDASE ANTIBODY: Thyroid Peroxidase (TPO) Abs: 180 [IU]/mL — ABNORMAL HIGH (ref <34)

## 2022-09-25 ENCOUNTER — Ambulatory Visit
Admit: 2022-09-25 | Discharge: 2022-09-25 | Payer: PRIVATE HEALTH INSURANCE | Attending: Family Medicine | Primary: Family Medicine

## 2022-09-25 DIAGNOSIS — Z0000 Encounter for general adult medical examination without abnormal findings: Secondary | ICD-10-CM

## 2022-09-25 LAB — COMPREHENSIVE METABOLIC PANEL
ALT: 16 U/L (ref 10–40)
AST: 18 U/L (ref 15–37)
Albumin/Globulin Ratio: 1.8 (ref 1.1–2.2)
Albumin: 4.4 g/dL (ref 3.4–5.0)
Alkaline Phosphatase: 38 U/L — ABNORMAL LOW (ref 40–129)
Anion Gap: 12 (ref 3–16)
BUN: 16 mg/dL (ref 7–20)
CO2: 23 mmol/L (ref 21–32)
Calcium: 9.3 mg/dL (ref 8.3–10.6)
Chloride: 104 mmol/L (ref 99–110)
Creatinine: 1 mg/dL (ref 0.6–1.1)
Est, Glom Filt Rate: 76 (ref >60)
Glucose: 93 mg/dL (ref 70–99)
Potassium: 4 mmol/L (ref 3.5–5.1)
Sodium: 139 mmol/L (ref 136–145)
Total Bilirubin: 0.4 mg/dL (ref 0.0–1.0)
Total Protein: 6.9 g/dL (ref 6.4–8.2)

## 2022-09-25 LAB — LIPID PANEL
Cholesterol, Total: 255 mg/dL — ABNORMAL HIGH (ref 0–199)
HDL: 56 mg/dL (ref 40–60)
LDL Cholesterol: 158 mg/dL — ABNORMAL HIGH (ref <100)
Triglycerides: 206 mg/dL — ABNORMAL HIGH (ref 0–150)
VLDL Cholesterol Calculated: 41 mg/dL

## 2022-09-25 LAB — FERRITIN: Ferritin: 166 ng/mL — ABNORMAL HIGH (ref 15.0–150.0)

## 2022-09-25 LAB — CORTISOL AM, TOTAL: Cortisol - AM: 31.6 ug/dL — ABNORMAL HIGH (ref 4.3–22.4)

## 2022-09-25 LAB — C-REACTIVE PROTEIN: CRP: <3 mg/L (ref 0.0–5.1)

## 2022-09-25 LAB — IRON AND TIBC
Iron % Saturation: 42 % (ref 15–50)
Iron: 120 ug/dL (ref 37–145)
TIBC: 285 ug/dL (ref 260–445)

## 2022-09-25 LAB — TSH REFLEX TO FT4, T3: TSH Reflex FT4: 4.06 u[IU]/mL (ref 0.27–4.20)

## 2022-09-25 LAB — ANA: ANA: NEGATIVE

## 2022-09-25 LAB — HEMOGLOBIN A1C
Estimated Avg Glucose: 96.8 mg/dL
Hemoglobin A1C: 5 %

## 2022-09-25 LAB — CK: Total CK: 62 U/L (ref 26–192)

## 2022-09-25 MED ORDER — LEVOTHYROXINE SODIUM 25 MCG PO TABS
25 MCG | ORAL_TABLET | Freq: Every day | ORAL | 0 refills | Status: DC
Start: 2022-09-25 — End: 2022-11-03

## 2022-09-25 NOTE — Progress Notes (Signed)
 09/25/2022    Vickie Wallace (DOB:  Jun 01, 1990) is a 32 y.o. female, here for a preventive medicine evaluation.    Subjective   There is no problem list on file for this patient.    Menses: has had maybe 2-3 periods in past 2 years, has been oin bc pills the entire time though     See recent note for other history .       Review of Systems   All other systems reviewed and are negative.      Prior to Visit Medications    Medication Sig Taking? Authorizing Provider   levothyroxine  (SYNTHROID ) 25 MCG tablet Take 1 tablet by mouth daily Yes Emerson Leach, MD   drospirenone -ethinyl estradiol  (YAZ) 3-0.02 MG per tablet Take 1 tablet by mouth daily Yes Jennifer Dorothyann HERO, APRN - CNP        No Known Allergies    Past Medical History:   Diagnosis Date    ADHD (attention deficit hyperactivity disorder) 11/2019    Anxiety     Depression     Dizziness     Headache     Insomnia     Substance abuse (HCC)        No past surgical history on file.    Social History     Socioeconomic History    Marital status: Single     Spouse name: Not on file    Number of children: Not on file    Years of education: Not on file    Highest education level: Not on file   Occupational History    Not on file   Tobacco Use    Smoking status: Former     Current packs/day: 0.00     Types: E-Cigarettes, Cigarettes     Start date: 03/17/2017     Quit date: 01/02/2022     Years since quitting: 0.7     Passive exposure: Current    Smokeless tobacco: Never    Tobacco comments:     Vapes nicotene daily   Vaping Use    Vaping status: Every Day    Substances: Nicotine, Flavoring    Devices: Pre-filled or refillable cartridge   Substance and Sexual Activity    Alcohol use: Not Currently     Comment: daily    Drug use: No    Sexual activity: Yes     Partners: Male   Other Topics Concern    Not on file   Social History Narrative    Not on file     Social Determinants of Health     Financial Resource Strain: Low Risk  (09/23/2022)    Overall Financial  Resource Strain (CARDIA)     Difficulty of Paying Living Expenses: Not hard at all   Food Insecurity: No Food Insecurity (09/23/2022)    Hunger Vital Sign     Worried About Running Out of Food in the Last Year: Never true     Ran Out of Food in the Last Year: Never true   Transportation Needs: Unknown (09/23/2022)    PRAPARE - Therapist, Art (Medical): Not on file     Lack of Transportation (Non-Medical): No   Physical Activity: Sufficiently Active (09/20/2022)    Exercise Vital Sign     Days of Exercise per Week: 5 days     Minutes of Exercise per Session: 60 min   Stress: Stress Concern Present (11/09/2018)    Received from [facility], [facility]    [employer] of Occupational Health - Occupational Stress Questionnaire     Feeling of Stress : Very much   Social Connections: Unknown (11/09/2018)    Received from [facility], [facility]    Social Connection and Isolation Panel [NHANES]     Frequency of Communication with Friends and Family: More than three times a week     Frequency of Social Gatherings with Friends and Family: Never     Attends Religious Services: Never     Database Administrator or Organizations: No     Attends Banker Meetings: Never     Marital Status: Not on file   Intimate Partner Violence: Not At Risk (01/17/2021)    Humiliation, Afraid, Rape, and Kick questionnaire     Fear of Current or Ex-Partner: No     Emotionally Abused: No     Physically Abused: No     Sexually Abused: No   Housing Stability: Unknown (09/23/2022)    Housing Stability Vital Sign     Unable to Pay for Housing in the Last Year: Not on file     Number of Times Moved in the Last Year: Not on file     Homeless in the Last Year: No        Family History   Problem Relation Age of Onset    Mental Illness Mother     Other Father     Heart Attack Father         heart 52    High Cholesterol Father     No Known Problems Brother     Diabetes Maternal Grandmother     Cancer Maternal  Grandfather     Other Paternal Grandfather        ADVANCE DIRECTIVE: N, <no information>    Vitals:    09/25/22 1331   BP: 118/76   Site: Right Upper Arm   Position: Sitting   Cuff Size: Medium Adult   Pulse: 55   Temp: 97.3 F (36.3 C)   SpO2: 98%   Weight: 64 kg (141 lb 0.2 oz)     Estimated body mass index is 23.47 kg/m as calculated from the following:    Height as of 12/09/21: 1.651 m (5' 5).    Weight as of this encounter: 64 kg (141 lb 0.2 oz).       Objective   Physical Exam  Vitals reviewed.   Constitutional:       General: She is not in acute distress.     Appearance: Normal appearance. She is not ill-appearing.   HENT:      Head: Normocephalic and atraumatic.      Right Ear: Tympanic membrane, ear canal and external ear normal.      Left Ear: Tympanic membrane, ear canal and external ear normal.      Nose: Nose normal. No rhinorrhea.      Mouth/Throat:      Mouth: Mucous membranes are moist.      Pharynx: Oropharynx is clear. No oropharyngeal exudate.   Eyes:      General: No scleral icterus.        Right eye: No discharge.         Left eye: No discharge.      Extraocular Movements: Extraocular movements intact.      Conjunctiva/sclera: Conjunctivae normal.   Cardiovascular:      Rate and Rhythm: Normal rate and regular rhythm.  Pulses: Normal pulses.      Heart sounds: Normal heart sounds. No murmur heard.     No gallop.   Pulmonary:      Effort: Pulmonary effort is normal.      Breath sounds: Normal breath sounds. No wheezing, rhonchi or rales.   Abdominal:      General: Abdomen is flat. Bowel sounds are normal. There is no distension.      Palpations: Abdomen is soft. There is no mass.   Musculoskeletal:         General: Normal range of motion.      Cervical back: Neck supple. No muscular tenderness.   Lymphadenopathy:      Cervical: No cervical adenopathy.   Skin:     General: Skin is warm.      Capillary Refill: Capillary refill takes less than 2 seconds.      Findings: No rash.   Neurological:       General: No focal deficit present.      Mental Status: She is alert.      Cranial Nerves: No cranial nerve deficit.   Psychiatric:         Mood and Affect: Mood normal.         Behavior: Behavior normal.             Latest Ref Rng & Units 09/24/2022     7:44 AM 09/14/2021    12:00 AM 11/01/2020     1:42 PM   LAB PRIMARY CARE   A1C See comment % 5.0      A1C POC See comment % 5.0      GLU random 70 - 99 mg/dL 93   90    CHOL 0 - 800 mg/dL 744  736     761    TRIG 0 - 150 mg/dL 793  753     796    HDL 40 - 60 mg/dL 56  47     55    LDL CALC <100 mg/dL 841   857    NA 863 - 854 mmol/L 139   139    K 3.5 - 5.1 mmol/L 4.0   4.9    BUN 7 - 20 mg/dL 16   13    CR 0.6 - 1.1 mg/dL 1.0   0.9    GFR >39 76   >60    CA 8.3 - 10.6 mg/dL 9.3   9.6    ALT 10 - 40 U/L 16   13    AST 15 - 37 U/L 18   14    TSH 0.27 - 4.20 uIU/mL   4.31    HGB 12.0 - 16.0 g/dL 85.3   85.8        This result is from an external source.       Lab Results   Component Value Date/Time    CHOL 255 09/24/2022 07:44 AM    CHOL 263 09/14/2021 12:00 AM    CHOL 238 11/01/2020 01:42 PM    TRIG 206 09/24/2022 07:44 AM    TRIG 246 09/14/2021 12:00 AM    TRIG 203 11/01/2020 01:42 PM    HDL 56 09/24/2022 07:44 AM    HDL 47 09/14/2021 12:00 AM    HDL 55 11/01/2020 01:42 PM    GLUCOSE 93 09/24/2022 07:44 AM    LABA1C 5.0 09/24/2022 07:44 AM       The ASCVD Risk score (Arnett DK, et al., 2019) failed to  calculate for the following reasons:    The 2019 ASCVD risk score is only valid for ages 47 to 1      There is no immunization history on file for this patient.    Health Maintenance   Topic Date Due    Hepatitis C screen  Never done    Hepatitis B vaccine (1 of 3 - 19+ 3-dose series) Never done    DTaP/Tdap/Td vaccine (1 - Tdap) Never done    Cervical cancer screen  Never done    Flu vaccine (1) Never done    COVID-19 Vaccine (1 - 2023-24 season) Never done    Depression Screen  09/23/2023    HIV screen  Completed    Hepatitis A vaccine  Aged Out    Hib vaccine   Aged Out    HPV vaccine  Aged Out    Polio vaccine  Aged Out    Meningococcal (ACWY) vaccine  Aged Out    Pneumococcal 0-64 years Vaccine  Aged Out    Varicella vaccine  Discontinued    Depression Monitoring  Discontinued           Assessment & Plan  Encounter for annual physical exam     General wellness exam. Reviewed chart for past hx and updated today. Counseled on age appropriate health guidance and discussed screening recommendations. Vaccinations reviewed and discussed. All questions answered         Hashimoto's disease     New diagnosis  Discussed Hashimoto's disease, all questions answered  Start medication, recheck labs in 6 weeks  Orders:    levothyroxine  (SYNTHROID ) 25 MCG tablet; Take 1 tablet by mouth daily    T4, Free; Future    TSH; Future    T3; Future      Return in 6 weeks (on 11/06/2022), or if symptoms worsen or fail to improve.           --Leotis Birk, MD

## 2022-11-01 ENCOUNTER — Encounter

## 2022-11-03 MED ORDER — LEVOTHYROXINE SODIUM 25 MCG PO TABS
25 | ORAL_TABLET | Freq: Every day | ORAL | 1 refills | Status: DC
Start: 2022-11-03 — End: 2023-10-14

## 2022-11-03 NOTE — Telephone Encounter (Signed)
Medication:   Requested Prescriptions     Pending Prescriptions Disp Refills    levothyroxine (SYNTHROID) 25 MCG tablet [Pharmacy Med Name: LEVOTHYROXINE 25 MCG TABLET] 90 tablet 1     Sig: TAKE 1 TABLET BY MOUTH EVERY DAY     Last Filled:  09/25/2022    Last appt: 09/25/2022   Next appt: Visit date not found    Last Thyroid:   Lab Results   Component Value Date/Time    TSH 4.31 11/01/2020 01:42 PM    T4FREE 1.3 11/01/2020 01:42 PM

## 2022-12-25 ENCOUNTER — Encounter: Admit: 2022-12-25 | Admitting: Family

## 2022-12-25 DIAGNOSIS — L700 Acne vulgaris: Secondary | ICD-10-CM

## 2022-12-25 MED ORDER — VESTURA 3-0.02 MG PO TABS
3-0.02 | ORAL_TABLET | Freq: Every day | ORAL | 11 refills | Status: DC
Start: 2022-12-25 — End: 2023-10-28

## 2023-08-07 ENCOUNTER — Encounter

## 2023-08-11 NOTE — Telephone Encounter (Signed)
 Have patient get fasting labs done and then follow-up in clinic so we can go over the results and see about next steps  Schedule her for her annual physical

## 2023-08-12 NOTE — Telephone Encounter (Signed)
 Spoke with the patient she did have a refill of 25mcg dose of Levothyroxine  at the pharmacy.  Dr. Emerson states she can go ahead and take that Pt. will get labs and set up appointment through my chart.SABRA

## 2023-10-08 ENCOUNTER — Ambulatory Visit
Admit: 2023-10-08 | Discharge: 2023-10-08 | Payer: PRIVATE HEALTH INSURANCE | Attending: Family Medicine | Primary: Family Medicine

## 2023-10-08 VITALS — BP 118/72 | HR 64 | Ht 65.0 in | Wt 133.0 lb

## 2023-10-08 DIAGNOSIS — Z00129 Encounter for routine child health examination without abnormal findings: Principal | ICD-10-CM

## 2023-10-08 NOTE — Progress Notes (Signed)
 10/08/2023    Vickie Wallace (DOB:  January 13, 1990) is a 33 y.o. female, here for a preventive medicine evaluation.    Subjective   There is no problem list on file for this patient.    Fatigue for 2 months straight but wasn't sleeping well but taking magnesium now and it is helping went though a break up recently and stressed from.  Cold intolerance     Started synthroid  2 months ago and feels a little better     Exercise: 5 days a week  Diet: pretty healthy   Mood: ok     Menses: Patient's last menstrual period was 09/18/2023 (approximate).    Review of Systems   All other systems reviewed and are negative.      Prior to Visit Medications   Medication Sig Taking? Authorizing Provider   levothyroxine  (SYNTHROID ) 25 MCG tablet TAKE 1 TABLET BY MOUTH EVERY DAY Yes Pilar Westergaard, MD   VESTURA  3-0.02 MG per tablet TAKE 1 TABLET BY MOUTH EVERY DAY  Patient not taking: Reported on 10/08/2023  Emerson Leach, MD        No Known Allergies    Past Medical History:   Diagnosis Date    ADHD (attention deficit hyperactivity disorder) 11/2019    Anxiety     Depression     Dizziness     Headache     Insomnia     Substance abuse (HCC)        No past surgical history on file.    Social History     Socioeconomic History    Marital status: Single     Spouse name: Not on file    Number of children: Not on file    Years of education: Not on file    Highest education level: Not on file   Occupational History    Not on file   Tobacco Use    Smoking status: Former     Current packs/day: 0.00     Types: E-Cigarettes, Cigarettes     Start date: 03/17/2017     Quit date: 01/02/2022     Years since quitting: 1.7     Passive exposure: Current    Smokeless tobacco: Never    Tobacco comments:     Vapes nicotene daily   Vaping Use    Vaping status: Every Day    Substances: Nicotine, Flavoring    Devices: Pre-filled or refillable cartridge   Substance and Sexual Activity    Alcohol use: Not Currently     Comment: daily    Drug use: No     Sexual activity: Yes     Partners: Male   Other Topics Concern    Not on file   Social History Narrative    Not on file     Social Drivers of Health     Financial Resource Strain: Low Risk  (09/23/2022)    Overall Financial Resource Strain (CARDIA)     Difficulty of Paying Living Expenses: Not hard at all   Food Insecurity: No Food Insecurity (10/08/2023)    Hunger Vital Sign     Worried About Running Out of Food in the Last Year: Never true     Ran Out of Food in the Last Year: Never true   Transportation Needs: No Transportation Needs (10/08/2023)    PRAPARE - Therapist, art (Medical): No     Lack of Transportation (Non-Medical): No   Physical Activity: Sufficiently Active (  09/20/2022)    Exercise Vital Sign     Days of Exercise per Week: 5 days     Minutes of Exercise per Session: 60 min   Stress: Stress Concern Present (11/09/2018)    Received from [facility] of Occupational Health - Occupational Stress Questionnaire     Feeling of Stress : Very much   Social Connections: Unknown (11/09/2018)    Received from [facility]    Social Connection and Isolation Panel     In a typical week, how many times do you talk on the phone with family, friends, or neighbors?: More than three times a week     How often do you get together with friends or relatives?: Never     How often do you attend church or religious services?: Never     Do you belong to any clubs or organizations such as church groups, unions, fraternal or athletic groups, or school groups?: No     How often do you attend meetings of the clubs or organizations you belong to?: Never     Marital Status: Not on file   Intimate Partner Violence: Not At Risk (01/17/2021)    Humiliation, Afraid, Rape, and Kick questionnaire     Fear of Current or Ex-Partner: No     Emotionally Abused: No     Physically Abused: No     Sexually Abused: No   Housing Stability: Low Risk  (10/08/2023)    Housing Stability Vital Sign     Unable to  Pay for Housing in the Last Year: No     Number of Times Moved in the Last Year: 0     Homeless in the Last Year: No        Family History   Problem Relation Age of Onset    Mental Illness Mother     Other Father     Heart Attack Father         heart 34    High Cholesterol Father     No Known Problems Brother     Diabetes Maternal Grandmother     Cancer Maternal Grandfather     Other Paternal Grandfather        ADVANCE DIRECTIVE: N, <no information>    Vitals:    10/08/23 1311   BP: 118/72   BP Site: Right Upper Arm   Patient Position: Sitting   BP Cuff Size: Small Adult   Pulse: 64   SpO2: 97%   Weight: 60.3 kg (133 lb)   Height: 1.651 m (5' 5)     Estimated body mass index is 22.13 kg/m as calculated from the following:    Height as of this encounter: 1.651 m (5' 5).    Weight as of this encounter: 60.3 kg (133 lb).       Objective   Physical Exam  Vitals reviewed.   Constitutional:       General: She is not in acute distress.     Appearance: Normal appearance. She is not ill-appearing.   HENT:      Head: Normocephalic and atraumatic.      Right Ear: Tympanic membrane, ear canal and external ear normal.      Left Ear: Tympanic membrane, ear canal and external ear normal.      Nose: Nose normal. No rhinorrhea.      Mouth/Throat:      Mouth: Mucous membranes are moist.      Pharynx: Oropharynx  is clear. No oropharyngeal exudate.   Eyes:      General: No scleral icterus.        Right eye: No discharge.         Left eye: No discharge.      Extraocular Movements: Extraocular movements intact.      Conjunctiva/sclera: Conjunctivae normal.   Cardiovascular:      Rate and Rhythm: Normal rate and regular rhythm.      Pulses: Normal pulses.      Heart sounds: Normal heart sounds. No murmur heard.     No gallop.   Pulmonary:      Effort: Pulmonary effort is normal.      Breath sounds: Normal breath sounds. No wheezing, rhonchi or rales.   Chest:      Chest wall: No mass.   Breasts:     Right: Normal.      Left: Normal.    Abdominal:      General: Abdomen is flat. Bowel sounds are normal. There is no distension.      Palpations: Abdomen is soft. There is no mass.   Genitourinary:     Exam position: Knee-chest position.      Pubic Area: No rash.       Labia:         Right: No rash or lesion.         Left: No rash or lesion.       Vagina: Normal.      Cervix: Normal.      Uterus: Normal.       Adnexa: Right adnexa normal and left adnexa normal.   Musculoskeletal:         General: Normal range of motion.      Cervical back: Neck supple. No muscular tenderness.   Lymphadenopathy:      Cervical: No cervical adenopathy.      Upper Body:      Right upper body: No supraclavicular, axillary or pectoral adenopathy.      Left upper body: No supraclavicular, axillary or pectoral adenopathy.   Skin:     General: Skin is warm.      Capillary Refill: Capillary refill takes less than 2 seconds.      Findings: No rash.   Neurological:      General: No focal deficit present.      Mental Status: She is alert.      Cranial Nerves: No cranial nerve deficit.   Psychiatric:         Mood and Affect: Mood normal.         Behavior: Behavior normal.       Chaperone for Intimate Exam  Chaperone was offered as part of the rooming process. Patient declined and agrees to continue with exam without a chaperone.              Latest Ref Rng & Units 09/24/2022     7:44 AM 09/14/2021    12:00 AM 11/01/2020     1:42 PM   LAB PRIMARY CARE   A1C See comment % 5.0      A1C POC See comment % 5.0      GLU random 70 - 99 mg/dL 93   90    CHOL 0 - 800 mg/dL 744  736     761    TRIG 0 - 150 mg/dL 793  753     796    HDL 40 - 60 mg/dL 56  47  55    LDL CALC <100 mg/dL 841   857    NA 863 - 854 mmol/L 139   139    K 3.5 - 5.1 mmol/L 4.0   4.9    BUN 7 - 20 mg/dL 16   13    CR 0.6 - 1.1 mg/dL 1.0   0.9    GFR >39 76   >60    CA 8.3 - 10.6 mg/dL 9.3   9.6    ALT 10 - 40 U/L 16   13    AST 15 - 37 U/L 18   14    TSH 0.27 - 4.20 uIU/mL   4.31    HGB 12.0 - 16.0 g/dL 85.3   85.8         This result is from an external source.       Lab Results   Component Value Date/Time    CHOL 255 09/24/2022 07:44 AM    CHOL 263 09/14/2021 12:00 AM    CHOL 238 11/01/2020 01:42 PM    TRIG 206 09/24/2022 07:44 AM    TRIG 246 09/14/2021 12:00 AM    TRIG 203 11/01/2020 01:42 PM    HDL 56 09/24/2022 07:44 AM    HDL 47 09/14/2021 12:00 AM    HDL 55 11/01/2020 01:42 PM    GLUCOSE 93 09/24/2022 07:44 AM    LABA1C 5.0 09/24/2022 07:44 AM       The ASCVD Risk score (Arnett DK, et al., 2019) failed to calculate for the following reasons:    The 2019 ASCVD risk score is only valid for ages 35 to 32      There is no immunization history on file for this patient.    Health Maintenance   Topic Date Due    Hepatitis C screen  Never done    Hepatitis B vaccine (1 of 3 - 19+ 3-dose series) Never done    DTaP/Tdap/Td vaccine (1 - Tdap) Never done    Cervical cancer screen  Never done    Flu vaccine (1) Never done    COVID-19 Vaccine (1 - 2024-25 season) Never done    Depression Screen  09/23/2023    HPV vaccine (No Doses Required) Completed    HIV screen  Completed    Hepatitis A vaccine  Aged Out    Hib vaccine  Aged Out    Polio vaccine  Aged Out    Meningococcal (ACWY) vaccine  Aged Out    Meningococcal B vaccine  Aged Out    Pneumococcal 0-49 years Vaccine  Aged Out    Varicella vaccine  Discontinued    Depression Monitoring  Discontinued           Assessment & Plan  Encounter for routine child health examination without abnormal findings     General wellness exam. Reviewed chart for past hx and updated today. Counseled on age appropriate health guidance and discussed screening recommendations. Vaccinations reviewed and discussed. All questions answered    Orders:    PAP SMEAR    Screening for cervical cancer       Orders:    PAP SMEAR    Hypothyroidism, unspecified type     Check labs  Goal would be have a TSH between 1 and 2  Will titrate Synthroid  as needed         Return if symptoms worsen or fail to improve, for Annual  Physical.           --  Leotis Birk, MD

## 2023-10-08 NOTE — Patient Instructions (Signed)
 I do not know who is covered under your insurance but here are a few resources you can try to get an appointment.        Psychology Today  Go to website to look for a therapist  [email or web address]  This is great because you can put in insurance info and it will show all therapists that take your insurance and you can read the bio's and see which one would be a good fit.      Dr Maureen Ralphs  [email or web address]  [facility]  [address]  [address]  [place], [place] [address]  [phone]      [facility]  [email or web address]  Phone:: [phone]  Fax: [phone]  [address], [place], [place] [address]      [facility]  [email or web address]  [address]   [place], [place] [address]       [phone]    [facility]  [address], [place], [place] [address]  [phone]  [email or web address]

## 2023-10-09 ENCOUNTER — Encounter

## 2023-10-10 LAB — HUMAN PAPILLOMAVIRUS (HPV) DNA PROBE THIN PREP HIGH RISK
HPV Genotype 16: NOT DETECTED
HPV Type 18: NOT DETECTED
HPVOH (Other Types): NOT DETECTED

## 2023-10-10 LAB — COMPREHENSIVE METABOLIC PANEL
ALT: 20 U/L (ref 10–40)
AST: 18 U/L (ref 15–37)
Albumin/Globulin Ratio: 1.8 (ref 1.1–2.2)
Albumin: 4.6 g/dL (ref 3.4–5.0)
Alkaline Phosphatase: 55 U/L (ref 40–129)
Anion Gap: 11 (ref 3–16)
BUN: 17 mg/dL (ref 7–20)
CO2: 25 mmol/L (ref 21–32)
Calcium: 9.5 mg/dL (ref 8.3–10.6)
Chloride: 102 mmol/L (ref 99–110)
Creatinine: 0.9 mg/dL (ref 0.6–1.1)
Est, Glom Filt Rate: 86
Glucose: 93 mg/dL (ref 70–99)
Potassium: 4.2 mmol/L (ref 3.5–5.1)
Sodium: 138 mmol/L (ref 136–145)
Total Bilirubin: 0.6 mg/dL (ref 0.0–1.0)
Total Protein: 7.1 g/dL (ref 6.4–8.2)

## 2023-10-10 LAB — TSH REFLEX TO FT4, T3: TSH: 3.26 u[IU]/mL (ref 0.27–4.20)

## 2023-10-10 LAB — CBC WITH AUTO DIFFERENTIAL
Basophils %: 1.1 %
Basophils Absolute: 0 K/uL (ref 0.0–0.2)
Eosinophils %: 5.4 %
Eosinophils Absolute: 0.2 K/uL (ref 0.0–0.6)
Hematocrit: 41.1 % (ref 36.0–48.0)
Hemoglobin: 14.4 g/dL (ref 12.0–16.0)
Lymphocytes %: 40.2 %
Lymphocytes Absolute: 1.6 K/uL (ref 1.0–5.1)
MCH: 30.8 pg (ref 26.0–34.0)
MCHC: 34.9 g/dL (ref 31.0–36.0)
MCV: 88.3 fL (ref 80.0–100.0)
MPV: 9.8 fL (ref 5.0–10.5)
Monocytes %: 9.4 %
Monocytes Absolute: 0.4 K/uL (ref 0.0–1.3)
Neutrophils %: 43.9 %
Neutrophils Absolute: 1.8 K/uL (ref 1.7–7.7)
Platelets: 222 K/uL (ref 135–450)
RBC: 4.66 M/uL (ref 4.00–5.20)
RDW: 13.3 % (ref 12.4–15.4)
WBC: 4 K/uL (ref 4.0–11.0)

## 2023-10-10 LAB — LIPID PANEL
Cholesterol, Total: 230 mg/dL — ABNORMAL HIGH (ref 0–199)
HDL: 55 mg/dL (ref 40–60)
LDL Cholesterol: 148 mg/dL — ABNORMAL HIGH (ref <100)
Triglycerides: 134 mg/dL (ref 0–150)
VLDL Cholesterol Calculated: 27 mg/dL

## 2023-10-14 ENCOUNTER — Encounter

## 2023-10-14 MED ORDER — LEVOTHYROXINE SODIUM 25 MCG PO TABS
25 | ORAL_TABLET | Freq: Every day | ORAL | 1 refills | 90.00000 days | Status: DC
Start: 2023-10-14 — End: 2023-10-31

## 2023-10-19 ENCOUNTER — Encounter

## 2023-10-26 ENCOUNTER — Encounter

## 2023-10-26 NOTE — Telephone Encounter (Signed)
 Patient is no longer my patient.

## 2023-10-26 NOTE — Telephone Encounter (Signed)
"    Medication:   Requested Prescriptions     Pending Prescriptions Disp Refills    drospirenone -ethinyl estradiol  (VESTURA ) 3-0.02 MG per tablet 84 tablet 11     Sig: Take 1 tablet by mouth daily        Last Filled:   12/25/2022 #84 with 11 refills    Patient Phone Number: [phone] (home)     Last appt: 09/12/2021   Next appt: Visit date not found    Last OARRS:        No data to display                  "

## 2023-10-27 ENCOUNTER — Encounter

## 2023-10-28 MED ORDER — DROSPIRENONE-ETHINYL ESTRADIOL 3-0.02 MG PO TABS
3-0.02 | ORAL_TABLET | Freq: Every day | ORAL | 11 refills | Status: DC
Start: 2023-10-28 — End: 2023-11-10

## 2023-10-28 NOTE — Telephone Encounter (Signed)
"  Medication:   Requested Prescriptions     Pending Prescriptions Disp Refills    drospirenone -ethinyl estradiol  (VESTURA ) 3-0.02 MG per tablet 84 tablet 11     Sig: Take 1 tablet by mouth daily     Last Filled:  12/25/2022    Last appt: 10/08/2023   Next appt:   Return if symptoms worsen or fail to improve    Last OARRS:        No data to display              "

## 2023-10-28 NOTE — Addendum Note (Signed)
"  Addended by: RUSH RONNALD QUANT on: 10/28/2023 08:27 AM     Modules accepted: Orders    "

## 2023-10-31 ENCOUNTER — Encounter

## 2023-11-02 MED ORDER — LEVOTHYROXINE SODIUM 25 MCG PO TABS
25 | ORAL_TABLET | Freq: Every day | ORAL | 1 refills | 90.00000 days | Status: AC
Start: 2023-11-02 — End: ?

## 2023-11-02 NOTE — Telephone Encounter (Signed)
"  This needs sent into the pharmacy pended below.   "

## 2023-11-10 ENCOUNTER — Ambulatory Visit: Admit: 2023-11-10 | Payer: PRIVATE HEALTH INSURANCE | Attending: Family Medicine | Primary: Family Medicine

## 2023-11-10 VITALS — BP 104/60 | HR 69 | Temp 97.60000°F | Ht 65.0 in | Wt 135.0 lb

## 2023-11-10 DIAGNOSIS — Z3041 Encounter for surveillance of contraceptive pills: Principal | ICD-10-CM

## 2023-11-10 MED ORDER — NORETHINDRONE 0.35 MG PO TABS
0.35 | ORAL_TABLET | Freq: Every day | ORAL | 2 refills | Status: AC
Start: 2023-11-10 — End: ?

## 2023-11-10 NOTE — Telephone Encounter (Signed)
"  Patient needs to be seen. Sent my-chart message with ticket to schedule.   "

## 2023-11-10 NOTE — Progress Notes (Signed)
"      Vickie Wallace (DOB:  September 01, 1990) is a 33 y.o. female,Established patient, here for evaluation of the following chief complaint(s):  Follow-up (Patient would like to discuss birth control. )         Assessment & Plan  Encounter for birth control pills maintenance     We did discuss Mirena versus Micronor , we opted for the Micronor  for now she may look into the Mirena to see if this is something she would like to do.    Orders:    norethindrone  (MICRONOR ) 0.35 MG tablet; Take 1 tablet by mouth daily      Return if symptoms worsen or fail to improve.       Subjective   HPI  Patient has been on birth control for many years and feels like her mood is very cyclical with her periods but that the combination pills seem to increase her anxiety.  She is interested in progestin only pill    Review of Systems   All other systems reviewed and are negative.         Objective   Physical Exam  Vitals reviewed.   Constitutional:       General: She is not in acute distress.     Appearance: Normal appearance. She is not ill-appearing.   HENT:      Head: Normocephalic and atraumatic.      Right Ear: External ear normal.      Left Ear: External ear normal.   Eyes:      General: No scleral icterus.        Right eye: No discharge.         Left eye: No discharge.      Extraocular Movements: Extraocular movements intact.      Conjunctiva/sclera: Conjunctivae normal.   Pulmonary:      Effort: Pulmonary effort is normal. No respiratory distress.   Musculoskeletal:      Cervical back: Neck supple.   Skin:     Coloration: Skin is not jaundiced.      Findings: No lesion or rash.   Neurological:      Mental Status: She is alert.      Cranial Nerves: No cranial nerve deficit.      Coordination: Coordination normal.   Psychiatric:         Mood and Affect: Mood normal.                  An electronic signature was used to authenticate this note.    --Leotis Birk, MD   "

## 2023-11-10 NOTE — Telephone Encounter (Signed)
 Patient scheduled appointment.

## 2024-02-02 ENCOUNTER — Encounter

## 2024-02-02 MED ORDER — SCOPOLAMINE 1 MG/3DAYS TD PT72
1 | MEDICATED_PATCH | TRANSDERMAL | 0 refills | Status: AC
Start: 2024-02-02 — End: ?
